# Patient Record
Sex: Male | Born: 1945
Health system: Southern US, Community
[De-identification: ages and names within clinical notes are randomized; demographics above are authoritative.]

## PROBLEM LIST (undated history)

## (undated) DIAGNOSIS — E785 Hyperlipidemia, unspecified: Secondary | ICD-10-CM

## (undated) DIAGNOSIS — T7840XA Allergy, unspecified, initial encounter: Secondary | ICD-10-CM

## (undated) DIAGNOSIS — L570 Actinic keratosis: Secondary | ICD-10-CM

## (undated) DIAGNOSIS — C801 Malignant (primary) neoplasm, unspecified: Secondary | ICD-10-CM

## (undated) DIAGNOSIS — C61 Malignant neoplasm of prostate: Secondary | ICD-10-CM

## (undated) DIAGNOSIS — L719 Rosacea, unspecified: Secondary | ICD-10-CM

## (undated) DIAGNOSIS — K5730 Diverticulosis of large intestine without perforation or abscess without bleeding: Secondary | ICD-10-CM

## (undated) DIAGNOSIS — Z9289 Personal history of other medical treatment: Secondary | ICD-10-CM

## (undated) DIAGNOSIS — M25561 Pain in right knee: Secondary | ICD-10-CM

## (undated) DIAGNOSIS — K219 Gastro-esophageal reflux disease without esophagitis: Secondary | ICD-10-CM

## (undated) DIAGNOSIS — H269 Unspecified cataract: Secondary | ICD-10-CM

## (undated) HISTORY — DX: Diverticulosis of large intestine without perforation or abscess without bleeding: K57.30

## (undated) HISTORY — PX: RADIOACTIVE SEED IMPLANT: SHX5150

## (undated) HISTORY — PX: COLONOSCOPY: SHX174

## (undated) HISTORY — DX: Rosacea, unspecified: L71.9

## (undated) HISTORY — PX: POLYPECTOMY: SHX149

## (undated) HISTORY — DX: Gastro-esophageal reflux disease without esophagitis: K21.9

## (undated) HISTORY — DX: Hyperlipidemia, unspecified: E78.5

## (undated) HISTORY — PX: APPENDECTOMY: SHX54

## (undated) HISTORY — PX: TONSILLECTOMY: SUR1361

## (undated) HISTORY — DX: Unspecified cataract: H26.9

## (undated) HISTORY — DX: Allergy, unspecified, initial encounter: T78.40XA

## (undated) HISTORY — DX: Actinic keratosis: L57.0

## (undated) HISTORY — DX: Pain in right knee: M25.561

## (undated) HISTORY — DX: Malignant neoplasm of prostate: C61

## (undated) HISTORY — PX: CATARACT EXTRACTION: SUR2

## (undated) HISTORY — DX: Personal history of other medical treatment: Z92.89

---

## 1984-09-24 HISTORY — PX: LUMBAR SPINE SURGERY: SHX701

## 1985-04-24 DIAGNOSIS — Z9289 Personal history of other medical treatment: Secondary | ICD-10-CM

## 1985-04-24 HISTORY — DX: Personal history of other medical treatment: Z92.89

## 1994-06-08 ENCOUNTER — Encounter: Payer: Self-pay | Admitting: Family Medicine

## 1994-06-08 LAB — CONVERTED CEMR LAB
Blood Glucose, Fasting: 85 mg/dL
RBC count: 5.32 10*6/uL
WBC, blood: 5.7 10*3/uL

## 1995-02-07 ENCOUNTER — Encounter: Payer: Self-pay | Admitting: Family Medicine

## 1995-02-07 LAB — CONVERTED CEMR LAB: PSA: 0.7 ng/mL

## 1997-01-27 ENCOUNTER — Encounter: Payer: Self-pay | Admitting: Family Medicine

## 1997-01-27 LAB — CONVERTED CEMR LAB: Blood Glucose, Fasting: 97 mg/dL

## 1999-01-23 DIAGNOSIS — K5730 Diverticulosis of large intestine without perforation or abscess without bleeding: Secondary | ICD-10-CM

## 1999-01-23 HISTORY — DX: Diverticulosis of large intestine without perforation or abscess without bleeding: K57.30

## 1999-01-26 ENCOUNTER — Encounter: Payer: Self-pay | Admitting: Family Medicine

## 1999-01-26 LAB — CONVERTED CEMR LAB: Blood Glucose, Fasting: 100 mg/dL

## 2000-05-24 ENCOUNTER — Encounter: Payer: Self-pay | Admitting: Family Medicine

## 2000-05-24 LAB — CONVERTED CEMR LAB
Blood Glucose, Fasting: 101 mg/dL
PSA: 0.8 ng/mL

## 2000-11-14 ENCOUNTER — Encounter: Payer: Self-pay | Admitting: Family Medicine

## 2000-11-14 LAB — CONVERTED CEMR LAB
Blood Glucose, Fasting: 88 mg/dL
RBC count: 4.82 10*6/uL
TSH: 1.64 microintl units/mL
WBC, blood: 5.7 10*3/uL

## 2001-05-01 ENCOUNTER — Encounter: Payer: Self-pay | Admitting: Family Medicine

## 2001-05-01 LAB — CONVERTED CEMR LAB
Blood Glucose, Fasting: 95 mg/dL
PSA: 0.7 ng/mL

## 2002-05-20 ENCOUNTER — Encounter: Payer: Self-pay | Admitting: Family Medicine

## 2002-05-20 LAB — CONVERTED CEMR LAB
Blood Glucose, Fasting: 102 mg/dL
PSA: 0.6 ng/mL
RBC count: 5.09 10*6/uL
TSH: 1.52 microintl units/mL
WBC, blood: 4.9 10*3/uL

## 2003-05-21 ENCOUNTER — Encounter: Payer: Self-pay | Admitting: Family Medicine

## 2003-05-21 LAB — CONVERTED CEMR LAB
Blood Glucose, Fasting: 101 mg/dL
PSA: 0.7 ng/mL
TSH: 1.36 microintl units/mL

## 2004-08-28 ENCOUNTER — Ambulatory Visit: Payer: Self-pay | Admitting: Family Medicine

## 2004-08-28 LAB — CONVERTED CEMR LAB
Blood Glucose, Fasting: 99 mg/dL
PSA: 0.81 ng/mL
TSH: 1.23 microintl units/mL

## 2004-08-30 ENCOUNTER — Ambulatory Visit: Payer: Self-pay | Admitting: Family Medicine

## 2005-04-25 ENCOUNTER — Ambulatory Visit: Payer: Self-pay | Admitting: Family Medicine

## 2005-07-03 ENCOUNTER — Ambulatory Visit: Payer: Self-pay | Admitting: Family Medicine

## 2005-09-03 ENCOUNTER — Ambulatory Visit: Payer: Self-pay | Admitting: Family Medicine

## 2005-09-03 LAB — CONVERTED CEMR LAB
Blood Glucose, Fasting: 95 mg/dL
PSA: 1.06 ng/mL
RBC count: 4.99 10*6/uL
TSH: 0.92 microintl units/mL
WBC, blood: 5.8 10*3/uL

## 2005-09-05 ENCOUNTER — Ambulatory Visit: Payer: Self-pay | Admitting: Family Medicine

## 2005-09-20 ENCOUNTER — Ambulatory Visit: Payer: Self-pay | Admitting: Family Medicine

## 2005-10-05 ENCOUNTER — Ambulatory Visit (HOSPITAL_BASED_OUTPATIENT_CLINIC_OR_DEPARTMENT_OTHER): Admission: RE | Admit: 2005-10-05 | Discharge: 2005-10-05 | Payer: Self-pay | Admitting: Surgery

## 2005-10-05 HISTORY — PX: UMBILICAL HERNIA REPAIR: SHX196

## 2005-10-15 ENCOUNTER — Ambulatory Visit: Payer: Self-pay | Admitting: Family Medicine

## 2006-09-06 ENCOUNTER — Ambulatory Visit: Payer: Self-pay | Admitting: Family Medicine

## 2006-09-06 LAB — CONVERTED CEMR LAB
Blood Glucose, Fasting: 96 mg/dL
PSA: 0.93 ng/mL
TSH: 1.6 microintl units/mL

## 2006-09-11 ENCOUNTER — Ambulatory Visit: Payer: Self-pay | Admitting: Family Medicine

## 2006-09-23 ENCOUNTER — Ambulatory Visit: Payer: Self-pay

## 2006-09-26 ENCOUNTER — Ambulatory Visit: Payer: Self-pay | Admitting: Family Medicine

## 2006-11-15 ENCOUNTER — Ambulatory Visit: Payer: Self-pay | Admitting: Family Medicine

## 2007-09-05 ENCOUNTER — Encounter: Payer: Self-pay | Admitting: Family Medicine

## 2007-09-05 DIAGNOSIS — L719 Rosacea, unspecified: Secondary | ICD-10-CM

## 2007-09-05 DIAGNOSIS — E7800 Pure hypercholesterolemia, unspecified: Secondary | ICD-10-CM

## 2007-09-08 ENCOUNTER — Ambulatory Visit: Payer: Self-pay | Admitting: Family Medicine

## 2007-09-08 DIAGNOSIS — K5730 Diverticulosis of large intestine without perforation or abscess without bleeding: Secondary | ICD-10-CM | POA: Insufficient documentation

## 2007-09-08 LAB — CONVERTED CEMR LAB
ALT: 26 units/L (ref 0–53)
AST: 19 units/L (ref 0–37)
Albumin: 3.9 g/dL (ref 3.5–5.2)
Alkaline Phosphatase: 75 units/L (ref 39–117)
BUN: 12 mg/dL (ref 6–23)
Bilirubin, Direct: 0.1 mg/dL (ref 0.0–0.3)
CO2: 29 meq/L (ref 19–32)
Calcium: 9.3 mg/dL (ref 8.4–10.5)
Chloride: 103 meq/L (ref 96–112)
Cholesterol: 224 mg/dL (ref 0–200)
Creatinine, Ser: 0.8 mg/dL (ref 0.4–1.5)
Direct LDL: 158.5 mg/dL
GFR calc Af Amer: 127 mL/min
GFR calc non Af Amer: 105 mL/min
Glucose, Bld: 96 mg/dL (ref 70–99)
HDL: 47.7 mg/dL (ref >39.0)
PSA: 1.11 ng/mL (ref 0.10–4.00)
Potassium: 4 meq/L (ref 3.5–5.1)
Sodium: 140 meq/L (ref 135–145)
TSH: 1.57 microintl units/mL (ref 0.35–5.50)
Total Bilirubin: 1.1 mg/dL (ref 0.3–1.2)
Total CHOL/HDL Ratio: 4.7
Total Protein: 6.5 g/dL (ref 6.0–8.3)
Triglycerides: 86 mg/dL (ref 0–149)
VLDL: 17 mg/dL (ref 0–40)

## 2007-09-15 ENCOUNTER — Ambulatory Visit: Payer: Self-pay | Admitting: Family Medicine

## 2007-12-23 ENCOUNTER — Ambulatory Visit: Payer: Self-pay | Admitting: Family Medicine

## 2007-12-23 LAB — CONVERTED CEMR LAB
OCCULT 1: NEGATIVE
OCCULT 2: NEGATIVE
OCCULT 3: NEGATIVE

## 2007-12-23 LAB — FECAL OCCULT BLOOD, GUAIAC: Fecal Occult Blood: NEGATIVE

## 2007-12-24 ENCOUNTER — Encounter (INDEPENDENT_AMBULATORY_CARE_PROVIDER_SITE_OTHER): Payer: Self-pay | Admitting: *Deleted

## 2008-03-30 ENCOUNTER — Ambulatory Visit: Payer: Self-pay | Admitting: Family Medicine

## 2008-05-10 ENCOUNTER — Encounter: Payer: Self-pay | Admitting: Family Medicine

## 2008-07-07 ENCOUNTER — Ambulatory Visit: Payer: Self-pay | Admitting: Family Medicine

## 2008-09-13 ENCOUNTER — Ambulatory Visit: Payer: Self-pay | Admitting: Family Medicine

## 2008-09-13 LAB — CONVERTED CEMR LAB
ALT: 29 units/L (ref 0–53)
AST: 21 units/L (ref 0–37)
Albumin: 4 g/dL (ref 3.5–5.2)
Alkaline Phosphatase: 82 units/L (ref 39–117)
BUN: 14 mg/dL (ref 6–23)
Basophils Absolute: 0 10*3/uL (ref 0.0–0.1)
Basophils Relative: 0.7 % (ref 0.0–3.0)
Bilirubin, Direct: 0.1 mg/dL (ref 0.0–0.3)
CO2: 31 meq/L (ref 19–32)
Calcium: 9.1 mg/dL (ref 8.4–10.5)
Chloride: 105 meq/L (ref 96–112)
Cholesterol: 222 mg/dL (ref 0–200)
Creatinine, Ser: 0.8 mg/dL (ref 0.4–1.5)
Direct LDL: 158.3 mg/dL
Eosinophils Absolute: 0.1 10*3/uL (ref 0.0–0.7)
Eosinophils Relative: 1.7 % (ref 0.0–5.0)
GFR calc Af Amer: 126 mL/min
GFR calc non Af Amer: 104 mL/min
Glucose, Bld: 105 mg/dL — ABNORMAL HIGH (ref 70–99)
HCT: 47.2 % (ref 39.0–52.0)
HDL: 52.1 mg/dL (ref >39.0)
Hemoglobin: 16.5 g/dL (ref 13.0–17.0)
Lymphocytes Relative: 21 % (ref 12.0–46.0)
MCHC: 35 g/dL (ref 30.0–36.0)
MCV: 96.4 fL (ref 78.0–100.0)
Monocytes Absolute: 0.6 10*3/uL (ref 0.1–1.0)
Monocytes Relative: 9.2 % (ref 3.0–12.0)
Neutro Abs: 4.2 10*3/uL (ref 1.4–7.7)
Neutrophils Relative %: 67.4 % (ref 43.0–77.0)
PSA: 1.16 ng/mL (ref 0.10–4.00)
Platelets: 156 10*3/uL (ref 150–400)
Potassium: 4.3 meq/L (ref 3.5–5.1)
RBC: 4.9 M/uL (ref 4.22–5.81)
RDW: 11.9 % (ref 11.5–14.6)
Sodium: 140 meq/L (ref 135–145)
TSH: 1.5 microintl units/mL (ref 0.35–5.50)
Total Bilirubin: 1 mg/dL (ref 0.3–1.2)
Total CHOL/HDL Ratio: 4.3
Total Protein: 6.5 g/dL (ref 6.0–8.3)
Triglycerides: 70 mg/dL (ref 0–149)
VLDL: 14 mg/dL (ref 0–40)
WBC: 6.2 10*3/uL (ref 4.5–10.5)

## 2008-09-22 ENCOUNTER — Ambulatory Visit: Payer: Self-pay | Admitting: Family Medicine

## 2008-09-22 DIAGNOSIS — R7309 Other abnormal glucose: Secondary | ICD-10-CM | POA: Insufficient documentation

## 2008-10-25 ENCOUNTER — Ambulatory Visit: Payer: Self-pay | Admitting: Family Medicine

## 2008-11-04 ENCOUNTER — Ambulatory Visit: Payer: Self-pay | Admitting: Family Medicine

## 2008-11-04 LAB — CONVERTED CEMR LAB
ALT: 25 units/L (ref 0–53)
AST: 18 units/L (ref 0–37)

## 2008-12-15 ENCOUNTER — Ambulatory Visit: Payer: Self-pay | Admitting: Family Medicine

## 2008-12-15 LAB — CONVERTED CEMR LAB
ALT: 23 units/L (ref 0–53)
AST: 18 units/L (ref 0–37)
Cholesterol: 160 mg/dL (ref 0–200)
HDL: 44.6 mg/dL (ref >39.00)
LDL Cholesterol: 100 mg/dL — ABNORMAL HIGH (ref 0–99)
Total CHOL/HDL Ratio: 4
Triglycerides: 79 mg/dL (ref 0.0–149.0)
VLDL: 15.8 mg/dL (ref 0.0–40.0)

## 2008-12-22 ENCOUNTER — Ambulatory Visit: Payer: Self-pay | Admitting: Family Medicine

## 2009-03-24 ENCOUNTER — Ambulatory Visit: Payer: Self-pay | Admitting: Family Medicine

## 2009-04-04 ENCOUNTER — Encounter (INDEPENDENT_AMBULATORY_CARE_PROVIDER_SITE_OTHER): Payer: Self-pay | Admitting: *Deleted

## 2009-05-16 ENCOUNTER — Ambulatory Visit: Payer: Self-pay | Admitting: Gastroenterology

## 2009-06-01 ENCOUNTER — Ambulatory Visit: Payer: Self-pay | Admitting: Gastroenterology

## 2009-06-01 LAB — HM COLONOSCOPY

## 2009-07-01 ENCOUNTER — Telehealth: Payer: Self-pay | Admitting: Family Medicine

## 2009-09-13 ENCOUNTER — Telehealth: Payer: Self-pay | Admitting: Family Medicine

## 2009-09-14 ENCOUNTER — Ambulatory Visit: Payer: Self-pay | Admitting: Family Medicine

## 2009-09-14 LAB — CONVERTED CEMR LAB
ALT: 23 units/L (ref 0–53)
AST: 16 units/L (ref 0–37)
Albumin: 4.2 g/dL (ref 3.5–5.2)
Alkaline Phosphatase: 94 units/L (ref 39–117)
BUN: 13 mg/dL (ref 6–23)
Basophils Absolute: 0 10*3/uL (ref 0.0–0.1)
Basophils Relative: 0.7 % (ref 0.0–3.0)
Bilirubin, Direct: 0 mg/dL (ref 0.0–0.3)
CO2: 31 meq/L (ref 19–32)
Calcium: 9.3 mg/dL (ref 8.4–10.5)
Chloride: 103 meq/L (ref 96–112)
Cholesterol: 167 mg/dL (ref 0–200)
Creatinine, Ser: 0.9 mg/dL (ref 0.4–1.5)
Creatinine,U: 254.5 mg/dL
Eosinophils Absolute: 0.1 10*3/uL (ref 0.0–0.7)
Eosinophils Relative: 1.9 % (ref 0.0–5.0)
GFR calc non Af Amer: 90.74 mL/min (ref >60)
Glucose, Bld: 97 mg/dL (ref 70–99)
HCT: 49.3 % (ref 39.0–52.0)
HDL: 53.3 mg/dL (ref >39.00)
Hemoglobin: 17 g/dL (ref 13.0–17.0)
LDL Cholesterol: 96 mg/dL (ref 0–99)
Lymphocytes Relative: 26.4 % (ref 12.0–46.0)
Lymphs Abs: 1.6 10*3/uL (ref 0.7–4.0)
MCHC: 34.4 g/dL (ref 30.0–36.0)
MCV: 99.2 fL (ref 78.0–100.0)
Microalb Creat Ratio: 3.1 mg/g (ref 0.0–30.0)
Microalb, Ur: 0.8 mg/dL (ref 0.0–1.9)
Monocytes Absolute: 0.5 10*3/uL (ref 0.1–1.0)
Monocytes Relative: 8.1 % (ref 3.0–12.0)
Neutro Abs: 3.7 10*3/uL (ref 1.4–7.7)
Neutrophils Relative %: 62.9 % (ref 43.0–77.0)
PSA: 1.36 ng/mL (ref 0.10–4.00)
Platelets: 172 10*3/uL (ref 150.0–400.0)
Potassium: 4 meq/L (ref 3.5–5.1)
RBC: 4.97 M/uL (ref 4.22–5.81)
RDW: 11.9 % (ref 11.5–14.6)
Sodium: 140 meq/L (ref 135–145)
Total Bilirubin: 0.9 mg/dL (ref 0.3–1.2)
Total CHOL/HDL Ratio: 3
Total Protein: 6.8 g/dL (ref 6.0–8.3)
Triglycerides: 91 mg/dL (ref 0.0–149.0)
VLDL: 18.2 mg/dL (ref 0.0–40.0)
WBC: 5.9 10*3/uL (ref 4.5–10.5)

## 2009-09-15 LAB — CONVERTED CEMR LAB: Vit D, 25-Hydroxy: 13 ng/mL — ABNORMAL LOW (ref 30–89)

## 2009-10-26 ENCOUNTER — Ambulatory Visit: Payer: Self-pay | Admitting: Family Medicine

## 2009-10-26 DIAGNOSIS — E559 Vitamin D deficiency, unspecified: Secondary | ICD-10-CM | POA: Insufficient documentation

## 2010-01-17 ENCOUNTER — Ambulatory Visit: Payer: Self-pay | Admitting: Family Medicine

## 2010-01-23 ENCOUNTER — Ambulatory Visit: Payer: Self-pay | Admitting: Family Medicine

## 2010-04-26 ENCOUNTER — Encounter (INDEPENDENT_AMBULATORY_CARE_PROVIDER_SITE_OTHER): Payer: Self-pay | Admitting: *Deleted

## 2010-07-11 ENCOUNTER — Ambulatory Visit: Payer: Self-pay | Admitting: Family Medicine

## 2010-07-19 ENCOUNTER — Ambulatory Visit: Payer: Self-pay | Admitting: Family Medicine

## 2010-07-19 DIAGNOSIS — M25569 Pain in unspecified knee: Secondary | ICD-10-CM

## 2010-10-24 NOTE — Assessment & Plan Note (Signed)
Summary: CPX/CLE   Vital Signs:  Patient profile:   65 year old male Height:      68 inches Weight:      189 pounds BMI:     28.84 Temp:     97.8 degrees F oral Pulse rate:   76 / minute Pulse rhythm:   regular BP sitting:   130 / 90  (left arm) Cuff size:   regular  Vitals Entered By: Sydell Axon LPN November 13, 2009 2:45 PM) CC: 30 Minute checkup, had a colonoscopy 09/10 by Dr. Arlyce Dice   History of Present Illness: Pt here for Comp Exam, has no complaints.   Preventive Screening-Counseling & Management  Alcohol-Tobacco     Alcohol drinks/day: 0     Smoking Status: never     Passive Smoke Exposure: no  Caffeine-Diet-Exercise     Caffeine use/day: 1     Does Patient Exercise: yes     Type of exercise: weights/walks [facility]     Times/week: 2  Problems Prior to Update: 1)  Hyperglycemia  (ICD-790.29) 2)  Lumbar Strain  (ICD-847.2) 3)  Special Screening Malig Neoplasms Other Sites  (ICD-V76.49) 4)  Special Screening Malignant Neoplasm of Prostate  (ICD-V76.44) 5)  Health Maintenance Exam  (ICD-V70.0) 6)  Rosacea  (ICD-695.3) 7)  Hypercholesterolemia  (ICD-272.0) 8)  Diverticulosis, Colon  (ICD-562.10)  Medications Prior to Update: 1)  Multivitamins  Tabs (Multiple Vitamin) .Marland Kitchen.. 1 Daily By Mouth 2)  Aleve 220 Mg Tabs (Naproxen Sodium) .... As Instructed At Urgent Care 3)  Pravachol 80 Mg Tabs (Pravastatin Sodium) .... One Tab By Mouth At Night 4)  Metronidazole 0.75 % Gel (Metronidazole) .... Apply To Affected Area Daily As Needed 5)  Transderm-Scop 1.5 Mg Pt72 (Scopolamine Base) .... Apply Patch Every 3 Days As Needed For Motion Sickness  Allergies: No Known Drug Allergies  Past History:  Past Medical History: Last updated: 09/05/2007 Diverticulosis, colon:(01/1999)  Past Surgical History: Last updated: 06/01/2009 L5 RUPTURED DISC REPAIR 1986 COLONOSCOPY: 2ND TO FAMILY HISTORY OF COLON CANCER:: DIVERTICS/ H/O POLYPS:(01/1999) CT L/S , H/P  L5/S1:(04/1985) COLONOSCOPY , POLYP B-9, DIVERTICS (04/2004) REPEAT IN 5 YEARS [facility]MB. HERNIA REPAIR:(10/05/2005) STRESS MYOVIEW: NORMAL:(09/23/2006) COLONOSCOPY DIVERTICS ([facility]) (06/01/2009)  REPEAT IN 5 YRS  Family History: Last updated: 11/13/09 Father: DECEASED 71 YOA , COLON CANCER Mother: dec 89 ALZHEIMERS BROTHER 26  Blood Clots/PE SISTER 57 SISTER 56 HTN,DM,P.E., MI CABG X 3 SISTER 47 CV: +CAD FATHER HBP: + FATHER, + SECOND SISTER DM: + FATHER, SECOND SISTER GOUT/ARTHRITIS: PROSTATE CANCER:\par BREAST/OVARIAN/UTERINE CANCER: COLON CANCER:+ FATHER DEPRESSION: NEGATIVE ETOH/DRUG ABUSE: NEGATIVE OTHER: NEGATIVE STROKE   Social History: Last updated: 09/22/2008 Marital Status: Married/ LIVES WITH WIFE Children: NO CHILDREN 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   Risk Factors: Alcohol Use: 0 (2009/11/13) Caffeine Use: 1 (Nov 13, 2009) Exercise: yes (11-13-2009)  Risk Factors: Smoking Status: never (November 13, 2009) Passive Smoke Exposure: no (13-Nov-2009)  Family History: Father: DECEASED 3 YOA , COLON CANCER Mother: dec 89 ALZHEIMERS BROTHER 70  Blood Clots/PE SISTER 57 SISTER 56 HTN,DM,P.E., MI CABG X 3 SISTER 47 CV: +CAD FATHER HBP: + FATHER, + SECOND SISTER DM: + FATHER, SECOND SISTER GOUT/ARTHRITIS: PROSTATE CANCER:\par BREAST/OVARIAN/UTERINE CANCER: COLON CANCER:+ FATHER DEPRESSION: NEGATIVE ETOH/DRUG ABUSE: NEGATIVE OTHER: NEGATIVE STROKE   Review of Systems General:  Denies chills, fatigue, fever, sweats, weakness, and weight loss. Eyes:  Denies blurring, discharge, and eye pain. ENT:  Denies decreased hearing, ear discharge, earache, and ringing in ears. CV:  Denies chest pain or discomfort, fainting,  fatigue, palpitations, shortness of breath with exertion, and swelling of feet. Resp:  Denies cough, shortness of breath, and wheezing. GI:  Complains of diarrhea; denies abdominal pain, bloody stools, change in bowel habits, constipation, dark  tarry stools, indigestion, loss of appetite, nausea, vomiting, vomiting blood, and yellowish skin color; occas. GU:  Denies discharge, dysuria, nocturia, and urinary frequency. MS:  Denies joint pain, joint redness, low back pain, muscle aches, cramps, and stiffness. Derm:  Denies dryness, flushing, itching, and rash. Neuro:  Denies numbness, poor balance, tingling, and tremors.  Physical Exam  General:  Well-developed,well-nourished,in no acute distress; alert,appropriate and cooperative throughout examination Head:  Normocephalic and atraumatic without obvious abnormalities. No apparent alopecia or balding. Sinuses NT. Eyes:  Conjunctiva clear bilaterally.  Ears:  External ear exam shows no significant lesions or deformities.  Otoscopic examination reveals clear canals, tympanic membranes are intact bilaterally without bulging, retraction, inflammation or discharge. Hearing is grossly normal bilaterally. Nose:  External nasal examination shows no deformity or inflammation. Nasal mucosa are pink and moist without lesions or exudates. Mouth:  Oral mucosa and oropharynx without lesions or exudates.  Teeth in good repair. Neck:  No deformities, masses, or tenderness noted. Chest Wall:  No deformities, masses, tenderness or gynecomastia noted. Breasts:  No masses or gynecomastia noted Lungs:  Normal respiratory effort, chest expands symmetrically. Lungs are clear to auscultation, no crackles or wheezes. Heart:  Normal rate and regular rhythm. S1 and S2 normal without gallop, murmur, click, rub or other extra sounds. Abdomen:  Bowel sounds positive,abdomen soft and non-tender without masses, organomegaly or hernias noted. Rectal:  No external abnormalities noted. Normal sphincter tone. No rectal masses or tenderness. G neg. Genitalia:  Testes bilaterally descended without nodularity, tenderness or masses. No scrotal masses or lesions. No penis lesions or urethral discharge. Prostate:  Prostate  gland firm and smooth, no enlargement, nodularity, tenderness, mass, asymmetry or induration. 10-20gms. Msk:  No deformity or scoliosis noted of thoracic or lumbar spine.   Pulses:  R and L carotid,radial,femoral,dorsalis pedis and posterior tibial pulses are full and equal bilaterally Extremities:  No clubbing, cyanosis, edema, or deformity noted with normal full range of motion of all joints.   Neurologic:  No cranial nerve deficits noted. Station and gait are normal. Sensory, motor and coordinative functions appear intact. Skin:  Intact without suspicious lesions or rashes Cervical Nodes:  No lymphadenopathy noted Inguinal Nodes:  No significant adenopathy Psych:  Cognition and judgment appear intact. Alert and cooperative with normal attention span and concentration. No apparent delusions, illusions, hallucinations   Impression & Recommendations:  Problem # 1:  HEALTH MAINTENANCE EXAM (ICD-V70.0) Assessment Comment Only  Problem # 2:  SPECIAL SCREENING MALIGNANT NEOPLASM OF PROSTATE (ICD-V76.44) Assessment: Unchanged Stable exam and PSA.  Problem # 3:  ROSACEA (ICD-695.3) Assessment: Unchanged Cont Metrogel as needed.  Problem # 4:  HYPERCHOLESTEROLEMIA (ICD-272.0) Assessment: Unchanged Nos acceptable, cont Prava. His updated medication list for this problem includes:    Pravachol 80 Mg Tabs (Pravastatin sodium) ..... One tab by mouth at night  Labs Reviewed: SGOT: 16 (09/14/2009)   SGPT: 23 (09/14/2009)   HDL:53.30 (09/14/2009), 44.60 (12/15/2008)  LDL:96 (09/14/2009), 100 (16/09/930)  Chol:167 (09/14/2009), 160 (12/15/2008)  Trig:91.0 (09/14/2009), 79.0 (12/15/2008)  Problem # 5:  DIVERTICULOSIS, COLON (ICD-562.10) Assessment: Unchanged Discussed being seen for prolonged LLQ discomfort. Colonoscopy:  Labs Reviewed: Hgb: 17.0 (09/14/2009)   Hct: 49.3 (09/14/2009)   WBC: 5.9 (09/14/2009)  Problem # 6:  VITAMIN D DEFICIENCY (ICD-268.9) Assessment: New Start  50000IU  replacement. Recheck in 3 mos.  Complete Medication List: 1)  Multivitamins Tabs (Multiple vitamin) .Marland Kitchen.. 1 daily by mouth 2)  Aleve 220 Mg Tabs (Naproxen sodium) .... As instructed at urgent care 3)  Pravachol 80 Mg Tabs (Pravastatin sodium) .... One tab by mouth at night 4)  Metronidazole 0.75 % Gel (Metronidazole) .... Apply to affected area daily as needed 5)  Vitamin D (ergocalciferol) 50000 Unit Caps (Ergocalciferol) .... One tab by mouth weekly  Patient Instructions: 1)  RTC 3 mos , Vit D prior 268.9 Prescriptions: VITAMIN D (ERGOCALCIFEROL) 50000 UNIT CAPS (ERGOCALCIFEROL) one tab by mouth weekly  #4 x 3   Entered and Authorized by:   Shaune Leeks MD   Signed by:   Shaune Leeks MD on 10/26/2009   Method used:   Print then Give to Patient   RxID:   [id number]   Current Allergies (reviewed today): No known allergies

## 2010-10-24 NOTE — Letter (Signed)
Summary: [name] letter  [name] at [facility]  [address] [place], [place] [address]   Phone: [phone]  Fax: [phone]       04/26/2010 MRN: [id number]  Shane Stewart [address] [place], [place]  [address]  Dear Mr. [name] [place], and [facility] announce the retirement of Arta Silence, M.D., from full-time practice at the [facility] office effective March 23, 2010 and his plans of returning part-time.  It is important to Dr. Hetty Ely and to our practice that you understand that [facility] has seven physicians in our office for your health care needs.  We will continue to offer the same exceptional care that you have today.    Dr. Hetty Ely has spoken to many of you about his plans for retirement and returning part-time in the fall.   We will continue to work with you through the transition to schedule appointments for you in the office and meet the high standards that [facility] is committed to.   Again, it is with great pleasure that we share the news that Dr. Hetty Ely will return to [facility] at [facility] in October of 2011 with a reduced schedule.    If you have any questions, or would like to request an appointment with one of our physicians, please call us at [phone] and press the option for Scheduling an appointment.  We take pleasure in providing you with excellent patient care and look forward to seeing you at your next office visit.  Our [facility] Physicians are:  Tillman Abide, M.D. Laurita Quint, M.D. Roxy Manns, M.D. Kerby Nora, M.D. Hannah Beat, M.D. Ruthe Mannan, M.D. We proudly welcomed Raechel Ache, M.D. and Eustaquio Boyden, M.D. to the practice in July/August 2011.  Sincerely,  [facility]

## 2010-10-24 NOTE — Assessment & Plan Note (Signed)
Summary: follow-up on lab work   Vital Signs:  Patient profile:   65 year old male Weight:      187.50 pounds Temp:     97.0 degrees F oral Pulse rate:   72 / minute Pulse rhythm:   regular BP sitting:   120 / 76  (left arm)  Vitals Entered By: Sydell Axon LPN (July 19, 2010 11:42 AM) CC: Follow-up on lab work   History of Present Illness: Pt here for recheck. He has gone from 50000Iu Vit D to 1000Iu two times a day. He is tolerating the meds fine w/o difficulty. He is also having some right knee pain. He had had left knee pain in the past with swinging a golf club and saw an orthopedist and was told to do leg raises, which he continues to do. He has no other problems.  Problems Prior to Update: 1)  Vitamin D Deficiency  (ICD-268.9) 2)  Hyperglycemia  (ICD-790.29) 3)  Special Screening Malig Neoplasms Other Sites  (ICD-V76.49) 4)  Special Screening Malignant Neoplasm of Prostate  (ICD-V76.44) 5)  Health Maintenance Exam  (ICD-V70.0) 6)  Rosacea  (ICD-695.3) 7)  Hypercholesterolemia  (ICD-272.0) 8)  Diverticulosis, Colon  (ICD-562.10)  Medications Prior to Update: 1)  Multivitamins  Tabs (Multiple Vitamin) .Marland Kitchen.. 1 Daily By Mouth 2)  Aleve 220 Mg Tabs (Naproxen Sodium) .... As Instructed At Urgent Care 3)  Pravachol 80 Mg Tabs (Pravastatin Sodium) .... One Tab By Mouth At Night 4)  Metronidazole 0.75 % Gel (Metronidazole) .... Apply To Affected Area Daily As Needed 5)  Vitamin D 1000 Unit Tabs (Cholecalciferol) .... One Tab By Mouth Two Times A Day  Allergies: No Known Drug Allergies  Physical Exam  General:  Well-developed,well-nourished,in no acute distress; alert,appropriate and cooperative throughout examination Head:  Normocephalic and atraumatic without obvious abnormalities. No apparent alopecia or balding. Sinuses NT. Eyes:  Conjunctiva clear bilaterally.  Ears:  External ear exam shows no significant lesions or deformities.  Otoscopic examination reveals clear  canals, tympanic membranes are intact bilaterally without bulging, retraction, inflammation or discharge. Hearing is grossly normal bilaterally. Nose:  External nasal examination shows no deformity or inflammation. Nasal mucosa are pink and moist without lesions or exudates. Mouth:  Oral mucosa and oropharynx without lesions or exudates.  Teeth in good repair. Neck:  No deformities, masses, or tenderness noted. Chest Wall:  No deformities, masses, tenderness or gynecomastia noted. Lungs:  Normal respiratory effort, chest expands symmetrically. Lungs are clear to auscultation, no crackles or wheezes. Heart:  Normal rate and regular rhythm. S1 and S2 normal without gallop, murmur, click, rub or other extra sounds. Extremities:  R knee with pain over the ant tibial plateau area, esp with stepping down.   Impression & Recommendations:  Problem # 1:  VITAMIN D DEFICIENCY (ICD-268.9) Assessment Unchanged Stable on 1000Iu two times a day but would try going to three times a day or 2 in AM and one in PM,  Problem # 2:  KNEE PAIN, RIGHT (ZOX-096.04) Assessment: New  Try Hyaluronic Acid daily. His updated medication list for this problem includes:    Aleve 220 Mg Tabs (Naproxen sodium) .Marland Kitchen... As instructed at urgent care  Discussed strengthening exercises, use of ice or heat, and medications.   Complete Medication List: 1)  Multivitamins Tabs (Multiple vitamin) .Marland Kitchen.. 1 daily by mouth 2)  Aleve 220 Mg Tabs (Naproxen sodium) .... As instructed at urgent care 3)  Pravachol 80 Mg Tabs (Pravastatin sodium) .... One tab  by mouth at night 4)  Metronidazole 0.75 % Gel (Metronidazole) .... Apply to affected area daily as needed 5)  Vitamin D 1000 Unit Tabs (Cholecalciferol) .... One tab by mouth two times a day  Other Orders: Admin 1st Vaccine (16109) Flu Vaccine 40yrs + (60454)  Patient Instructions: 1)  RTC as scheduled.   Orders Added: 1)  Admin 1st Vaccine [90471] 2)  Flu Vaccine 57yrs  + [90658] 3)  Est. Patient Level III [09811]    Current Allergies (reviewed today): No known allergies   Flu Vaccine Consent Questions     Do you have a history of severe allergic reactions to this vaccine? no    Any prior history of allergic reactions to egg and/or gelatin? no    Do you have a sensitivity to the preservative Thimersol? no    Do you have a past history of Guillan-Barre Syndrome? no    Do you currently have an acute febrile illness? no    Have you ever had a severe reaction to latex? no    Vaccine information given and explained to patient? yes    Are you currently pregnant? no    Lot Number:[id number]   Exp Date:03/24/2011   Site Given  Left Deltoid [email or web address]

## 2010-10-24 NOTE — Assessment & Plan Note (Signed)
Summary: 3 M F/U DLO   Vital Signs:  Patient profile:   65 year old male Weight:      190.25 pounds Temp:     98.5 degrees F oral Pulse rate:   68 / minute Pulse rhythm:   regular BP sitting:   120 / 80  (left arm) Cuff size:   regular  Vitals Entered By: Sydell Axon LPN (Jan 24, 6044 11:51 AM) CC: 3 Month follow-up   History of Present Illness: Pt here for followup of Vit D. Was put on 50000Iu last time weekly and today is therapeutic. He also is aware of feeling better. He has tolerated the medicatiopn without problem.  Problems Prior to Update: 1)  Vitamin D Deficiency  (ICD-268.9) 2)  Hyperglycemia  (ICD-790.29) 3)  Special Screening Malig Neoplasms Other Sites  (ICD-V76.49) 4)  Special Screening Malignant Neoplasm of Prostate  (ICD-V76.44) 5)  Health Maintenance Exam  (ICD-V70.0) 6)  Rosacea  (ICD-695.3) 7)  Hypercholesterolemia  (ICD-272.0) 8)  Diverticulosis, Colon  (ICD-562.10)  Medications Prior to Update: 1)  Multivitamins  Tabs (Multiple Vitamin) .Marland Kitchen.. 1 Daily By Mouth 2)  Aleve 220 Mg Tabs (Naproxen Sodium) .... As Instructed At Urgent Care 3)  Pravachol 80 Mg Tabs (Pravastatin Sodium) .... One Tab By Mouth At Night 4)  Metronidazole 0.75 % Gel (Metronidazole) .... Apply To Affected Area Daily As Needed 5)  Vitamin D (Ergocalciferol) 50000 Unit Caps (Ergocalciferol) .... One Tab By Mouth Weekly  Allergies: No Known Drug Allergies  Physical Exam  General:  Well-developed,well-nourished,in no acute distress; alert,appropriate and cooperative throughout examination Head:  Normocephalic and atraumatic without obvious abnormalities. No apparent alopecia or balding. Sinuses NT. Eyes:  Conjunctiva clear bilaterally.  Ears:  External ear exam shows no significant lesions or deformities.  Otoscopic examination reveals clear canals, tympanic membranes are intact bilaterally without bulging, retraction, inflammation or discharge. Hearing is grossly normal  bilaterally. Nose:  External nasal examination shows no deformity or inflammation. Nasal mucosa are pink and moist without lesions or exudates. Mouth:  Oral mucosa and oropharynx without lesions or exudates.  Teeth in good repair. Neck:  No deformities, masses, or tenderness noted. Chest Wall:  No deformities, masses, tenderness or gynecomastia noted. Lungs:  Normal respiratory effort, chest expands symmetrically. Lungs are clear to auscultation, no crackles or wheezes. Heart:  Normal rate and regular rhythm. S1 and S2 normal without gallop, murmur, click, rub or other extra sounds.   Impression & Recommendations:  Problem # 1:  VITAMIN D DEFICIENCY (ICD-268.9) Assessment Improved Now therapeutic. Change from Script to OTC 1000Iu two times a day. Recheck in Oct.  Complete Medication List: 1)  Multivitamins Tabs (Multiple vitamin) .Marland Kitchen.. 1 daily by mouth 2)  Aleve 220 Mg Tabs (Naproxen sodium) .... As instructed at urgent care 3)  Pravachol 80 Mg Tabs (Pravastatin sodium) .... One tab by mouth at night 4)  Metronidazole 0.75 % Gel (Metronidazole) .... Apply to affected area daily as needed 5)  Vitamin D 1000 Unit Tabs (Cholecalciferol) .... One tab by mouth two times a day  Patient Instructions: 1)  Start OTC Vit D 1000Iu two times a day. 2)  Make appt for Oct for recheck, lab and appt.   Tetanus/Td Immunization History:    Tetanus/Td # 1:  Tdap (10/25/2009) Received from [facility] per patient because of a trip

## 2010-11-02 ENCOUNTER — Encounter: Payer: Self-pay | Admitting: Family Medicine

## 2010-11-02 ENCOUNTER — Other Ambulatory Visit: Payer: Self-pay | Admitting: Family Medicine

## 2010-11-02 ENCOUNTER — Encounter (INDEPENDENT_AMBULATORY_CARE_PROVIDER_SITE_OTHER): Payer: Self-pay | Admitting: *Deleted

## 2010-11-02 ENCOUNTER — Other Ambulatory Visit (INDEPENDENT_AMBULATORY_CARE_PROVIDER_SITE_OTHER): Payer: 59

## 2010-11-02 DIAGNOSIS — E7800 Pure hypercholesterolemia, unspecified: Secondary | ICD-10-CM

## 2010-11-02 DIAGNOSIS — E559 Vitamin D deficiency, unspecified: Secondary | ICD-10-CM

## 2010-11-02 DIAGNOSIS — K5730 Diverticulosis of large intestine without perforation or abscess without bleeding: Secondary | ICD-10-CM

## 2010-11-02 DIAGNOSIS — Z125 Encounter for screening for malignant neoplasm of prostate: Secondary | ICD-10-CM

## 2010-11-02 DIAGNOSIS — R7309 Other abnormal glucose: Secondary | ICD-10-CM

## 2010-11-02 LAB — BASIC METABOLIC PANEL
BUN: 15 mg/dL (ref 6–23)
CO2: 28 mEq/L (ref 19–32)
Calcium: 8.9 mg/dL (ref 8.4–10.5)
Chloride: 102 mEq/L (ref 96–112)
Creatinine, Ser: 0.9 mg/dL (ref 0.4–1.5)

## 2010-11-02 LAB — CBC WITH DIFFERENTIAL/PLATELET
Basophils Absolute: 0 10*3/uL (ref 0.0–0.1)
Basophils Relative: 0.4 % (ref 0.0–3.0)
Eosinophils Absolute: 0.1 10*3/uL (ref 0.0–0.7)
Lymphocytes Relative: 27.9 % (ref 12.0–46.0)
MCHC: 35.1 g/dL (ref 30.0–36.0)
MCV: 96.3 fl (ref 78.0–100.0)
Monocytes Absolute: 0.6 10*3/uL (ref 0.1–1.0)
Neutrophils Relative %: 58.8 % (ref 43.0–77.0)
Platelets: 156 10*3/uL (ref 150.0–400.0)
RBC: 4.71 Mil/uL (ref 4.22–5.81)

## 2010-11-02 LAB — HEPATIC FUNCTION PANEL
Alkaline Phosphatase: 70 U/L (ref 39–117)
Bilirubin, Direct: 0.2 mg/dL (ref 0.0–0.3)
Total Bilirubin: 0.7 mg/dL (ref 0.3–1.2)

## 2010-11-02 LAB — MICROALBUMIN / CREATININE URINE RATIO
Creatinine,U: 277 mg/dL
Microalb Creat Ratio: 0.4 mg/g (ref 0.0–30.0)

## 2010-11-02 LAB — LIPID PANEL
Cholesterol: 141 mg/dL (ref 0–200)
LDL Cholesterol: 83 mg/dL (ref 0–99)
Triglycerides: 63 mg/dL (ref 0.0–149.0)

## 2010-11-08 ENCOUNTER — Encounter (INDEPENDENT_AMBULATORY_CARE_PROVIDER_SITE_OTHER): Payer: 59 | Admitting: Family Medicine

## 2010-11-08 ENCOUNTER — Encounter: Payer: Self-pay | Admitting: Family Medicine

## 2010-11-08 DIAGNOSIS — Z0000 Encounter for general adult medical examination without abnormal findings: Secondary | ICD-10-CM

## 2010-11-15 NOTE — Assessment & Plan Note (Signed)
Summary: CPX   Vital Signs:  Patient profile:   65 year old male Weight:      187.50 pounds BMI:     28.61 Temp:     97.6 degrees F oral Pulse rate:   60 / minute Pulse rhythm:   regular BP sitting:   114 / 74  (left arm) Cuff size:   large  Vitals Entered By: Sydell Axon LPN (2010/12/03 9:28 AM) CC: 30 Minute checkup, had a colonoscopy 09/10 by Dr. Arlyce Dice   History of Present Illness: Pt here for Comp Exam, feels well with no complaints.  He is on Vit D from last time. He is tolerating it well.  Preventive Screening-Counseling & Management  Alcohol-Tobacco     Alcohol drinks/day: 0     Smoking Status: never     Passive Smoke Exposure: no  Caffeine-Diet-Exercise     Caffeine use/day: 1     Does Patient Exercise: yes     Type of [facility]     Times/week: 4  Problems Prior to Update: 1)  Knee Pain, Right  (ICD-719.46) 2)  Vitamin D Deficiency  (ICD-268.9) 3)  Hyperglycemia  (ICD-790.29) 4)  Special Screening Malig Neoplasms Other Sites  (ICD-V76.49) 5)  Special Screening Malignant Neoplasm of Prostate  (ICD-V76.44) 6)  Health Maintenance Exam  (ICD-V70.0) 7)  Rosacea  (ICD-695.3) 8)  Hypercholesterolemia  (ICD-272.0) 9)  Diverticulosis, Colon  (ICD-562.10)  Medications Prior to Update: 1)  Multivitamins  Tabs (Multiple Vitamin) .Marland Kitchen.. 1 Daily By Mouth 2)  Aleve 220 Mg Tabs (Naproxen Sodium) .... As Instructed At Urgent Care 3)  Pravachol 80 Mg Tabs (Pravastatin Sodium) .... One Tab By Mouth At Night 4)  Metronidazole 0.75 % Gel (Metronidazole) .... Apply To Affected Area Daily As Needed 5)  Vitamin D 1000 Unit Tabs (Cholecalciferol) .... One Tab By Mouth Two Times A Day  Current Medications (verified): 1)  Aleve 220 Mg Tabs (Naproxen Sodium) .... As Instructed At Urgent Care 2)  Pravachol 80 Mg Tabs (Pravastatin Sodium) .... One Tab By Mouth At Night 3)  Metronidazole 0.75 % Gel (Metronidazole) .... Apply To Affected Area Daily As  Needed 4)  Vitamin D 1000 Unit Tabs (Cholecalciferol) .... One Tab By Mouth Three Times A Day 5)  Hyaluronic Acid 400-80-40 Mg Caps (Collagen-Chond-Hyaluronic Acid) .... Take One 3-4 Times A Week  Allergies: No Known Drug Allergies  Past History:  Past Medical History: Last updated: 09/05/2007 Diverticulosis, colon:(01/1999)  Past Surgical History: Last updated: 06/01/2009 L5 RUPTURED DISC REPAIR 1986 COLONOSCOPY: 2ND TO FAMILY HISTORY OF COLON CANCER:: DIVERTICS/ H/O POLYPS:(01/1999) CT L/S , H/P L5/S1:(04/1985) COLONOSCOPY , POLYP B-9, DIVERTICS (04/2004) REPEAT IN 5 YEARS [facility]. HERNIA REPAIR:(10/05/2005) STRESS MYOVIEW: NORMAL:(09/23/2006) COLONOSCOPY DIVERTICS ([facility]) (06/01/2009)  REPEAT IN 5 YRS  Family History: Last updated: 12/03/2010 Father: DECEASED 38 YOA , COLON CANCER Mother: dec 89 ALZHEIMERS BROTHER 74  Blood Clots/PE SISTER 66 SISTER 65 HTN,DM,P.E., MI CABG X 3 SISTER 57 CV: +CAD FATHER HBP: + FATHER, + SECOND SISTER DM: + FATHER, SECOND SISTER GOUT/ARTHRITIS: PROSTATE CANCER:\par BREAST/OVARIAN/UTERINE CANCER: COLON CANCER:+ FATHER DEPRESSION: NEGATIVE ETOH/DRUG ABUSE: NEGATIVE OTHER: NEGATIVE STROKE   Social History: Last updated: 09/22/2008 Marital Status: Married/ LIVES WITH WIFE Children: NO CHILDREN 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   Risk Factors: Alcohol Use: 0 (Dec 03, 2010) Caffeine Use: 1 (12/03/2010) Exercise: yes (03-Dec-2010)  Risk Factors: Smoking Status: never (12/03/2010) Passive Smoke Exposure: no (2010/12/03)  Family History: Father: DECEASED 57 YOA , COLON CANCER Mother:  dec 89 ALZHEIMERS BROTHER 69  Blood Clots/PE SISTER 66 SISTER 65 HTN,DM,P.E., MI CABG X 3 SISTER 57 CV: +CAD FATHER HBP: + FATHER, + SECOND SISTER DM: + FATHER, SECOND SISTER GOUT/ARTHRITIS: PROSTATE CANCER:\par BREAST/OVARIAN/UTERINE CANCER: COLON CANCER:+ FATHER DEPRESSION: NEGATIVE ETOH/DRUG ABUSE: NEGATIVE OTHER: NEGATIVE STROKE    Review of Systems General:  Denies chills, fatigue, fever, sweats, weakness, and weight loss. Eyes:  Denies blurring, discharge, and eye pain. ENT:  Denies decreased hearing, earache, and ringing in ears. CV:  Denies chest pain or discomfort, fainting, fatigue, palpitations, shortness of breath with exertion, swelling of feet, and swelling of hands. Resp:  Denies cough, shortness of breath, and wheezing. GI:  Denies abdominal pain, bloody stools, change in bowel habits, constipation, dark tarry stools, diarrhea, indigestion, loss of appetite, nausea, vomiting, vomiting blood, and yellowish skin color. GU:  Denies discharge, dysuria, nocturia, and urinary frequency. MS:  Complains of joint pain; denies muscle aches, cramps, and stiffness; mild knee, back. Derm:  Denies dryness, itching, and rash. Neuro:  Denies numbness, poor balance, tingling, and tremors.  Physical Exam  General:  Well-developed,well-nourished,in no acute distress; alert,appropriate and cooperative throughout examination Head:  Normocephalic and atraumatic without obvious abnormalities. No apparent alopecia or balding. Sinuses NT. Eyes:  Conjunctiva clear bilaterally.  Ears:  External ear exam shows no significant lesions or deformities.  Otoscopic examination reveals clear canals, tympanic membranes are intact bilaterally without bulging, retraction, inflammation or discharge. Hearing is grossly normal bilaterally. Nose:  External nasal examination shows no deformity or inflammation. Nasal mucosa are pink and moist without lesions or exudates. Mouth:  Oral mucosa and oropharynx without lesions or exudates.  Teeth in good repair. Neck:  No deformities, masses, or tenderness noted. Chest Wall:  No deformities, masses, tenderness or gynecomastia noted. Breasts:  No masses or gynecomastia noted Lungs:  Normal respiratory effort, chest expands symmetrically. Lungs are clear to auscultation, no crackles or wheezes. Heart:   Normal rate and regular rhythm. S1 and S2 normal without gallop, murmur, click, rub or other extra sounds. Abdomen:  Bowel sounds positive,abdomen soft and non-tender without masses, organomegaly or hernias noted. Rectal:  No external abnormalities noted. Normal sphincter tone. No rectal masses or tenderness. G neg. Genitalia:  Testes bilaterally descended without nodularity, tenderness or masses. No scrotal masses or lesions. No penis lesions or urethral discharge. Prostate:  Prostate gland firm and smooth, no enlargement, nodularity, tenderness, mass, asymmetry or induration. 10gms. Msk:  No deformity or scoliosis noted of thoracic or lumbar spine.   Pulses:  R and L carotid,radial,femoral,dorsalis pedis and posterior tibial pulses are full and equal bilaterally Extremities:  R knee with pain over the ant tibial plateau area, esp with stepping down. Neurologic:  No cranial nerve deficits noted. Station and gait are normal. Sensory, motor and coordinative functions appear intact. Skin:  Intact without suspicious lesions or rashes Cervical Nodes:  No lymphadenopathy noted Inguinal Nodes:  No significant adenopathy Psych:  Cognition and judgment appear intact. Alert and cooperative with normal attention span and concentration. No apparent delusions, illusions, hallucinations   Impression & Recommendations:  Problem # 1:  HEALTH MAINTENANCE EXAM (ICD-V70.0) Assessment Comment Only  Reviewed preventive care protocols, scheduled due services, and updated immunizations.  Problem # 2:  KNEE PAIN, RIGHT (ICD-719.46) Assessment: Improved Essentially resolveds on Hyaluronic acid. His updated medication list for this problem includes:    Aleve 220 Mg Tabs (Naproxen sodium) .Marland Kitchen... As instructed at urgent care  Problem # 3:  VITAMIN D DEFICIENCY (ICD-268.9) Assessment:  Unchanged Ncrease to 2000Iu two times a day.  Problem # 4:  HYPERGLYCEMIA (ICD-790.29) Assessment: Unchanged  Stable but  reiterated avoiding sweets and carbs.  Labs Reviewed: Creat: 0.9 (11/02/2010)     Problem # 5:  SPECIAL SCREENING MALIGNANT NEOPLASM OF PROSTATE (ICD-V76.44) Assessment: Unchanged Stable PSA and exam.  Problem # 6:  HYPERCHOLESTEROLEMIA (ICD-272.0) Assessment: Unchanged Adequate, cont Prava. His updated medication list for this problem includes:    Pravachol 80 Mg Tabs (Pravastatin sodium) ..... One tab by mouth at night  Labs Reviewed: SGOT: 18 (11/02/2010)   SGPT: 24 (11/02/2010)   HDL:45.30 (11/02/2010), 53.30 (09/14/2009)  LDL:83 (11/02/2010), 96 (09/14/2009)  Chol:141 (11/02/2010), 167 (09/14/2009)  Trig:63.0 (11/02/2010), 91.0 (09/14/2009)  Problem # 7:  DIVERTICULOSIS, COLON (ICD-562.10) Assessment: Unchanged  Colonoscopy UTD. Come in for prolonged LLQ discomfort.  Colonoscopy:  Labs Reviewed: Hgb: 15.9 (11/02/2010)   Hct: 45.4 (11/02/2010)   WBC: 5.1 (11/02/2010)  Complete Medication List: 1)  Aleve 220 Mg Tabs (Naproxen sodium) .... As instructed at urgent care 2)  Pravachol 80 Mg Tabs (Pravastatin sodium) .... One tab by mouth at night 3)  Metronidazole 0.75 % Gel (Metronidazole) .... Apply to affected area daily as needed 4)  Vitamin D 1000 Unit Tabs (Cholecalciferol) .... One tab by mouth three times a day 5)  Hyaluronic Acid 400-80-40 Mg Caps (Collagen-chond-hyaluronic acid) .... Take one 3-4 times a week  Patient Instructions: 1)  RTC one year, sooner as needed. Prescriptions: PRAVACHOL 80 MG TABS (PRAVASTATIN SODIUM) one tab by mouth at night  #90 Tablet x 3   Entered and Authorized by:   Shaune Leeks MD   Signed by:   Shaune Leeks MD on 11/08/2010   Method used:   Electronically to        [facility]* (retail)             ,          Ph: [phone]       Fax: [phone]   RxID:   [id number]    Orders Added: 1)  Est. Patient 40-64 years [25366]    Current Allergies (reviewed today): No known allergies

## 2010-12-16 ENCOUNTER — Encounter: Payer: Self-pay | Admitting: Family Medicine

## 2010-12-21 ENCOUNTER — Encounter: Payer: Self-pay | Admitting: Family Medicine

## 2010-12-21 ENCOUNTER — Ambulatory Visit (INDEPENDENT_AMBULATORY_CARE_PROVIDER_SITE_OTHER): Payer: 59 | Admitting: Family Medicine

## 2010-12-21 DIAGNOSIS — E7800 Pure hypercholesterolemia, unspecified: Secondary | ICD-10-CM

## 2010-12-21 DIAGNOSIS — L719 Rosacea, unspecified: Secondary | ICD-10-CM

## 2010-12-21 DIAGNOSIS — D485 Neoplasm of uncertain behavior of skin: Secondary | ICD-10-CM

## 2010-12-21 MED ORDER — PRAVASTATIN SODIUM 80 MG PO TABS: 80.0000 mg | ORAL_TABLET | Freq: Every day | ORAL | Status: DC

## 2010-12-21 MED ORDER — METRONIDAZOLE 0.75 % EX GEL: Freq: Every day | CUTANEOUS | Status: DC

## 2010-12-21 NOTE — Progress Notes (Signed)
  Subjective:    Patient ID: Shane Stewart, male    DOB: 1946/02/02, 65 y.o.   MRN: [id number]   HPI Pt here for small growth on right ear ext pinna that started as papule which he picked off and now has unhealed lesion. Picked it off two days ago. No h/o skin cancer previously.   Review of Systems Noncontributiory.     Objective:   Physical Exam R ear ext pinn with 2mm minimally erythematous macular area with mild circular sloughing and small indentation in the center.       Assessment & Plan:  Skin lesiojn, unknown etiol. Will observe for one month. If heals, nothing further. If not, to Derm for presumed Bx.

## 2010-12-21 NOTE — Patient Instructions (Addendum)
RTC one month for recheck

## 2011-01-25 ENCOUNTER — Encounter: Payer: Self-pay | Admitting: Family Medicine

## 2011-01-25 ENCOUNTER — Ambulatory Visit (INDEPENDENT_AMBULATORY_CARE_PROVIDER_SITE_OTHER): Payer: 59 | Admitting: Family Medicine

## 2011-01-25 VITALS — BP 124/78 | HR 80 | Temp 97.6°F | Ht 68.0 in | Wt 191.0 lb

## 2011-01-25 DIAGNOSIS — D485 Neoplasm of uncertain behavior of skin: Secondary | ICD-10-CM | POA: Insufficient documentation

## 2011-01-25 NOTE — Progress Notes (Signed)
  Subjective:    Patient ID: Shane Stewart, male    DOB: 16-Nov-1945, 65 y.o.   MRN: [id number]  HPI Pt here for followup. He had a lesion on the right ear that has not healed in one month and looks suspicious for skin cancer. He feels well otherwise and has never had skin cancer before nor ever seen a Engineer, petroleum.    Review of Systems Noncontrib     Objective:   Physical Exam WDWN WM NAD Right ear with 5-91mm maculopapular irreg lesion with dimpling that is skin colored.       Assessment & Plan:  Probable skin cancer. Refer to Plastic Surgery.

## 2011-01-25 NOTE — Patient Instructions (Signed)
Refer to Plastic Surgery.

## 2011-02-09 NOTE — Op Note (Signed)
Shane Stewart, Shane Stewart                 ACCOUNT NO.:  [id number]   MEDICAL RECORD NO.:  [id number]          PATIENT TYPE:  AMB   LOCATION:  [facility]                          FACILITY:  [facility]   PHYSICIAN:  Thornton Park. Daphine Deutscher, MD  DATE OF BIRTH:  1946-07-16   DATE OF PROCEDURE:  10/05/2005  DATE OF DISCHARGE:                                 OPERATIVE REPORT   PREOP DIAGNOSIS:  Umbilical hernia.   POSTOP DIAGNOSIS:  Umbilical hernia.   PROCEDURE:  Repair of umbilical hernia with mesh.   SURGEON:  Thornton Park. Daphine Deutscher, MD   ASSISTANT:  None.   ANESTHESIA:  General.   DESCRIPTION OF PROCEDURE:  [name] was taken to room 6 on October 05, 2005 and given general anesthesia. The abdomen was prepped with Betadine and  draped sterilely. A transverse incision was made beneath the umbilicus and  elevated the umbilical skin off of about a 2 cm in diameter umbilical hernia  containing properitoneal fat.   Once I dissected this free I got around the neck of the hernia and reduced  the fat en toto. This left a defect that was only about 1 cm in diameter. To  repeat that, I went ahead and cut a piece of mesh and anchored it at either  end with simple sutures of 2-0 Prolene; and then closed it in the middle  with 2-0 Prolene simple sutures. This completely obliterated the opening.  The area was injected with 1/2% Marcaine and the skin of the overlying  umbilicus was tacked down to the fascia; and the wound was closed with 4-0  Vicryl and with Dermabond.   The patient tolerated the procedure well. He will be given Vicodin to take  for pain.  He will be followed up in the office in 3-4 weeks      [name], MD  Electronically Signed     MBM/MEDQ  D:  10/05/2005  T:  10/06/2005  Job:  [id number]   cc:   Laurita Quint, M.D.  Fax: [phone]

## 2011-11-04 ENCOUNTER — Other Ambulatory Visit: Payer: Self-pay | Admitting: Family Medicine

## 2011-11-04 DIAGNOSIS — E7800 Pure hypercholesterolemia, unspecified: Secondary | ICD-10-CM

## 2011-11-08 ENCOUNTER — Other Ambulatory Visit (INDEPENDENT_AMBULATORY_CARE_PROVIDER_SITE_OTHER): Payer: 59

## 2011-11-08 DIAGNOSIS — E7800 Pure hypercholesterolemia, unspecified: Secondary | ICD-10-CM

## 2011-11-08 LAB — COMPREHENSIVE METABOLIC PANEL
ALT: 23 U/L (ref 0–53)
AST: 18 U/L (ref 0–37)
Albumin: 4.2 g/dL (ref 3.5–5.2)
Alkaline Phosphatase: 76 U/L (ref 39–117)
Potassium: 4.2 mEq/L (ref 3.5–5.1)
Sodium: 139 mEq/L (ref 135–145)
Total Protein: 6.8 g/dL (ref 6.0–8.3)

## 2011-11-08 LAB — LIPID PANEL
LDL Cholesterol: 83 mg/dL (ref 0–99)
Total CHOL/HDL Ratio: 3
VLDL: 12.6 mg/dL (ref 0.0–40.0)

## 2011-11-15 ENCOUNTER — Encounter: Payer: Self-pay | Admitting: Family Medicine

## 2011-11-15 ENCOUNTER — Ambulatory Visit (INDEPENDENT_AMBULATORY_CARE_PROVIDER_SITE_OTHER): Payer: 59 | Admitting: Family Medicine

## 2011-11-15 DIAGNOSIS — Z0000 Encounter for general adult medical examination without abnormal findings: Secondary | ICD-10-CM | POA: Insufficient documentation

## 2011-11-15 DIAGNOSIS — E7800 Pure hypercholesterolemia, unspecified: Secondary | ICD-10-CM

## 2011-11-15 DIAGNOSIS — R7309 Other abnormal glucose: Secondary | ICD-10-CM

## 2011-11-15 DIAGNOSIS — L719 Rosacea, unspecified: Secondary | ICD-10-CM

## 2011-11-15 DIAGNOSIS — D485 Neoplasm of uncertain behavior of skin: Secondary | ICD-10-CM

## 2011-11-15 NOTE — Assessment & Plan Note (Signed)
Resolved, neg path at biopsy per patient

## 2011-11-15 NOTE — Assessment & Plan Note (Signed)
Mild, continue diet and exercise.  D/w pt.

## 2011-11-15 NOTE — Assessment & Plan Note (Signed)
Controlled, continue diet, exercise, statin. Doing well.  

## 2011-11-15 NOTE — Patient Instructions (Addendum)
I would get the pneumonia vaccine when you turn 66. Check with your insurance to see if they will cover the shingles shot. I would get a flu shot each fall.   Take care.  Don't change your meds.  Call me if you need me.

## 2011-11-15 NOTE — Progress Notes (Signed)
CPE- See plan.  Routine anticipatory guidance given to patient.  See health maintenance.  Prostate cancer screening.  No FH and normal PSAs prev.  Stream is good w/o sx of dysuria. PSA options were discussed along with recent recs.  No indication for psa at this point, since patient is low risk and there is no FH of prosate CA.  He declined testing of PSA.  Per patient, prev neg path on skin biopsy for ear lesion.   Elevated Cholesterol: Using medications without problems:yes Muscle aches: no Diet compliance:no Exercise: yes, walking several miles a day, weights at the [facility] and SH reviewed  Meds, vitals, and allergies reviewed.   ROS: See HPI.  Otherwise negative.    GEN: nad, alert and oriented HEENT: mucous membranes moist NECK: supple w/o LA CV: rrr. PULM: ctab, no inc wob ABD: soft, +bs EXT: no edema SKIN: no acute rash

## 2011-11-15 NOTE — Assessment & Plan Note (Signed)
Controlled.  

## 2011-11-15 NOTE — Assessment & Plan Note (Signed)
Up to date on colon screening.   PSA options were discussed along with recent recs.  No indication for psa at this point, since patient is low risk and there is no FH of prosate CA.  He declined testing of PSA. D/w pt about flu/pna/zosta. Healthy habits encouraged.

## 2011-12-02 ENCOUNTER — Other Ambulatory Visit: Payer: Self-pay | Admitting: Family Medicine

## 2012-10-02 ENCOUNTER — Telehealth: Payer: Self-pay | Admitting: *Deleted

## 2012-10-02 NOTE — Telephone Encounter (Signed)
PT DROPPED OF FORM TO BE SIGNED BY Dr. Para March for Silver Sneakers.

## 2012-10-03 NOTE — Telephone Encounter (Signed)
I don't yet have this form.

## 2012-10-03 NOTE — Telephone Encounter (Signed)
I'll address the hard copy.  

## 2012-10-03 NOTE — Telephone Encounter (Signed)
Placed in your in box. 

## 2012-10-28 ENCOUNTER — Other Ambulatory Visit: Payer: Self-pay | Admitting: Family Medicine

## 2012-10-28 DIAGNOSIS — E559 Vitamin D deficiency, unspecified: Secondary | ICD-10-CM

## 2012-10-28 DIAGNOSIS — E7800 Pure hypercholesterolemia, unspecified: Secondary | ICD-10-CM

## 2012-11-04 ENCOUNTER — Other Ambulatory Visit (INDEPENDENT_AMBULATORY_CARE_PROVIDER_SITE_OTHER): Payer: Medicare Other

## 2012-11-04 DIAGNOSIS — E7800 Pure hypercholesterolemia, unspecified: Secondary | ICD-10-CM

## 2012-11-04 DIAGNOSIS — E559 Vitamin D deficiency, unspecified: Secondary | ICD-10-CM

## 2012-11-04 LAB — COMPREHENSIVE METABOLIC PANEL
ALT: 34 U/L (ref 0–53)
AST: 20 U/L (ref 0–37)
CO2: 31 mEq/L (ref 19–32)
Chloride: 101 mEq/L (ref 96–112)
GFR: 89.84 mL/min (ref >60.00)
Sodium: 138 mEq/L (ref 135–145)
Total Bilirubin: 0.8 mg/dL (ref 0.3–1.2)
Total Protein: 7.1 g/dL (ref 6.0–8.3)

## 2012-11-04 LAB — LIPID PANEL
HDL: 47.8 mg/dL (ref >39.00)
Total CHOL/HDL Ratio: 3

## 2012-11-06 ENCOUNTER — Other Ambulatory Visit: Payer: 59

## 2012-11-13 ENCOUNTER — Ambulatory Visit (INDEPENDENT_AMBULATORY_CARE_PROVIDER_SITE_OTHER): Payer: Medicare Other | Admitting: Family Medicine

## 2012-11-13 ENCOUNTER — Encounter: Payer: Self-pay | Admitting: Family Medicine

## 2012-11-13 VITALS — BP 112/58 | HR 81 | Temp 98.0°F | Ht 68.0 in | Wt 189.8 lb

## 2012-11-13 DIAGNOSIS — L719 Rosacea, unspecified: Secondary | ICD-10-CM

## 2012-11-13 DIAGNOSIS — E7800 Pure hypercholesterolemia, unspecified: Secondary | ICD-10-CM

## 2012-11-13 DIAGNOSIS — Z0000 Encounter for general adult medical examination without abnormal findings: Secondary | ICD-10-CM

## 2012-11-13 MED ORDER — PRAVASTATIN SODIUM 80 MG PO TABS: 80.0000 mg | ORAL_TABLET | Freq: Every day | ORAL | Status: DC

## 2012-11-13 MED ORDER — ZOSTER VACCINE LIVE 19400 UNT/0.65ML ~~LOC~~ SOLR: 0.6500 mL | Freq: Once | SUBCUTANEOUS | Status: DC

## 2012-11-13 MED ORDER — METRONIDAZOLE 0.75 % EX GEL: Freq: Every day | CUTANEOUS | Status: DC

## 2012-11-13 NOTE — Assessment & Plan Note (Signed)
Controlled, continue diet/exercise/statin.  Doing well.

## 2012-11-13 NOTE — Assessment & Plan Note (Signed)
See scanned forms.  Routine anticipatory guidance given to patient.  See health maintenance. Flu done 9/13 per patient Shingles d/w pt.   PNA done 10/21/12 Tetanus 2011 Colonoscopy 2010 Prostate cancer screening and PSA options (with potential risks and benefits of testing vs not testing) were discussed along with recent recs/guidelines.  He declined testing PSA at this point. Advance directive d/w pt.  He has one.  Wife Bonita Quin would be designated if incapacitated.   Cognitive function addressed- see scanned forms- and if abnormal then additional documentation follows.

## 2012-11-13 NOTE — Assessment & Plan Note (Signed)
Controlled, doing well. Continue current meds.

## 2012-11-13 NOTE — Progress Notes (Signed)
I have personally reviewed the Medicare Annual Wellness questionnaire and have noted 1. The patient's medical and social history 2. Their use of alcohol, tobacco or illicit drugs 3. Their current medications and supplements 4. The patient's functional ability including ADL's, fall risks, home safety risks and hearing or visual             impairment. 5. Diet and physical activities 6. Evidence for depression or mood disorders  The patients weight, height, BMI have been recorded in the chart and visual acuity is per eye clinic.  I have made referrals, counseling and provided education to the patient based review of the above and I have provided the pt with a written personalized care plan for preventive services.  See scanned forms.  Routine anticipatory guidance given to patient.  See health maintenance. Flu done 9/13 per patient Shingles d/w pt.   PNA done 10/21/12 Tetanus 2011 Colonoscopy 2010 Prostate cancer screening and PSA options (with potential risks and benefits of testing vs not testing) were discussed along with recent recs/guidelines.  He declined testing PSA at this point. Advance directive d/w pt.  He has one.  Wife Bonita Quin would be designated if incapacitated.   Cognitive function addressed- see scanned forms- and if abnormal then additional documentation follows.   Elevated Cholesterol: Using medications without problems: yes Muscle aches: no Diet compliance:yes Exercise:yes  Rosacea controlled with prn metogel.  Doing well with that.   PMH and SH reviewed  Meds, vitals, and allergies reviewed.   ROS: See HPI.  Otherwise negative.    GEN: nad, alert and oriented HEENT: mucous membranes moist NECK: supple w/o LA CV: rrr. PULM: ctab, no inc wob ABD: soft, +bs EXT: no edema SKIN: no acute rash

## 2012-11-13 NOTE — Patient Instructions (Addendum)
Don't change your meds.  Take care.  Glad to see you.  

## 2013-03-24 ENCOUNTER — Encounter: Payer: Self-pay | Admitting: Family Medicine

## 2013-07-06 ENCOUNTER — Telehealth: Payer: Self-pay | Admitting: Family Medicine

## 2013-07-06 NOTE — Telephone Encounter (Signed)
Pt scheduled for flu clinic

## 2013-07-06 NOTE — Telephone Encounter (Signed)
Pt wants to know about getting the high dose flu shot.  He has questions about whether he should get this type or not.  Please call pt.  [phone]

## 2013-07-09 ENCOUNTER — Ambulatory Visit (INDEPENDENT_AMBULATORY_CARE_PROVIDER_SITE_OTHER): Payer: Medicare Other

## 2013-07-09 DIAGNOSIS — Z23 Encounter for immunization: Secondary | ICD-10-CM

## 2013-08-13 ENCOUNTER — Encounter: Payer: Self-pay | Admitting: Family Medicine

## 2013-08-13 ENCOUNTER — Ambulatory Visit (INDEPENDENT_AMBULATORY_CARE_PROVIDER_SITE_OTHER): Payer: Medicare Other | Admitting: Family Medicine

## 2013-08-13 VITALS — BP 124/70 | HR 74 | Temp 97.4°F | Wt 185.5 lb

## 2013-08-13 DIAGNOSIS — J069 Acute upper respiratory infection, unspecified: Secondary | ICD-10-CM | POA: Insufficient documentation

## 2013-08-13 MED ORDER — FLUTICASONE PROPIONATE 50 MCG/ACT NA SUSP: 2.0000 | Freq: Every day | NASAL | Status: DC

## 2013-08-13 MED ORDER — AZITHROMYCIN 250 MG PO TABS: ORAL_TABLET | ORAL | Status: DC

## 2013-08-13 NOTE — Patient Instructions (Signed)
Drink plenty of fluids, take tylenol as needed, and gargle with warm salt water for your throat.  This should gradually improve.  Take care.  Let us know if you have other concerns.   Use the nasal steroids for now and then start the antibiotics in a few days if not better.

## 2013-08-13 NOTE — Progress Notes (Signed)
Pre-visit discussion using our clinic review tool. No additional management support is needed unless otherwise documented below in the visit note.  duration of symptoms: about 1 week ago Started with ST.  Persists. Sinus pressure.  Stuffy.  Minimal rhinorrhea.  Cough, some.  Dry usually, occ sputum.  Fever yesterday.  No ear pain. Some aches.  No vomiting, diarrhea, rash.  Voice is altered.  Hasn't been on nasal steroids.  Using OTC meds in the meantime.    ROS: See HPI.  Otherwise negative.    Meds, vitals, and allergies reviewed.   GEN: nad, alert and oriented HEENT: mucous membranes moist, TM w/o erythema, nasal epithelium injected, OP with cobblestoning, sinuses not ttp.   NECK: supple w/o LA CV: rrr. PULM: ctab, no inc wob ABD: soft, +bs EXT: no edema

## 2013-08-13 NOTE — Assessment & Plan Note (Signed)
Nontoxic, use flonase for now.  Supportive care o/w in meantime.  [facility] rx for zmax and f/u prn.  He agrees. D/w pt.

## 2013-10-02 ENCOUNTER — Telehealth: Payer: Self-pay

## 2013-10-02 MED ORDER — PRAVASTATIN SODIUM 80 MG PO TABS: 80.0000 mg | ORAL_TABLET | Freq: Every day | ORAL | Status: DC

## 2013-10-02 NOTE — Telephone Encounter (Signed)
Shane Stewart request pravastatin refill to [facility] (acct set up with mail order). Advised could send in # [phone] and Shane Stewart speaking with [name] to set up CPX.

## 2013-10-15 ENCOUNTER — Ambulatory Visit (INDEPENDENT_AMBULATORY_CARE_PROVIDER_SITE_OTHER): Payer: Medicare HMO | Admitting: Family Medicine

## 2013-10-15 ENCOUNTER — Encounter: Payer: Self-pay | Admitting: Family Medicine

## 2013-10-15 VITALS — BP 122/76 | HR 76 | Temp 97.4°F | Wt 187.5 lb

## 2013-10-15 DIAGNOSIS — J069 Acute upper respiratory infection, unspecified: Secondary | ICD-10-CM

## 2013-10-15 MED ORDER — AMOXICILLIN-POT CLAVULANATE 875-125 MG PO TABS: 1.0000 | ORAL_TABLET | Freq: Two times a day (BID) | ORAL | Status: DC

## 2013-10-15 NOTE — Patient Instructions (Signed)
Use the augmentin and nasal saline. This should gradually improve.  Take care.

## 2013-10-15 NOTE — Assessment & Plan Note (Signed)
Presumed sinusitis, would start augmentin, use nasal saline, hold flonase for now.  He agrees. F/u prn.

## 2013-10-15 NOTE — Progress Notes (Signed)
Pre-visit discussion using our clinic review tool. No additional management support is needed unless otherwise documented below in the visit note.  Sx started about 4 weeks ago.  Started with ST initially, better now.  Sx wax and wane in the interval.  Stuffy.  Cough.  No fevers.  Some sputum, some throat clearing.  No myalgias. No ear pain.  Using flonase intermittently.  Some occ bloody discharge. This isn't a typical seasonal issue for him, at this time of year.   He was concerned about his wife's memory and I asked for him to get her an appointment.   Meds, vitals, and allergies reviewed.   ROS: See HPI.  Otherwise, noncontributory.  GEN: nad, alert and oriented HEENT: mucous membranes moist, tm w/o erythema, nasal exam w/o erythema, discolored discharge noted,  OP with cobblestoning NECK: supple w/o LA CV: rrr.   PULM: ctab, no inc wob EXT: no edema SKIN: no acute rash

## 2013-11-09 ENCOUNTER — Encounter: Payer: Self-pay | Admitting: Family Medicine

## 2013-11-09 ENCOUNTER — Ambulatory Visit (INDEPENDENT_AMBULATORY_CARE_PROVIDER_SITE_OTHER): Payer: Medicare HMO | Admitting: Family Medicine

## 2013-11-09 VITALS — BP 128/76 | HR 82 | Temp 97.7°F | Wt 191.5 lb

## 2013-11-09 DIAGNOSIS — M25519 Pain in unspecified shoulder: Secondary | ICD-10-CM

## 2013-11-09 NOTE — Progress Notes (Signed)
Pre-visit discussion using our clinic review tool. No additional management support is needed unless otherwise documented below in the visit note.  L shoulder pain with abduction >90 deg and and with backswing in golf.  Started about a few months ago, worse recently.  No clear trigger. No audible pop or snap, not puffy or red.  Elbow and grip ROM wnl.  No R sided sx.  R handed. No neck pain. No pain with a deep breath.  He hasn't been exercising as much in the last 2 months but had pain with overhead presses prev.    Meds, vitals, and allergies reviewed.   ROS: See HPI.  Otherwise, noncontributory.  nad ncat Neck supple, normal ROM L shoulder with normal ROM and inspection, AC not ttp, no bruising No arm drop.  Pain with Int> ext rotation.  No pain on supraspinatus testing. scap assist with int rotation, resolution of pain Distally nv intact with normal ROM at the elbow.

## 2013-11-09 NOTE — Patient Instructions (Signed)
Use the shoulder exercises and if not better notify me.  Take ibuprofen for pain with food.   Take care.

## 2013-11-10 DIAGNOSIS — M25519 Pain in unspecified shoulder: Secondary | ICD-10-CM | POA: Insufficient documentation

## 2013-11-10 NOTE — Assessment & Plan Note (Signed)
Likely cuff strain, d/w pt.  Handout given re: anatomy and home exercises.  Call back if not improved.  He agrees.

## 2013-11-20 ENCOUNTER — Other Ambulatory Visit: Payer: Self-pay | Admitting: Family Medicine

## 2013-11-20 DIAGNOSIS — E559 Vitamin D deficiency, unspecified: Secondary | ICD-10-CM

## 2013-11-20 DIAGNOSIS — E7800 Pure hypercholesterolemia, unspecified: Secondary | ICD-10-CM

## 2013-11-25 ENCOUNTER — Other Ambulatory Visit (INDEPENDENT_AMBULATORY_CARE_PROVIDER_SITE_OTHER): Payer: Medicare HMO

## 2013-11-25 DIAGNOSIS — R7309 Other abnormal glucose: Secondary | ICD-10-CM

## 2013-11-25 DIAGNOSIS — E559 Vitamin D deficiency, unspecified: Secondary | ICD-10-CM

## 2013-11-25 DIAGNOSIS — E7800 Pure hypercholesterolemia, unspecified: Secondary | ICD-10-CM

## 2013-11-25 LAB — LIPID PANEL
CHOLESTEROL: 151 mg/dL (ref 0–200)
HDL: 52.4 mg/dL (ref >39.00)
LDL Cholesterol: 76 mg/dL (ref 0–99)
Total CHOL/HDL Ratio: 3
Triglycerides: 113 mg/dL (ref 0.0–149.0)
VLDL: 22.6 mg/dL (ref 0.0–40.0)

## 2013-11-25 LAB — COMPREHENSIVE METABOLIC PANEL
ALT: 24 U/L (ref 0–53)
AST: 20 U/L (ref 0–37)
Albumin: 4 g/dL (ref 3.5–5.2)
Alkaline Phosphatase: 80 U/L (ref 39–117)
BILIRUBIN TOTAL: 1.1 mg/dL (ref 0.3–1.2)
BUN: 12 mg/dL (ref 6–23)
CALCIUM: 9.3 mg/dL (ref 8.4–10.5)
CO2: 30 meq/L (ref 19–32)
Chloride: 103 mEq/L (ref 96–112)
Creatinine, Ser: 0.9 mg/dL (ref 0.4–1.5)
GFR: 86.22 mL/min (ref >60.00)
GLUCOSE: 99 mg/dL (ref 70–99)
Potassium: 4.4 mEq/L (ref 3.5–5.1)
SODIUM: 139 meq/L (ref 135–145)
TOTAL PROTEIN: 6.7 g/dL (ref 6.0–8.3)

## 2013-11-26 ENCOUNTER — Other Ambulatory Visit: Payer: Medicare Other

## 2013-11-26 LAB — VITAMIN D 25 HYDROXY (VIT D DEFICIENCY, FRACTURES): Vit D, 25-Hydroxy: 53 ng/mL (ref 30–89)

## 2013-12-03 ENCOUNTER — Encounter: Payer: Self-pay | Admitting: Family Medicine

## 2013-12-03 ENCOUNTER — Ambulatory Visit (INDEPENDENT_AMBULATORY_CARE_PROVIDER_SITE_OTHER): Payer: Medicare HMO | Admitting: Family Medicine

## 2013-12-03 VITALS — BP 122/66 | HR 79 | Temp 97.8°F | Ht 68.0 in | Wt 189.5 lb

## 2013-12-03 DIAGNOSIS — Z0000 Encounter for general adult medical examination without abnormal findings: Secondary | ICD-10-CM

## 2013-12-03 DIAGNOSIS — M25519 Pain in unspecified shoulder: Secondary | ICD-10-CM

## 2013-12-03 DIAGNOSIS — E7800 Pure hypercholesterolemia, unspecified: Secondary | ICD-10-CM

## 2013-12-03 MED ORDER — PRAVASTATIN SODIUM 80 MG PO TABS: 80.0000 mg | ORAL_TABLET | Freq: Every day | ORAL | Status: DC

## 2013-12-03 NOTE — Progress Notes (Signed)
Pre visit review using our clinic review tool, if applicable. No additional management support is needed unless otherwise documented below in the visit note.  I have personally reviewed the Medicare Annual Wellness questionnaire and have noted 1. The patient's medical and social history 2. Their use of alcohol, tobacco or illicit drugs 3. Their current medications and supplements 4. The patient's functional ability including ADL's, fall risks, home safety risks and hearing or visual             impairment. 5. Diet and physical activities 6. Evidence for depression or mood disorders  The patients weight, height, BMI have been recorded in the chart and visual acuity is per eye clinic.  I have made referrals, counseling and provided education to the patient based review of the above and I have provided the pt with a written personalized care plan for preventive services.  See scanned forms. Routine anticipatory guidance given to patient. See health maintenance.  Flu done 9/13 per patient  Shingles 2014  PNA done 10/21/12  Tetanus 2011  Colonoscopy 2010  Prostate cancer screening and PSA options (with potential risks and benefits of testing vs not testing) were discussed along with recent recs/guidelines. He declined testing PSA at this point.  No LUTS.   Advance directive d/w pt. He has one. Wife Vaughan Basta would be designated if incapacitated.  Cognitive function addressed- see scanned forms- and if abnormal then additional documentation follows.   Elevated Cholesterol: Using medications without problems:yes Muscle aches: no, none other than his shoulder.  Diet compliance:yes Exercise: yes  L shoulder pain.  Maybe some better, has been diligent with home exercises. Not taking nsaids yet.  No new injuries.    PMH and SH reviewed  Meds, vitals, and allergies reviewed.   ROS: See HPI.  Otherwise negative.    GEN: nad, alert and oriented HEENT: mucous membranes moist NECK: supple w/o  LA CV: rrr. PULM: ctab, no inc wob ABD: soft, +bs EXT: no edema SKIN: no acute rash  L shoulder with normal ROM and inspection, AC not ttp,  No arm drop.  Pain with Int> ext rotation but ROM improved from prev No pain on supraspinatus testing.  scap assist with int rotation, resolution of pain  Distally nv intact with normal ROM at the elbow.

## 2013-12-03 NOTE — Assessment & Plan Note (Signed)
Some better, not resolve.  Not using nsaids.  Would start ibuprofen with food tid prn.  He agrees.  If not better, we can set him up with ortho.  Still appears to be related to subscapularis.

## 2013-12-03 NOTE — Assessment & Plan Note (Signed)
Controlled, continue as is.  

## 2013-12-03 NOTE — Patient Instructions (Addendum)
If you don't get a letter or call from GI in the fall, then call them about your colonoscopy.  OTC ibuprofen, 3 pills up to 3 times a day with food.  Update me next week.  We can get you to ortho if needed.   Take care.

## 2013-12-03 NOTE — Assessment & Plan Note (Signed)
See scanned forms. Routine anticipatory guidance given to patient. See health maintenance.  Flu done 9/13 per patient  Shingles 2014  PNA done 10/21/12  Tetanus 2011  Colonoscopy 2010  Prostate cancer screening and PSA options (with potential risks and benefits of testing vs not testing) were discussed along with recent recs/guidelines. He declined testing PSA at this point.  No LUTS.   Advance directive d/w pt. He has one. Wife Vaughan Basta would be designated if incapacitated.  Cognitive function addressed- see scanned forms- and if abnormal then additional documentation follows.

## 2013-12-16 ENCOUNTER — Encounter: Payer: Self-pay | Admitting: Family Medicine

## 2013-12-17 ENCOUNTER — Other Ambulatory Visit: Payer: Self-pay | Admitting: Family Medicine

## 2013-12-17 DIAGNOSIS — M25519 Pain in unspecified shoulder: Secondary | ICD-10-CM

## 2013-12-30 ENCOUNTER — Telehealth: Payer: Self-pay | Admitting: Family Medicine

## 2013-12-30 NOTE — Telephone Encounter (Signed)
Pt came by office and wanted to speka with you about Ortho referral that Dr. Damita Dunnings wanted him to have. Pt asks if you could give him a call.

## 2014-01-01 NOTE — Telephone Encounter (Signed)
Appt made for patient for 01/05/14 patient aware.

## 2014-04-06 ENCOUNTER — Encounter: Payer: Self-pay | Admitting: Gastroenterology

## 2014-06-01 ENCOUNTER — Encounter: Payer: Self-pay | Admitting: Gastroenterology

## 2014-07-15 ENCOUNTER — Ambulatory Visit (AMBULATORY_SURGERY_CENTER): Payer: Commercial Managed Care - HMO | Admitting: *Deleted

## 2014-07-15 DIAGNOSIS — Z800 Family history of malignant neoplasm of digestive organs: Secondary | ICD-10-CM

## 2014-07-15 MED ORDER — NA SULFATE-K SULFATE-MG SULF 17.5-3.13-1.6 GM/177ML PO SOLN: ORAL | Status: DC

## 2014-07-15 NOTE — Progress Notes (Signed)
No allergies to eggs or soy. No problems with anesthesia.  Pt given Emmi instructions for colonoscopy  No oxygen use  No diet drug use  

## 2014-07-29 ENCOUNTER — Ambulatory Visit (AMBULATORY_SURGERY_CENTER): Payer: Commercial Managed Care - HMO | Admitting: Gastroenterology

## 2014-07-29 ENCOUNTER — Encounter: Payer: Self-pay | Admitting: Gastroenterology

## 2014-07-29 VITALS — BP 115/62 | HR 76 | Temp 98.0°F | Resp 15 | Ht 68.0 in | Wt 189.0 lb

## 2014-07-29 DIAGNOSIS — Z1211 Encounter for screening for malignant neoplasm of colon: Secondary | ICD-10-CM

## 2014-07-29 DIAGNOSIS — K5730 Diverticulosis of large intestine without perforation or abscess without bleeding: Secondary | ICD-10-CM | POA: Diagnosis not present

## 2014-07-29 DIAGNOSIS — D123 Benign neoplasm of transverse colon: Secondary | ICD-10-CM

## 2014-07-29 DIAGNOSIS — Z800 Family history of malignant neoplasm of digestive organs: Secondary | ICD-10-CM

## 2014-07-29 MED ORDER — SODIUM CHLORIDE 0.9 % IV SOLN: 500.0000 mL | INTRAVENOUS | Status: DC

## 2014-07-29 NOTE — Op Note (Signed)
[facility]  [address] [place], [address]   COLONOSCOPY PROCEDURE REPORT  PATIENT: Shane Stewart, Shane Stewart  MR#: [id number] BIRTHDATE: November 28, 1945 , 36  yrs. old GENDER: male ENDOSCOPIST: Inda Castle, MD REFERRED XB:[id number] Duncan, M.D. PROCEDURE DATE:  07/29/2014 PROCEDURE:   Colonoscopy with snare polypectomy First Screening Colonoscopy - Avg.  risk and is 50 yrs.  old or older - No.  Prior Negative Screening - Now for repeat screening. Above average risk  History of Adenoma - Now for follow-up colonoscopy & has been > or = to 3 yrs.  N/A  Polyps Removed Today? Yes. ASA CLASS:   Class II INDICATIONS:patient's immediate family history of colon cancer. MEDICATIONS: Monitored anesthesia care and Propofol 200 mg IV  DESCRIPTION OF PROCEDURE:   After the risks benefits and alternatives of the procedure were thoroughly explained, informed consent was obtained.  The digital rectal exam revealed no abnormalities of the rectum.   The LB [id number]  endoscope was introduced through the anus and advanced to the cecum, which was identified by both the appendix and ileocecal valve. No adverse events experienced.   The quality of the prep was excellent using Suprep  The instrument was then slowly withdrawn as the colon was fully examined.      COLON FINDINGS: There was moderate diverticulosis noted in the descending colon and sigmoid colon.   A sessile polyp measuring 3 mm in size was found at the hepatic flexure.  A polypectomy was performed with a cold snare.  The resection was complete, the polyp tissue was completely retrieved and sent to histology.   The examination was otherwise normal.     The time to cecum=2 minutes 56 seconds.  Withdrawal time=6 minutes 37 seconds.  The scope was withdrawn and the procedure completed. COMPLICATIONS: There were no immediate complications.  ENDOSCOPIC IMPRESSION: 1.   Moderate diverticulosis was noted in the descending  colon and sigmoid colon 2.   Sessile polyp was found at the hepatic flexure; polypectomy was performed with a cold snare 3.   The examination was otherwise normal  RECOMMENDATIONS: Given your significant family history of colon cancer, you should have a repeat colonoscopy in 5 years  eSigned:  Inda Castle, MD 07/29/2014 8:29 AM   cc:

## 2014-07-29 NOTE — Patient Instructions (Signed)

## 2014-07-29 NOTE — Progress Notes (Signed)
Called to room to assist during endoscopic procedure.  Patient ID and intended procedure confirmed with present staff. Received instructions for my participation in the procedure from the performing physician.  

## 2014-07-29 NOTE — Progress Notes (Signed)
Patient awakening,vss,report to rn 

## 2014-07-30 ENCOUNTER — Telehealth: Payer: Self-pay

## 2014-07-30 NOTE — Telephone Encounter (Signed)
  Follow up Call-  Call back number 07/29/2014  Post procedure Call Back phone  # [phone]  Permission to leave phone message Yes     Patient questions:  Do you have a fever, pain , or abdominal swelling? No. Pain Score  0 *  Have you tolerated food without any problems? Yes.    Have you been able to return to your normal activities? Yes.    Do you have any questions about your discharge instructions: Diet   No. Medications  No. Follow up visit  No.  Do you have questions or concerns about your Care? No.  Actions: * If pain score is 4 or above: No action needed, pain <4.

## 2014-08-04 ENCOUNTER — Encounter: Payer: Self-pay | Admitting: Family Medicine

## 2014-08-05 ENCOUNTER — Encounter: Payer: Self-pay | Admitting: Gastroenterology

## 2014-08-06 ENCOUNTER — Ambulatory Visit (INDEPENDENT_AMBULATORY_CARE_PROVIDER_SITE_OTHER): Payer: Commercial Managed Care - HMO | Admitting: Family Medicine

## 2014-08-06 ENCOUNTER — Encounter: Payer: Self-pay | Admitting: Family Medicine

## 2014-08-06 VITALS — BP 122/74 | HR 77 | Temp 97.9°F | Wt 189.2 lb

## 2014-08-06 DIAGNOSIS — L989 Disorder of the skin and subcutaneous tissue, unspecified: Secondary | ICD-10-CM

## 2014-08-06 NOTE — Progress Notes (Signed)
Pre visit review using our clinic review tool, if applicable. No additional management support is needed unless otherwise documented below in the visit note.  Noted some white lesions on the face.  Near the temples and on the anterior L neck.  No itching, not sore.  No draining.  No topical meds used.  No new soaps, detergents, etc.   The skin lesions feel like normal skin, not rough.   H/o rosacea but not skin cancers.    Meds, vitals, and allergies reviewed.   ROS: See HPI.  Otherwise, noncontributory.  nad ncat Scalp and face wnl except for 3 faintly hypopigmented macules, 1 near each temple and one on the L anterior neck. No ulceration or irritation o/w.

## 2014-08-06 NOTE — Patient Instructions (Signed)
Use sunblock or a hat when outside.  If enlarging, then you can use a little OTC hydrocortisone.  If still enlarging after that, we can set you up with dermatology.

## 2014-08-08 DIAGNOSIS — R21 Rash and other nonspecific skin eruption: Secondary | ICD-10-CM | POA: Insufficient documentation

## 2014-08-08 NOTE — Assessment & Plan Note (Signed)
Looks to be benign hypopigmentation w/o other changes on other regions of the body- ie no arm or hand changes.  D/w pt.   Use sunblock or a hat when outside. If enlarging, then can use OTC hydrocortisone. If still enlarging after that, we can set him up with dermatology.

## 2014-09-23 ENCOUNTER — Other Ambulatory Visit: Payer: Self-pay | Admitting: Family Medicine

## 2014-11-24 ENCOUNTER — Other Ambulatory Visit: Payer: Self-pay | Admitting: Family Medicine

## 2014-11-28 ENCOUNTER — Other Ambulatory Visit: Payer: Self-pay | Admitting: Family Medicine

## 2014-11-28 DIAGNOSIS — E7800 Pure hypercholesterolemia, unspecified: Secondary | ICD-10-CM

## 2014-11-30 ENCOUNTER — Other Ambulatory Visit (INDEPENDENT_AMBULATORY_CARE_PROVIDER_SITE_OTHER): Payer: Commercial Managed Care - HMO

## 2014-11-30 ENCOUNTER — Other Ambulatory Visit: Payer: Self-pay | Admitting: Family Medicine

## 2014-11-30 DIAGNOSIS — E7800 Pure hypercholesterolemia, unspecified: Secondary | ICD-10-CM

## 2014-11-30 LAB — COMPREHENSIVE METABOLIC PANEL
ALT: 22 U/L (ref 0–53)
AST: 16 U/L (ref 0–37)
Albumin: 4.2 g/dL (ref 3.5–5.2)
Alkaline Phosphatase: 78 U/L (ref 39–117)
BUN: 12 mg/dL (ref 6–23)
CO2: 32 mEq/L (ref 19–32)
Calcium: 9.5 mg/dL (ref 8.4–10.5)
Chloride: 104 mEq/L (ref 96–112)
Creatinine, Ser: 0.97 mg/dL (ref 0.40–1.50)
GFR: 81.88 mL/min (ref >60.00)
Glucose, Bld: 113 mg/dL — ABNORMAL HIGH (ref 70–99)
POTASSIUM: 4.3 meq/L (ref 3.5–5.1)
SODIUM: 138 meq/L (ref 135–145)
Total Bilirubin: 0.7 mg/dL (ref 0.2–1.2)
Total Protein: 6.7 g/dL (ref 6.0–8.3)

## 2014-11-30 LAB — LIPID PANEL
CHOLESTEROL: 137 mg/dL (ref 0–200)
HDL: 49.4 mg/dL (ref >39.00)
LDL Cholesterol: 71 mg/dL (ref 0–99)
NonHDL: 87.6
TRIGLYCERIDES: 83 mg/dL (ref 0.0–149.0)
Total CHOL/HDL Ratio: 3
VLDL: 16.6 mg/dL (ref 0.0–40.0)

## 2014-12-07 ENCOUNTER — Ambulatory Visit (INDEPENDENT_AMBULATORY_CARE_PROVIDER_SITE_OTHER): Payer: Commercial Managed Care - HMO | Admitting: Family Medicine

## 2014-12-07 ENCOUNTER — Encounter: Payer: Self-pay | Admitting: Family Medicine

## 2014-12-07 VITALS — BP 122/70 | HR 84 | Temp 97.7°F | Ht 68.0 in | Wt 187.2 lb

## 2014-12-07 DIAGNOSIS — M25512 Pain in left shoulder: Secondary | ICD-10-CM

## 2014-12-07 DIAGNOSIS — E7800 Pure hypercholesterolemia, unspecified: Secondary | ICD-10-CM

## 2014-12-07 DIAGNOSIS — L989 Disorder of the skin and subcutaneous tissue, unspecified: Secondary | ICD-10-CM

## 2014-12-07 DIAGNOSIS — L719 Rosacea, unspecified: Secondary | ICD-10-CM

## 2014-12-07 DIAGNOSIS — M25519 Pain in unspecified shoulder: Secondary | ICD-10-CM

## 2014-12-07 DIAGNOSIS — Z7189 Other specified counseling: Secondary | ICD-10-CM

## 2014-12-07 DIAGNOSIS — Z0000 Encounter for general adult medical examination without abnormal findings: Secondary | ICD-10-CM

## 2014-12-07 DIAGNOSIS — Z23 Encounter for immunization: Secondary | ICD-10-CM

## 2014-12-07 MED ORDER — METRONIDAZOLE 0.75 % EX GEL: Freq: Every day | CUTANEOUS | Status: DC

## 2014-12-07 MED ORDER — PRAVASTATIN SODIUM 80 MG PO TABS: 80.0000 mg | ORAL_TABLET | Freq: Every day | ORAL | Status: DC

## 2014-12-07 NOTE — Patient Instructions (Addendum)
Rosaria Ferries will call about your referral. Take care.  Keep exercising and avoid sweets.  Glad to see you.

## 2014-12-07 NOTE — Progress Notes (Signed)
Pre visit review using our clinic review tool, if applicable. No additional management support is needed unless otherwise documented below in the visit note.  I have personally reviewed the Medicare Annual Wellness questionnaire and have noted 1. The patient's medical and social history 2. Their use of alcohol, tobacco or illicit drugs 3. Their current medications and supplements 4. The patient's functional ability including ADL's, fall risks, home safety risks and hearing or visual             impairment. 5. Diet and physical activities 6. Evidence for depression or mood disorders  The patients weight, height, BMI have been recorded in the chart and visual acuity is per eye clinic.  I have made referrals, counseling and provided education to the patient based review of the above and I have provided the pt with a written personalized care plan for preventive services.  Provider list updated- see scanned forms.  Routine anticipatory guidance given to patient.  See health maintenance.  Flu 2015 Shingles 2014 PNA 2014 Tetanus 2011 Colonoscopy 2015 Prostate cancer screening and PSA options (with potential risks and benefits of testing vs not testing) were discussed along with recent recs/guidelines.  He declined testing PSA at this point. Advance directive- wife Claude Manges if patient were incapacitated.   Cognitive function addressed- see scanned forms- and if abnormal then additional documentation follows.   L shoulder prev injected but still with some discomfort.  Needs referral back to ortho.  He'll f/u with them.   Elevated Cholesterol: Using medications without problems: yes Muscle aches: no Diet compliance: yes Exercise:yes, except for limited L shoulder ROM/pain, walking daily  PMH and SH reviewed  Meds, vitals, and allergies reviewed.   ROS: See HPI.  Otherwise negative.    GEN: nad, alert and oriented HEENT: mucous membranes moist NECK: supple w/o LA CV:  rrr. PULM: ctab, no inc wob ABD: soft, +bs EXT: no edema SKIN: no acute rash

## 2014-12-08 DIAGNOSIS — Z7189 Other specified counseling: Secondary | ICD-10-CM | POA: Insufficient documentation

## 2014-12-08 NOTE — Assessment & Plan Note (Signed)
Controlled, mild inc in sugar d/w pt.  D/w pt about diet and exercise, would continue statin for now.  Likely benefit > risk.  He agrees.

## 2014-12-08 NOTE — Assessment & Plan Note (Signed)
Flu 2015 Shingles 2014 PNA 2014 Tetanus 2011 Colonoscopy 2015 Prostate cancer screening and PSA options (with potential risks and benefits of testing vs not testing) were discussed along with recent recs/guidelines.  He declined testing PSA at this point. Advance directive- wife Claude Manges if patient were incapacitated.   Cognitive function addressed- see scanned forms- and if abnormal then additional documentation follows.

## 2014-12-08 NOTE — Assessment & Plan Note (Signed)
Per ortho. Refer back.

## 2014-12-08 NOTE — Assessment & Plan Note (Signed)
The three hypopigmented areas (L side of neck, the B temples) appear unchanged.  He has minimal AK changes on the forehead, not acute worsened.  Would observe all for now. No compelling reason for intervention.

## 2014-12-23 ENCOUNTER — Encounter: Payer: Self-pay | Admitting: Family Medicine

## 2014-12-31 ENCOUNTER — Encounter: Payer: Self-pay | Admitting: Family Medicine

## 2014-12-31 ENCOUNTER — Ambulatory Visit (INDEPENDENT_AMBULATORY_CARE_PROVIDER_SITE_OTHER): Payer: Commercial Managed Care - HMO | Admitting: Family Medicine

## 2014-12-31 VITALS — BP 104/68 | HR 78 | Temp 98.2°F | Resp 16 | Wt 184.8 lb

## 2014-12-31 DIAGNOSIS — L719 Rosacea, unspecified: Secondary | ICD-10-CM | POA: Diagnosis not present

## 2014-12-31 DIAGNOSIS — L255 Unspecified contact dermatitis due to plants, except food: Secondary | ICD-10-CM

## 2014-12-31 MED ORDER — PREDNISONE 20 MG PO TABS: ORAL_TABLET | ORAL | Status: DC

## 2014-12-31 MED ORDER — METRONIDAZOLE 0.75 % EX GEL: Freq: Every day | CUTANEOUS | Status: DC

## 2014-12-31 MED ORDER — TRIAMCINOLONE ACETONIDE 0.5 % EX CREA: 1.0000 "application " | TOPICAL_CREAM | Freq: Two times a day (BID) | CUTANEOUS | Status: DC | PRN

## 2014-12-31 NOTE — Progress Notes (Signed)
Needed refill on metrogel, done.  Uses w/o ADE.  Good effect for rosacea.   Poison ivy/oak.  Got into a few days ago.  Itchy.  R arm and R side of neck.  No fever, no chills.  Not painful, just itchy.    Meds, vitals, and allergies reviewed.   ROS: See HPI.  Otherwise, noncontributory.  nad Typical rhus rash on R arm, patches of erythema with small amounts of blistering.  No fluctuant mass.

## 2014-12-31 NOTE — Patient Instructions (Signed)
Use the cream.  If not better, then start the pills.  Take the pills with food.   Take care.  Glad to see you.

## 2015-01-03 DIAGNOSIS — L255 Unspecified contact dermatitis due to plants, except food: Secondary | ICD-10-CM | POA: Insufficient documentation

## 2015-01-03 NOTE — Assessment & Plan Note (Signed)
Use topical steroid, routine cautions.  If sig expansion, then start pred taper with routine steroid cautions.  He agrees. Doesn't look infected.  Should do well.

## 2015-03-11 ENCOUNTER — Encounter: Payer: Self-pay | Admitting: Gastroenterology

## 2015-09-01 ENCOUNTER — Encounter: Payer: Self-pay | Admitting: Family Medicine

## 2015-09-02 ENCOUNTER — Other Ambulatory Visit: Payer: Self-pay | Admitting: Family Medicine

## 2015-09-02 DIAGNOSIS — Z0100 Encounter for examination of eyes and vision without abnormal findings: Secondary | ICD-10-CM

## 2015-09-07 ENCOUNTER — Encounter: Payer: Self-pay | Admitting: Family Medicine

## 2015-11-07 ENCOUNTER — Encounter: Payer: Self-pay | Admitting: Family Medicine

## 2015-11-24 ENCOUNTER — Other Ambulatory Visit: Payer: Self-pay | Admitting: Family Medicine

## 2015-11-24 DIAGNOSIS — E7800 Pure hypercholesterolemia, unspecified: Secondary | ICD-10-CM

## 2015-11-25 ENCOUNTER — Ambulatory Visit (INDEPENDENT_AMBULATORY_CARE_PROVIDER_SITE_OTHER): Payer: PPO | Admitting: Family Medicine

## 2015-11-25 ENCOUNTER — Encounter: Payer: Self-pay | Admitting: Family Medicine

## 2015-11-25 VITALS — BP 130/72 | HR 109 | Temp 98.9°F | Wt 181.0 lb

## 2015-11-25 DIAGNOSIS — R059 Cough, unspecified: Secondary | ICD-10-CM

## 2015-11-25 DIAGNOSIS — B349 Viral infection, unspecified: Secondary | ICD-10-CM

## 2015-11-25 DIAGNOSIS — R05 Cough: Secondary | ICD-10-CM

## 2015-11-25 LAB — POC INFLUENZA A&B (BINAX/QUICKVUE)
Influenza A, POC: NEGATIVE
Influenza B, POC: NEGATIVE

## 2015-11-25 MED ORDER — FLUTICASONE PROPIONATE 50 MCG/ACT NA SUSP: 2.0000 | Freq: Every day | NASAL | Status: DC

## 2015-11-25 MED ORDER — BENZONATATE 200 MG PO CAPS: 200.0000 mg | ORAL_CAPSULE | Freq: Two times a day (BID) | ORAL | Status: DC | PRN

## 2015-11-25 NOTE — Assessment & Plan Note (Signed)
Flu neg, d/w pt.  Nontoxic.  Supportive care.  Tessalon prn for cough, start flonase for congestion.  He agrees.  F/u prn. Okay for outpatient f/u.  I offered condolences re: his wife's unexpected death.  He is working through his loss.

## 2015-11-25 NOTE — Patient Instructions (Addendum)
Flu negative.   Use flonase 2 sprays per nostril daily.  Tessalon for cough.  Drink plenty of fluids, take ibuprofen as needed with food, and gargle with warm salt water for your throat.  This should gradually improve.   Take care.  Let us know if you have other concerns.

## 2015-11-25 NOTE — Progress Notes (Signed)
Pre visit review using our clinic review tool, if applicable. No additional management support is needed unless otherwise documented below in the visit note.  Offered condolences re: death of his wife.    Sx started 2 days ago.  Lightheaded, stuffy, then some aches.  Some cough, scant sputum.   Has had fever >100 last night.  No vomiting, no diarrhea.  No rash.  No ear pain.  Mild facial pain.  Mild ST.  No wheeze.  Sneezing more the last few days.  Likely mult sick exposures.  Tried cough drops and took ibuprofen.   Meds, vitals, and allergies reviewed.   ROS: See HPI.  Otherwise, noncontributory.  GEN: nad, alert and oriented HEENT: mucous membranes moist, tm w/o erythema, nasal exam w/o erythema, clear discharge noted,  OP with cobblestoning, sinuses not ttp NECK: supple w/o LA CV: rrr.   PULM: ctab, no inc wob EXT: no edema SKIN: no acute rash

## 2015-11-27 ENCOUNTER — Telehealth: Payer: Self-pay | Admitting: Family Medicine

## 2015-11-27 ENCOUNTER — Encounter: Payer: Self-pay | Admitting: Family Medicine

## 2015-11-27 NOTE — Telephone Encounter (Signed)
Call pt.  Please get him on the schedule Monday.  Cough and fever.  Thanks.  Routed to LF and CE.

## 2015-11-28 ENCOUNTER — Encounter: Payer: Self-pay | Admitting: Family Medicine

## 2015-11-28 ENCOUNTER — Telehealth: Payer: Self-pay

## 2015-11-28 ENCOUNTER — Ambulatory Visit (INDEPENDENT_AMBULATORY_CARE_PROVIDER_SITE_OTHER): Payer: PPO | Admitting: Family Medicine

## 2015-11-28 VITALS — BP 112/66 | HR 94 | Temp 97.8°F | Wt 181.2 lb

## 2015-11-28 DIAGNOSIS — Z7189 Other specified counseling: Secondary | ICD-10-CM | POA: Diagnosis not present

## 2015-11-28 DIAGNOSIS — J209 Acute bronchitis, unspecified: Secondary | ICD-10-CM | POA: Diagnosis not present

## 2015-11-28 MED ORDER — AZITHROMYCIN 250 MG PO TABS: ORAL_TABLET | ORAL | Status: DC

## 2015-11-28 NOTE — Telephone Encounter (Signed)
Pt has appt 11/28/15 at 11:30 with Dr Damita Dunnings.

## 2015-11-28 NOTE — Assessment & Plan Note (Signed)
Nontoxic.   Presumed bronchitis.  Continue honey as needed for cough, drink fluids.  Start zithromax.  F/u prn.  He agrees.

## 2015-11-28 NOTE — Telephone Encounter (Signed)
Patient scheduled appointment today at 11:30.

## 2015-11-28 NOTE — Progress Notes (Signed)
Pre visit review using our clinic review tool, if applicable. No additional management support is needed unless otherwise documented below in the visit note.  Still with cough, some better this AM.  Now with sputum, yellow, was worse of the weekend.   Had a fever recently, yesterday, some better with ibuprofen, up and down.  No vomiting.  No diarrhea.   Fatigued, "worn out."   No rash.   Had a cold sore come on his upper lip.    Meds, vitals, and allergies reviewed.   ROS: See HPI.  Otherwise, noncontributory.  GEN: nad, alert and oriented HEENT: mucous membranes moist, tm w/o erythema, nasal exam w/o erythema, clear discharge noted,  OP with cobblestoning NECK: supple w/o LA CV: rrr.   PULM: ctab except for coarse BS in the RLL, no inc wob EXT: no edema SKIN: no acute rash

## 2015-11-28 NOTE — Telephone Encounter (Signed)
PLEASE NOTE: All timestamps contained within this report are represented as [place] Standard Time. CONFIDENTIALTY NOTICE: This fax transmission is intended only for the addressee. It contains information that is legally privileged, confidential or otherwise protected from use or disclosure. If you are not the intended recipient, you are strictly prohibited from reviewing, disclosing, copying using or disseminating any of this information or taking any action in reliance on or regarding this information. If you have received this fax in error, please notify us immediately by telephone so that we can arrange for its return to [place]. Phone: [phone], Toll-Free: [phone], Fax: [phone] Page: 1 of 2 Call Id: [id number] [place] Patient Name: Shane Stewart Gender: Male DOB: Oct 27, 1945 Age: 70 Y 52 M 4 D Return Phone Number: [phone] (Primary), [phone] (Secondary) Address: City/State/Zip: [place] Client [facility] - Client Client Site [facility] - Day Physician Renford Dills Contact Type Call Who Is Calling Patient / Member / Family / Caregiver Call Type Triage / Clinical Caller Name Arles Relationship To Patient Self Return Phone Number [phone] (Primary) Chief Complaint Coughing Up Blood Reason for Call Symptomatic / Request for [place] states have a bad cough and cold coughing up blood Appointment Disposition EMR Appointment Not Necessary Info pasted into Epic No PreDisposition Call a family member Translation No Nurse Assessment Nurse: Tawanna Solo, RN, Vaughan Basta Date/Time ([place] Time): 11/26/2015 12:02:26 PM Confirm and document reason for call. If symptomatic, describe symptoms. You must click the next button to save text entered. ---Caller saw Dr. Damita Dunnings yesterday. Negative for flu. Cough is hard.  Coughed up blood this morning. Temp was 99.1 and took ibuprofen. Has the patient traveled out of the country within the last 30 days? ---No Does the patient have any new or worsening symptoms? ---Yes Will a triage be completed? ---Yes Related visit to physician within the last 2 weeks? ---Yes Does the PT have any chronic conditions? (i.e. diabetes, asthma, etc.) ---No Is this a behavioral health or substance abuse call? ---No Guidelines Guideline Title Affirmed Question Affirmed Notes Nurse Date/Time (Eastern Time) Coughing Up Blood Few streaks of blood mixed in with yellow or green sputum (all other triage questions negative) Tawanna Solo, RN, Vaughan Basta 11/26/2015 12:04:58 PM Disp. Time Eilene Ghazi Time) Disposition Final User 11/26/2015 12:10:16 PM Home Care Yes Tawanna Solo, RN, Vaughan Basta PLEASE NOTE: All timestamps contained within this report are represented as [place] Standard Time. CONFIDENTIALTY NOTICE: This fax transmission is intended only for the addressee. It contains information that is legally privileged, confidential or otherwise protected from use or disclosure. If you are not the intended recipient, you are strictly prohibited from reviewing, disclosing, copying using or disseminating any of this information or taking any action in reliance on or regarding this information. If you have received this fax in error, please notify us immediately by telephone so that we can arrange for its return to [place]. Phone: [phone], Toll-Free: [phone], Fax: [phone] Page: 2 of 2 Call Id: [id number] Caller Understands: Yes Disagree/Comply: Comply Care Advice Given Per Guideline HOME CARE: You should be able to treat this at home. REASSURANCE: It doesn't sound like a serious cough. Coughing up mucus is very important for protecting the lungs from pneumonia. And, it is not uncommon during a respiratory infection to have some streaks of blood mixed in with the phlegm. The bleeding occurs  because the airways are irritated. COUGHING SPASMS: Drink warm  fluids. Inhale warm mist. (Reason: both relax the airway and loosen up the phlegm) Suck on cough drops or hard candy to coat the irritated throat. HUMIDIFIER: If the air is dry, use a humidifier in the bedroom. (Reason: dry air makes coughs worse) CALL BACK IF: * You have difficulty breathing * Blood in sputum continues or occurs again * You cough up more than a tablespoon of pure red blood * You have a cough that lasts over 3 weeks * You become worse. CARE ADVICE given per Coughing Up Blood guideline.

## 2015-11-28 NOTE — Patient Instructions (Signed)
Presumed bronchitis.  Continue honey, drink fluids.   Start zithromax.  Take care.  Update me as needed.

## 2015-12-01 ENCOUNTER — Other Ambulatory Visit (INDEPENDENT_AMBULATORY_CARE_PROVIDER_SITE_OTHER): Payer: PPO

## 2015-12-01 DIAGNOSIS — E7800 Pure hypercholesterolemia, unspecified: Secondary | ICD-10-CM

## 2015-12-01 LAB — LIPID PANEL
CHOL/HDL RATIO: 4
Cholesterol: 166 mg/dL (ref 0–200)
HDL: 46.9 mg/dL (ref >39.00)
LDL CALC: 101 mg/dL — AB (ref 0–99)
NONHDL: 119.47
Triglycerides: 90 mg/dL (ref 0.0–149.0)
VLDL: 18 mg/dL (ref 0.0–40.0)

## 2015-12-01 LAB — COMPREHENSIVE METABOLIC PANEL
ALK PHOS: 69 U/L (ref 39–117)
ALT: 40 U/L (ref 0–53)
AST: 26 U/L (ref 0–37)
Albumin: 4.2 g/dL (ref 3.5–5.2)
BUN: 15 mg/dL (ref 6–23)
CHLORIDE: 103 meq/L (ref 96–112)
CO2: 29 mEq/L (ref 19–32)
Calcium: 9.1 mg/dL (ref 8.4–10.5)
Creatinine, Ser: 0.87 mg/dL (ref 0.40–1.50)
GFR: 92.56 mL/min (ref >60.00)
GLUCOSE: 108 mg/dL — AB (ref 70–99)
POTASSIUM: 4.4 meq/L (ref 3.5–5.1)
SODIUM: 139 meq/L (ref 135–145)
TOTAL PROTEIN: 6.6 g/dL (ref 6.0–8.3)
Total Bilirubin: 0.5 mg/dL (ref 0.2–1.2)

## 2015-12-08 ENCOUNTER — Ambulatory Visit (INDEPENDENT_AMBULATORY_CARE_PROVIDER_SITE_OTHER): Payer: PPO | Admitting: Family Medicine

## 2015-12-08 ENCOUNTER — Encounter: Payer: Self-pay | Admitting: Family Medicine

## 2015-12-08 VITALS — BP 122/66 | HR 78 | Temp 98.2°F | Ht 68.0 in | Wt 179.5 lb

## 2015-12-08 DIAGNOSIS — Z23 Encounter for immunization: Secondary | ICD-10-CM

## 2015-12-08 DIAGNOSIS — Z0000 Encounter for general adult medical examination without abnormal findings: Secondary | ICD-10-CM | POA: Diagnosis not present

## 2015-12-08 DIAGNOSIS — Z119 Encounter for screening for infectious and parasitic diseases, unspecified: Secondary | ICD-10-CM

## 2015-12-08 DIAGNOSIS — E7800 Pure hypercholesterolemia, unspecified: Secondary | ICD-10-CM

## 2015-12-08 DIAGNOSIS — Z7189 Other specified counseling: Secondary | ICD-10-CM

## 2015-12-08 NOTE — Patient Instructions (Signed)
Recheck labs in about 1 year.   Take care.  Glad to see you.  Update me as needed.

## 2015-12-08 NOTE — Progress Notes (Signed)
Pre visit review using our clinic review tool, if applicable. No additional management support is needed unless otherwise documented below in the visit note.  I have personally reviewed the Medicare Annual Wellness questionnaire and have noted 1. The patient's medical and social history 2. Their use of alcohol, tobacco or illicit drugs 3. Their current medications and supplements 4. The patient's functional ability including ADL's, fall risks, home safety risks and hearing or visual             impairment. 5. Diet and physical activities 6. Evidence for depression or mood disorders  The patients weight, height, BMI have been recorded in the chart and visual acuity is per eye clinic.  I have made referrals, counseling and provided education to the patient based review of the above and I have provided the pt with a written personalized care plan for preventive services.  Provider list updated- see scanned forms.  Routine anticipatory guidance given to patient.  See health maintenance.  Flu 2016 Shingles 2014 PNA 2017 Tetanus 2011 Colonoscopy 2015 Prostate cancer screening and PSA options (with potential risks and benefits of testing vs not testing) were discussed along with recent recs/guidelines.  He declined testing PSA at this point. Advance directive- would have his friend [name] designated if patient were incapacitated.   Cognitive function addressed- see scanned forms- and if abnormal then additional documentation follows.  Pt opts in for HCV screening.  D/w pt re: routine screening.    Recently with sensation of spinning.  He ate some M&M and it resolved.  He hadn't had much to eat prev.  It got better quickly with eating M&M candy.  No other sx.    He is off statin.  He wanted to stay off for now and recheck next year.  This is reasonable.   PMH and SH reviewed  Meds, vitals, and allergies reviewed.   ROS: See HPI.  Otherwise negative.    GEN: nad, alert and  oriented HEENT: mucous membranes moist NECK: supple w/o LA CV: rrr. PULM: ctab, no inc wob ABD: soft, +bs EXT: no edema SKIN: no acute rash

## 2015-12-12 NOTE — Assessment & Plan Note (Addendum)
Flu 2016 Shingles 2014 PNA 2017 Tetanus 2011 Colonoscopy 2015 Prostate cancer screening and PSA options (with potential risks and benefits of testing vs not testing) were discussed along with recent recs/guidelines.  He declined testing PSA at this point. Advance directive- would have his friend [name] designated if patient were incapacitated.   Cognitive function addressed- see scanned forms- and if abnormal then additional documentation follows.  Pt opts in for HCV screening.  D/w pt re: routine screening.   I think he likely had an episode of brief hypoglycemia, resolved with a snack.  He'll monitor for sx.  No other events in the meantime.

## 2015-12-12 NOTE — Assessment & Plan Note (Signed)
Continue off statin, continue work on diet and exercise, labs d/w pt.  We can recheck next year.  He agrees.

## 2015-12-30 ENCOUNTER — Telehealth: Payer: Self-pay | Admitting: Family Medicine

## 2015-12-30 NOTE — Telephone Encounter (Signed)
Patient Name: Shane Stewart DOB: 09-20-1946 Initial Comment Caller states been having some light headedness, back of neck hurting, up toward skull Nurse Assessment Nurse: Vallery Sa, RN, [place] Date/Time (Eastern Time): 12/30/2015 10:08:09 AM Confirm and document reason for call. If symptomatic, describe symptoms. You must click the next button to save text entered. ---Caller states he has had neck pain for the past two weeks (rated as a 3 on the 1 to 10 scale). No recent injury. No fever. He has started feeling lightheaded in the mornings for the past two weeks. No breathing difficulty. No chest pain. Alert and responsive. Has the patient traveled out of the country within the last 30 days? ---No Does the patient have any new or worsening symptoms? ---Yes Will a triage be completed? ---Yes Related visit to physician within the last 2 weeks? ---No Does the PT have any chronic conditions? (i.e. diabetes, asthma, etc.) ---No Is this a behavioral health or substance abuse call? ---No Guidelines Guideline Title Affirmed Question Affirmed Notes Neck Pain or Stiffness [1] MODERATE neck pain (e.g., interferes with normal activities AND [2] present > 3 days Dizziness - Lightheadedness [1] MODERATE dizziness (e.g., interferes with normal activities) AND [2] has NOT been evaluated by physician for this (Exception: dizziness caused by heat exposure, sudden standing, or poor fluid intake) Final Disposition User See PCP in 24 Hours.Vallery Sa, RN, Cathy Referrals Unable to schedule appointment. Caller plans to go to [facility]. Disagree/Comply: Comply Disagree/Comply: Comply

## 2015-12-30 NOTE — Telephone Encounter (Signed)
Agree with dispo 

## 2015-12-30 NOTE — Telephone Encounter (Signed)
Dr Damita Dunnings out of office and sending note to Dr Diona Browner; per [facility] note pt plans to be seen at [facility].

## 2016-01-05 ENCOUNTER — Telehealth: Payer: Self-pay | Admitting: Family Medicine

## 2016-01-05 ENCOUNTER — Ambulatory Visit: Payer: PPO | Admitting: Family Medicine

## 2016-01-05 NOTE — Telephone Encounter (Signed)
Patient advised.

## 2016-01-05 NOTE — Telephone Encounter (Signed)
If going on for a few weeks, and has sensation of room spinning with position change, then likely okay to try PRN meclizine 12.5mg  tid prn in the meantime with sedation caution.  Thanks.

## 2016-01-05 NOTE — Telephone Encounter (Signed)
I called pt and offered appt today at [facility] with Dr Lorelei Pont; pt said he could not come at that time and kept appt for 01/09/16 with Dr Damita Dunnings; advised pt if condition changes or worsens prior to appt to go to [facility] or ED. Pt voiced understanding.

## 2016-01-05 NOTE — Telephone Encounter (Signed)
Patient Name: Shane Stewart  DOB: Aug 22, 1946    Initial Comment Caller states thinks he has vertigo    Nurse Assessment  Nurse: Orvan Seen, RN, [place] Date/Time (Eastern Time): 01/05/2016 9:16:20 AM  Confirm and document reason for call. If symptomatic, describe symptoms. You must click the next button to save text entered. ---Caller states thinks he has vertigo- Caller states he feels unbalance when he is getting up. Symptoms started a couple of weeks ago.  Has the patient traveled out of the country within the last 30 days? ---No  Does the patient have any new or worsening symptoms? ---Yes  Will a triage be completed? ---Yes  Related visit to physician within the last 2 weeks? ---No  Does the PT have any chronic conditions? (i.e. diabetes, asthma, etc.) ---No  Is this a behavioral health or substance abuse call? ---No     Guidelines    Guideline Title Affirmed Question Affirmed Notes  Dizziness - Lightheadedness [1] MILD dizziness (e.g., walking normally) AND [2] has NOT been evaluated by physician for this (Exception: dizziness caused by heat exposure, sudden standing, or poor fluid intake)    Final Disposition User   See PCP When Office is Open (within 3 days) Orvan Seen, RN, Jacquilin    Comments  Appointment made with PCP Damita Dunnings Monday at 0930- Attempted to schedule earlier but not appointments open - Please follow up if something sooner opens due to outcome    Referrals  REFERRED TO PCP OFFICE   Disagree/Comply: Comply

## 2016-01-09 ENCOUNTER — Encounter: Payer: Self-pay | Admitting: Family Medicine

## 2016-01-09 ENCOUNTER — Ambulatory Visit (INDEPENDENT_AMBULATORY_CARE_PROVIDER_SITE_OTHER): Payer: PPO | Admitting: Family Medicine

## 2016-01-09 VITALS — BP 122/68 | HR 76 | Temp 98.3°F | Wt 183.0 lb

## 2016-01-09 DIAGNOSIS — R2681 Unsteadiness on feet: Secondary | ICD-10-CM | POA: Diagnosis not present

## 2016-01-09 NOTE — Patient Instructions (Signed)
You had an unremarkable exam today.  I would like to consider your care and then we'll be in touch.  The question is CT head vs ENT referral.  Take care. Don't change your meds for now.  I would have you check your pillow and gently stretch your neck for the occiput pain you are having.

## 2016-01-09 NOTE — Progress Notes (Signed)
Pre visit review using our clinic review tool, if applicable. No additional management support is needed unless otherwise documented below in the visit note.  Sx going on for a few weeks.  Sensation of being unbalanced, brief, to either side, not just to one side.  Unsteady.  Episodic, not constant.  Not presyncopal.  No sx yesterday.  Sx tend to be worse in the AM, after getting out of bed.  No sx with rolling over.   He tried meclizine in the meantime, at night, due to sedation.  Unclear if benefited patient.  No ear ringing.  No FCNAV.  No ear pain.  Some HA, pressure near the eyes and his forehead felt warm w/o fever.  No numbness, no tingling.  No paresthesia, no weakness.  Hasn't tried any meds for HA.   Sugar has been normal on home checks, <100, usually <90.   Prev spinning episode d/w pt at 12/08/15 OV clearly felt different.    Meds, vitals, and allergies reviewed.   ROS: See HPI.  Otherwise, noncontributory.  GEN: nad, alert and oriented HEENT: mucous membranes moist NECK: supple w/o LA CV: rrr.  no murmur PULM: ctab, no inc wob ABD: soft, +bs EXT: no edema CN 2-12 wnl B, S/S/DTR wnl x4

## 2016-01-10 DIAGNOSIS — R2681 Unsteadiness on feet: Secondary | ICD-10-CM | POA: Insufficient documentation

## 2016-01-10 NOTE — Assessment & Plan Note (Signed)
Unremarkable exam, no bruit, DHP neg.   He does have some incidental occipital tenderness, bilaterally, not in midline, likely from muscle tension- d/w pt about stretching.  This appears to be incidental.   D/w pt- I wanted to consider his case.  This doesn't appear to be sensation related- ie not a neuropathy.  Would be reasonable to check head CT given the ongoing sx.  Ordered.   This doesn't look like typical vertigo.   >25 minutes spent in face to face time with patient, >50% spent in counselling or coordination of care.

## 2016-01-16 ENCOUNTER — Ambulatory Visit
Admission: RE | Admit: 2016-01-16 | Discharge: 2016-01-16 | Disposition: A | Discharge status: Home / Self Care | Payer: PPO | Source: Ambulatory Visit | Attending: Family Medicine | Admitting: Family Medicine

## 2016-01-16 DIAGNOSIS — R2681 Unsteadiness on feet: Secondary | ICD-10-CM | POA: Insufficient documentation

## 2016-01-16 DIAGNOSIS — R269 Unspecified abnormalities of gait and mobility: Secondary | ICD-10-CM | POA: Diagnosis not present

## 2016-01-16 IMAGING — CT CT HEAD W/O CM
2 series · 15 of 30 positions shown, 17 images · non-contrast
Comparison: None.

CLINICAL DATA: Unsteady gait.

EXAM:
CT HEAD WITHOUT CONTRAST
TECHNIQUE: Contiguous axial images were obtained from the base of the skull
through the vertex without intravenous contrast.

[Series 2: head wo · axial · 0.42mm/px · z∈[-36,+64]mm · 7 of 28 slices shown, 9 images]
[im 4/28  brain]
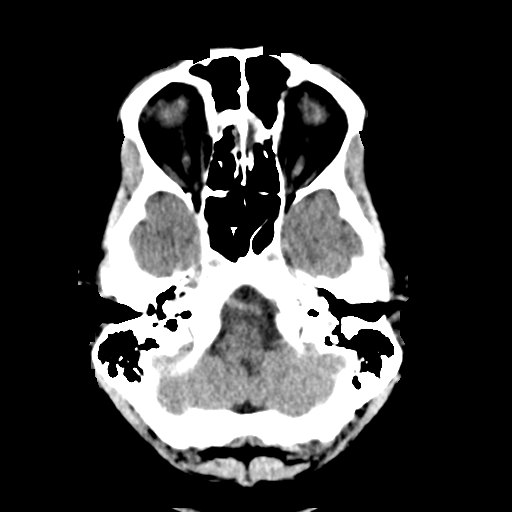
[im 4/28  bone]
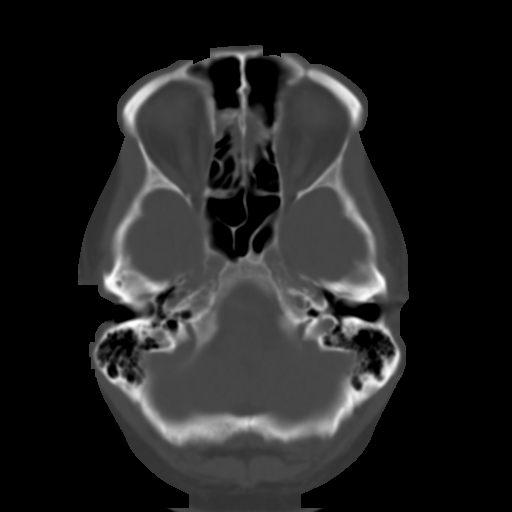
[im 7/28  brain]
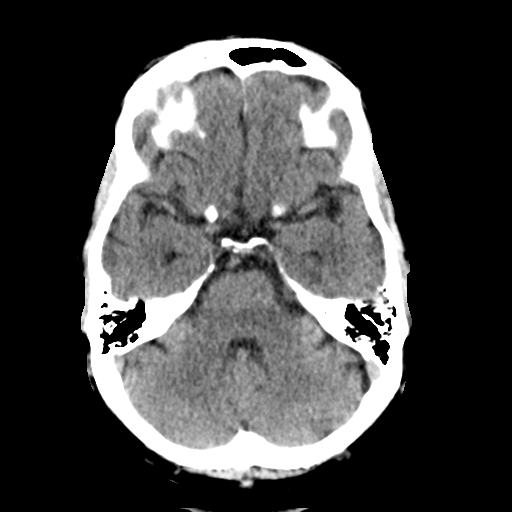
[im 11/28  brain]
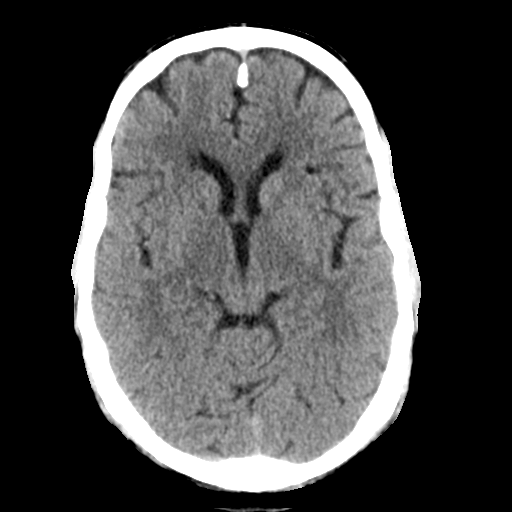
[im 14/28  brain]
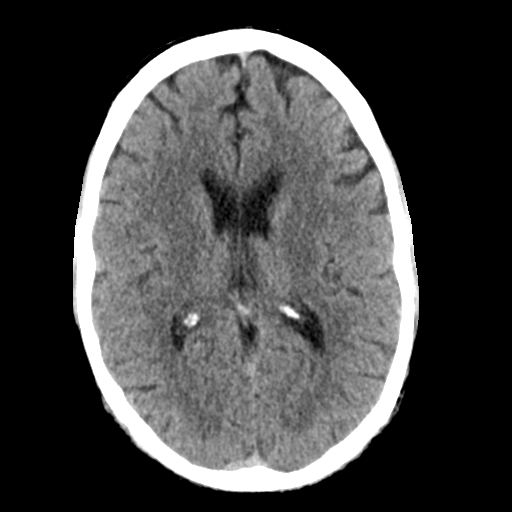
[im 17/28  brain]
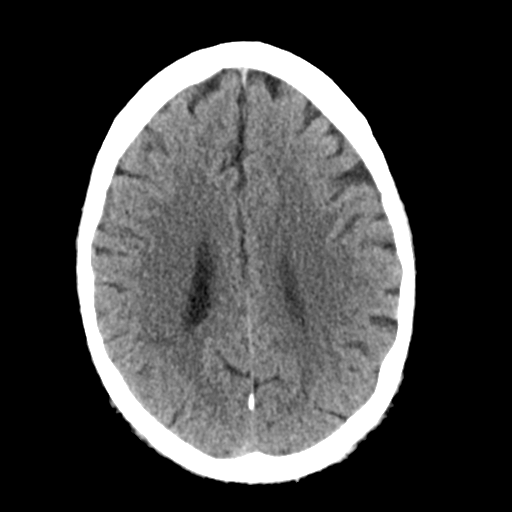
[im 17/28  bone]
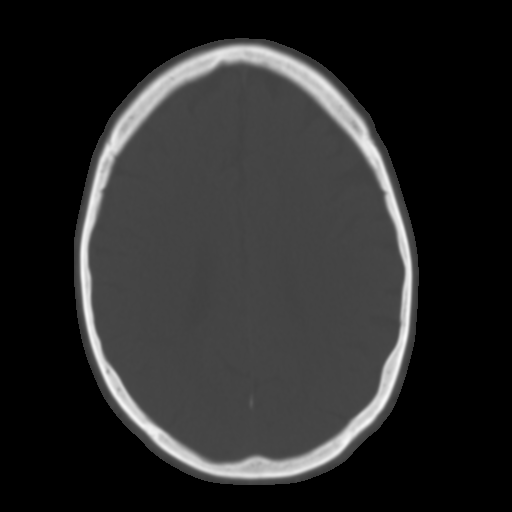
[im 21/28  brain]
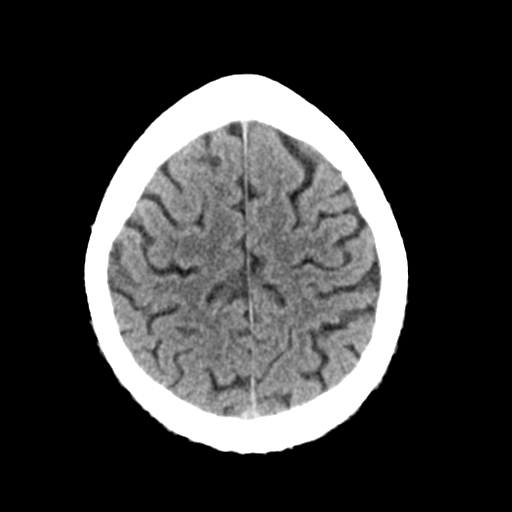
[im 24/28  brain]
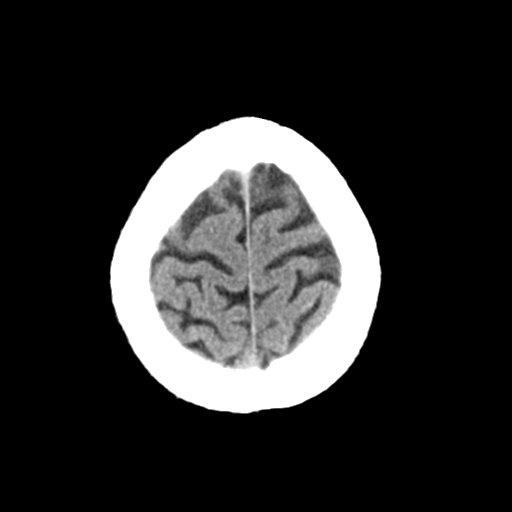

[Series 3: head bone · axial · 0.42mm/px · z∈[-39,+73]mm · 8 of 70 slices shown]
[im 7/70  bone]
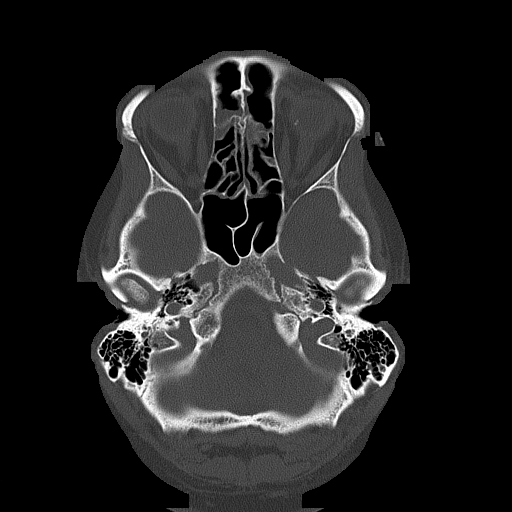
[im 14/70  bone]
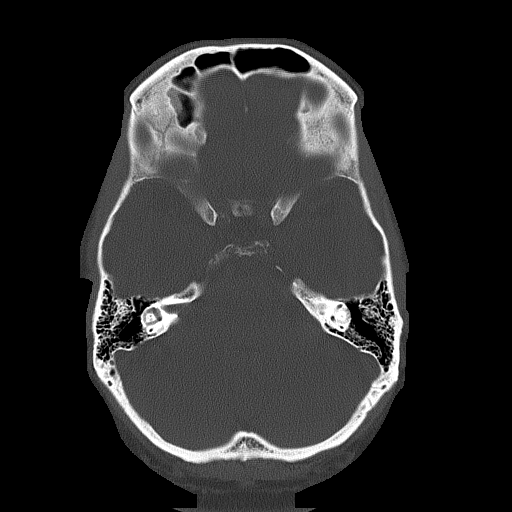
[im 21/70  bone]
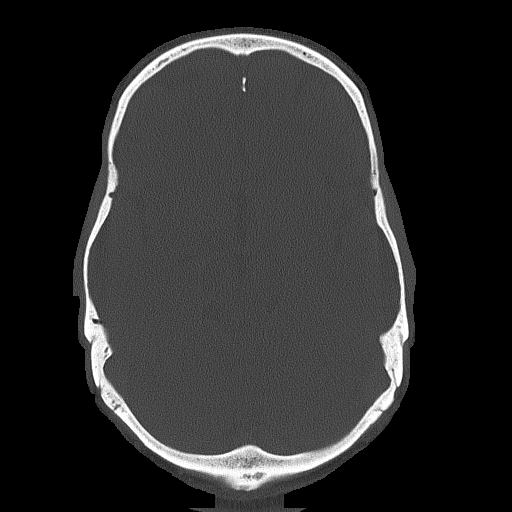
[im 32/70  bone]
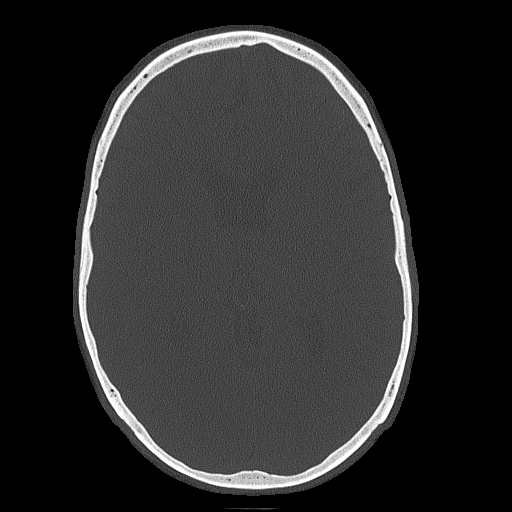
[im 38/70  bone]
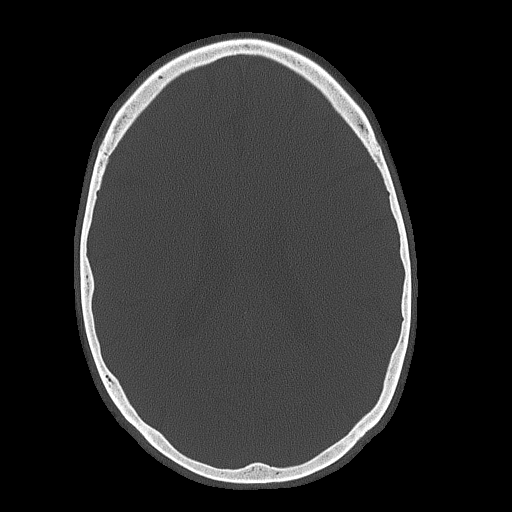
[im 49/70  bone]
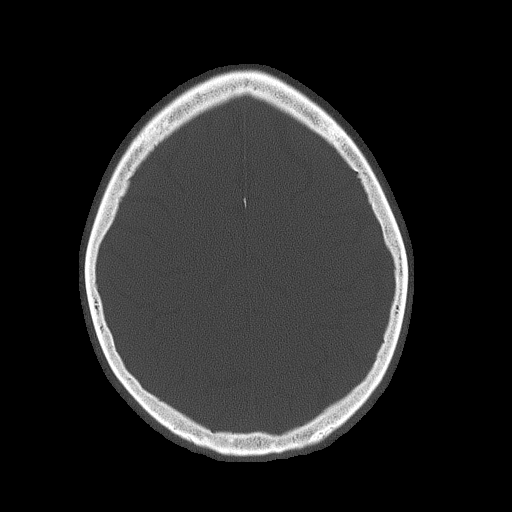
[im 56/70  bone]
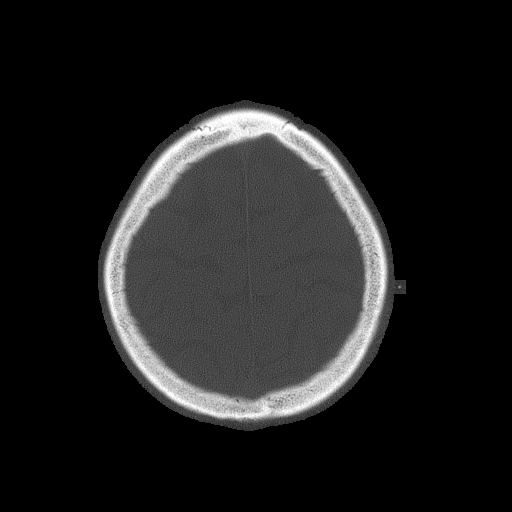
[im 63/70  bone]
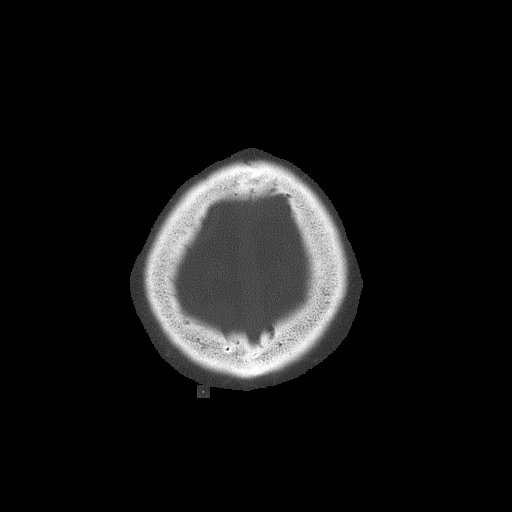

[15 of 30 positions shown; findings below may reference images not displayed]

FINDINGS: No acute cortical infarct, hemorrhage, or mass lesion ispresent.
Ventricles are of normal size. No significant extra-axial fluid
collection is present. Partial opacification of the anterior right
ethmoid air cells. The osseous skull is intact.
IMPRESSION: 1. Normal brain.

## 2016-01-17 ENCOUNTER — Encounter: Payer: Self-pay | Admitting: Family Medicine

## 2016-09-28 DIAGNOSIS — D3132 Benign neoplasm of left choroid: Secondary | ICD-10-CM | POA: Diagnosis not present

## 2016-09-28 DIAGNOSIS — H2513 Age-related nuclear cataract, bilateral: Secondary | ICD-10-CM | POA: Diagnosis not present

## 2016-12-03 ENCOUNTER — Other Ambulatory Visit: Payer: Self-pay | Admitting: Family Medicine

## 2016-12-03 DIAGNOSIS — E7800 Pure hypercholesterolemia, unspecified: Secondary | ICD-10-CM

## 2016-12-04 ENCOUNTER — Ambulatory Visit: Payer: PPO

## 2016-12-05 ENCOUNTER — Ambulatory Visit (INDEPENDENT_AMBULATORY_CARE_PROVIDER_SITE_OTHER): Payer: PPO

## 2016-12-05 VITALS — BP 114/70 | HR 67 | Temp 97.9°F | Ht 68.25 in | Wt 170.8 lb

## 2016-12-05 DIAGNOSIS — Z119 Encounter for screening for infectious and parasitic diseases, unspecified: Secondary | ICD-10-CM

## 2016-12-05 DIAGNOSIS — E7800 Pure hypercholesterolemia, unspecified: Secondary | ICD-10-CM

## 2016-12-05 DIAGNOSIS — Z0000 Encounter for general adult medical examination without abnormal findings: Secondary | ICD-10-CM | POA: Diagnosis not present

## 2016-12-05 LAB — COMPREHENSIVE METABOLIC PANEL
ALK PHOS: 72 U/L (ref 39–117)
ALT: 16 U/L (ref 0–53)
AST: 14 U/L (ref 0–37)
Albumin: 4.1 g/dL (ref 3.5–5.2)
BUN: 16 mg/dL (ref 6–23)
CO2: 31 meq/L (ref 19–32)
Calcium: 9.4 mg/dL (ref 8.4–10.5)
Chloride: 103 mEq/L (ref 96–112)
Creatinine, Ser: 0.91 mg/dL (ref 0.40–1.50)
GFR: 87.62 mL/min (ref >60.00)
GLUCOSE: 108 mg/dL — AB (ref 70–99)
POTASSIUM: 4 meq/L (ref 3.5–5.1)
SODIUM: 139 meq/L (ref 135–145)
TOTAL PROTEIN: 6.4 g/dL (ref 6.0–8.3)
Total Bilirubin: 0.7 mg/dL (ref 0.2–1.2)

## 2016-12-05 LAB — LIPID PANEL
Cholesterol: 167 mg/dL (ref 0–200)
HDL: 49.2 mg/dL (ref >39.00)
LDL Cholesterol: 105 mg/dL — ABNORMAL HIGH (ref 0–99)
NONHDL: 118.19
TRIGLYCERIDES: 67 mg/dL (ref 0.0–149.0)
Total CHOL/HDL Ratio: 3
VLDL: 13.4 mg/dL (ref 0.0–40.0)

## 2016-12-05 NOTE — Progress Notes (Signed)
Subjective:   Shane Stewart is a 71 y.o. male who presents for Medicare Annual/Subsequent preventive examination.  Review of Systems:  N/A Cardiac Risk Factors include: advanced age (>45men, >14 women);male gender;dyslipidemia     Objective:    Vitals: BP 114/70 (BP Location: Right Arm, Patient Position: Sitting, Cuff Size: Normal)   Pulse 67   Temp 97.9 F (36.6 C) (Oral)   Ht 5' 8.25" (1.734 m) Comment: no shoes  Wt 170 lb 12 oz (77.5 kg)   SpO2 98%   BMI 25.77 kg/m   Body mass index is 25.77 kg/m.  Tobacco History  Smoking Status  . Never Smoker  Smokeless Tobacco  . Never Used     Counseling given: No   Past Medical History:  Diagnosis Date  . Diverticulosis of colon 01/1999  . History of CT scan 04/1985   L/S, H/P L5/S1  . Hyperlipidemia   . Right knee pain    better with hyaluronic acid  . Rosacea    Past Surgical History:  Procedure Laterality Date  . LUMBAR SPINE SURGERY  1986   LS ruptured dis repair  . UMBILICAL HERNIA REPAIR  10/05/2005   Family History  Problem Relation Age of Onset  . Alzheimer's disease Mother   . Dementia Mother   . Cancer Father     colon  . Heart disease Father     CAD  . Diabetes Father   . Colon cancer Father 42  . Diabetes Sister   . Heart disease Sister     MI CABG x 3  . Hypertension Sister   . Prostate cancer Neg Hx    History  Sexual Activity  . Sexual activity: Yes    Outpatient Encounter Prescriptions as of 12/05/2016  Medication Sig  . cholecalciferol (VITAMIN D) 1000 UNITS tablet Take 4,000 Units by mouth daily. Reported on 12/08/2015  . co-enzyme Q-10 30 MG capsule Take 30 mg by mouth daily.  Marland Kitchen Hyaluronic Acid 400-80-40 MG CAPS Take one by mouth 3-4 times a week   . metroNIDAZOLE (METROGEL) 0.75 % gel Apply topically daily. Apply to affected area daily as needed  . Multiple Vitamin (MULTIVITAMIN) tablet Take 1 tablet by mouth daily.  Marland Kitchen triamcinolone cream (KENALOG) 0.5 % Apply 1 application  topically 2 (two) times daily as needed.  . TURMERIC PO Take by mouth daily.  . vitamin B-12 (CYANOCOBALAMIN) 1000 MCG tablet Take 1,000 mcg by mouth daily.   No facility-administered encounter medications on file as of 12/05/2016.     Activities of Daily Living In your present state of health, do you have any difficulty performing the following activities: 12/05/2016  Hearing? Y  Vision? N  Difficulty concentrating or making decisions? N  Walking or climbing stairs? N  Dressing or bathing? N  Doing errands, shopping? N  Preparing Food and eating ? N  Using the Toilet? N  In the past six months, have you accidently leaked urine? N  Do you have problems with loss of bowel control? N  Managing your Medications? N  Managing your Finances? N  Housekeeping or managing your Housekeeping? N  Some recent data might be hidden    Patient Care Team: Tonia Ghent, MD as PCP - General (Family Medicine) Thelma Comp, OD as Consulting Physician (Optometry)   Assessment:     Hearing Screening   125Hz  250Hz  500Hz  1000Hz  2000Hz  3000Hz  4000Hz  6000Hz  8000Hz   Right ear:   40 40 40  0  Left ear:   40 40 40  0    Vision Screening Comments: Last vision exam in Jan 2018 with Dr. Maryruth Hancock B   Exercise Activities and Dietary recommendations Current Exercise Habits: Home exercise routine, Type of exercise: strength training/weights;stretching;Other - see comments (aerobics), Time (Minutes): 60, Frequency (Times/Week): 3, Weekly Exercise (Minutes/Week): 180, Intensity: Moderate, Exercise limited by: None identified  Goals    . Increase physical activity          Starting 12/05/2016, I will continue to exercise at least 60 min 3 days per week.       Fall Risk Fall Risk  12/05/2016 12/08/2015 12/07/2014 12/03/2013 11/13/2012  Falls in the past year? No No No No No   Depression Screen PHQ 2/9 Scores 12/05/2016 12/08/2015 12/07/2014 12/03/2013  PHQ - 2 Score 0 0 0 0    Cognitive Function MMSE -  Mini Mental State Exam 12/05/2016  Orientation to time 5  Orientation to Place 5  Registration 3  Attention/ Calculation 0  Recall 3  Language- name 2 objects 0  Language- repeat 1  Language- follow 3 step command 3  Language- read & follow direction 0  Write a sentence 0  Copy design 0  Total score 20     PLEASE NOTE: A Mini-Cog screen was completed. Maximum score is 20. A value of 0 denotes this part of Folstein MMSE was not completed or the patient failed this part of the Mini-Cog screening.   Mini-Cog Screening Orientation to Time - Max 5 pts Orientation to Place - Max 5 pts Registration - Max 3 pts Recall - Max 3 pts Language Repeat - Max 1 pts Language Follow 3 Step Command - Max 3 pts     Immunization History  Administered Date(s) Administered  . H1N1 10/25/2008  . Hepatitis A 09/22/2008, 03/24/2009  . Hepatitis B 09/22/2008, 10/25/2008, 03/24/2009  . Influenza Whole 07/08/2002, 07/07/2008, 07/19/2010  . Influenza,inj,Quad PF,36+ Mos 07/09/2013  . Influenza-Unspecified 07/06/2014, 06/28/2015, 06/24/2016  . Pneumococcal Conjugate-13 10/21/2012, 12/07/2014  . Pneumococcal Polysaccharide-23 12/08/2015  . Td 02/15/2003, 10/25/2009  . Tdap 11/28/2009  . Zoster 03/24/2013   Screening Tests Health Maintenance  Topic Date Due  . COLONOSCOPY  07/30/2019  . TETANUS/TDAP  11/29/2019  . INFLUENZA VACCINE  Addressed  . Hepatitis C Screening  Completed  . PNA vac Low Risk Adult  Completed      Plan:     I have personally reviewed and addressed the Medicare Annual Wellness questionnaire and have noted the following in the patient's chart:  A. Medical and social history B. Use of alcohol, tobacco or illicit drugs  C. Current medications and supplements D. Functional ability and status E.  Nutritional status F.  Physical activity G. Advance directives H. List of other physicians I.  Hospitalizations, surgeries, and ER visits in previous 12 months J.   Lexa to include hearing, vision, cognitive, depression L. Referrals and appointments - none  In addition, I have reviewed and discussed with patient certain preventive protocols, quality metrics, and best practice recommendations. A written personalized care plan for preventive services as well as general preventive health recommendations were provided to patient.  See attached scanned questionnaire for additional information.   Signed,   Lindell Noe, MHA, BS, LPN Health Coach

## 2016-12-05 NOTE — Patient Instructions (Signed)
Mr. Reason , Thank you for taking time to come for your Medicare Wellness Visit. I appreciate your ongoing commitment to your health goals. Please review the following plan we discussed and let me know if I can assist you in the future.   These are the goals we discussed: Goals    . Increase physical activity          Starting 12/05/2016, I will continue to exercise at least 60 min 3 days per week.        This is a list of the screening recommended for you and due dates:  Health Maintenance  Topic Date Due  . Colon Cancer Screening  07/30/2019  . Tetanus Vaccine  11/29/2019  . Flu Shot  Addressed  .  Hepatitis C: One time screening is recommended by Center for Disease Control  (CDC) for  adults born from [birth year] through 1964.   Completed  . Pneumonia vaccines  Completed   Preventive Care for Adults  A healthy lifestyle and preventive care can promote health and wellness. Preventive health guidelines for adults include the following key practices.  . A routine yearly physical is a good way to check with your health care provider about your health and preventive screening. It is a chance to share any concerns and updates on your health and to receive a thorough exam.  . Visit your dentist for a routine exam and preventive care every 6 months. Brush your teeth twice a day and floss once a day. Good oral hygiene prevents tooth decay and gum disease.  . The frequency of eye exams is based on your age, health, family medical history, use  of contact lenses, and other factors. Follow your health care provider's ecommendations for frequency of eye exams.  . Eat a healthy diet. Foods like vegetables, fruits, whole grains, low-fat dairy products, and lean protein foods contain the nutrients you need without too many calories. Decrease your intake of foods high in solid fats, added sugars, and salt. Eat the right amount of calories for you. Get information about a proper diet from your health care  provider, if necessary.  . Regular physical exercise is one of the most important things you can do for your health. Most adults should get at least 150 minutes of moderate-intensity exercise (any activity that increases your heart rate and causes you to sweat) each week. In addition, most adults need muscle-strengthening exercises on 2 or more days a week.  Silver Sneakers may be a benefit available to you. To determine eligibility, you may visit the website: www.silversneakers.com or contact program at (717)129-6248 Mon-Fri between 8AM-8PM.   . Maintain a healthy weight. The body mass index (BMI) is a screening tool to identify possible weight problems. It provides an estimate of body fat based on height and weight. Your health care provider can find your BMI and can help you achieve or maintain a healthy weight.   For adults 20 years and older: ? A BMI below 18.5 is considered underweight. ? A BMI of 18.5 to 24.9 is normal. ? A BMI of 25 to 29.9 is considered overweight. ? A BMI of 30 and above is considered obese.   . Maintain normal blood lipids and cholesterol levels by exercising and minimizing your intake of saturated fat. Eat a balanced diet with plenty of fruit and vegetables. Blood tests for lipids and cholesterol should begin at age 33 and be repeated every 5 years. If your lipid or cholesterol levels are high,  you are over 50, or you are at high risk for heart disease, you may need your cholesterol levels checked more frequently. Ongoing high lipid and cholesterol levels should be treated with medicines if diet and exercise are not working.  . If you smoke, find out from your health care provider how to quit. If you do not use tobacco, please do not start.  . If you choose to drink alcohol, please do not consume more than 2 drinks per day. One drink is considered to be 12 ounces (355 mL) of beer, 5 ounces (148 mL) of wine, or 1.5 ounces (44 mL) of liquor.  . If you are 34-79 years  old, ask your health care provider if you should take aspirin to prevent strokes.  . Use sunscreen. Apply sunscreen liberally and repeatedly throughout the day. You should seek shade when your shadow is shorter than you. Protect yourself by wearing long sleeves, pants, a wide-brimmed hat, and sunglasses year round, whenever you are outdoors.  . Once a month, do a whole body skin exam, using a mirror to look at the skin on your back. Tell your health care provider of new moles, moles that have irregular borders, moles that are larger than a pencil eraser, or moles that have changed in shape or color.

## 2016-12-05 NOTE — Progress Notes (Signed)
PCP notes:   Health maintenance:  Hep C screening - completed Flu vaccine - per pt, completed in Oct 2017  Abnormal screenings:   Hearing - failed  Patient concerns:   None  Nurse concerns:  None  Next PCP appt:   12/10/16 @ 1045

## 2016-12-05 NOTE — Progress Notes (Signed)
Pre visit review using our clinic review tool, if applicable. No additional management support is needed unless otherwise documented below in the visit note. 

## 2016-12-06 LAB — HEPATITIS C ANTIBODY: HCV Ab: NEGATIVE

## 2016-12-06 NOTE — Progress Notes (Signed)
   Subjective:    Patient ID: Shane Stewart, male    DOB: 10/30/1945, 71 y.o.   MRN: [id number]  HPI I reviewed health advisor's note, was available for consultation, and agree with documentation and plan.    Review of Systems     Objective:   Physical Exam        Assessment & Plan:

## 2016-12-10 ENCOUNTER — Ambulatory Visit (INDEPENDENT_AMBULATORY_CARE_PROVIDER_SITE_OTHER): Payer: PPO | Admitting: Family Medicine

## 2016-12-10 ENCOUNTER — Encounter: Payer: Self-pay | Admitting: Family Medicine

## 2016-12-10 VITALS — BP 110/60 | HR 62 | Temp 97.8°F | Wt 171.5 lb

## 2016-12-10 DIAGNOSIS — Z125 Encounter for screening for malignant neoplasm of prostate: Secondary | ICD-10-CM | POA: Diagnosis not present

## 2016-12-10 DIAGNOSIS — E7800 Pure hypercholesterolemia, unspecified: Secondary | ICD-10-CM

## 2016-12-10 DIAGNOSIS — L719 Rosacea, unspecified: Secondary | ICD-10-CM | POA: Diagnosis not present

## 2016-12-10 DIAGNOSIS — R7309 Other abnormal glucose: Secondary | ICD-10-CM

## 2016-12-10 DIAGNOSIS — R399 Unspecified symptoms and signs involving the genitourinary system: Secondary | ICD-10-CM | POA: Diagnosis not present

## 2016-12-10 DIAGNOSIS — L989 Disorder of the skin and subcutaneous tissue, unspecified: Secondary | ICD-10-CM

## 2016-12-10 DIAGNOSIS — Z0000 Encounter for general adult medical examination without abnormal findings: Secondary | ICD-10-CM | POA: Insufficient documentation

## 2016-12-10 LAB — PSA, MEDICARE: PSA: 11.06 ng/mL — AB (ref 0.10–4.00)

## 2016-12-10 NOTE — Assessment & Plan Note (Signed)
Controlled, continue as is.  

## 2016-12-10 NOTE — Progress Notes (Signed)
He has been working on diet and exercise.  Low carb diet and exercise led to weight loss.    Advance directive- would have his friend [name] designated if patient were incapacitated.   Hearing - failed prev.  Nonbothersome.  Declined hearing aids.   Colonoscopy 2015  Prostate cancer screening and PSA options (with potential risks and benefits of testing vs not testing) were discussed along with recent recs/guidelines.  He opted for testing PSA at this point. He has minimal LUTS that aren't bothersome enough to treat at this point, d/w pt.    Labs d/w pt.  Minimal inc in sugar.  Lipids are reasonable.    Rosacea controlled. No ADE on med.    Small papule noted on L of nose present for about 1 year, no change in size recently.  Also small puffy area R of R eyelids, noted in the last 4-6 weeks, not variable.    Meds, vitals, and allergies reviewed.   ROS: Per HPI unless specifically indicated in ROS section   GEN: nad, alert and oriented HEENT: mucous membranes moist NECK: supple w/o LA CV: rrr.  no murmur PULM: ctab, no inc wob ABD: soft, +bs EXT: no edema SKIN: no acute rash but 2 mm hemangioma noted on the L side of the face, just lateral to the nose.  He has mild/minimal puffiness on R side of face- locally, w/o fluctuant mass, just lateral and inferior to the lateral junction of the eyelids.  Not red, not ttp DRE deferred as it wouldn't change plan (to check PSA) at this point- d/w pt and he agreed

## 2016-12-10 NOTE — Assessment & Plan Note (Signed)
Check PSA given the LUTS, but the LUTS themselves are not bothersome enough to treat, d/w pt.  He agrees.  See notes on labs.

## 2016-12-10 NOTE — Assessment & Plan Note (Signed)
Reasonable control with diet and exercise, labs d/w pt.

## 2016-12-10 NOTE — Assessment & Plan Note (Signed)
Both lesions look benign- small hemangioma and slightly puffy skin that may be age related.  D/w pt.  Only needs observation.

## 2016-12-10 NOTE — Assessment & Plan Note (Signed)
Continue work on diet and exercise.  Labs d/w pt. >25 minutes spent in face to face time with patient, >50% spent in counselling or coordination of care.

## 2016-12-10 NOTE — Assessment & Plan Note (Signed)
Hearing - failed prev.  Nonbothersome.  Declined hearing aids.   Colonoscopy 2015 See above, see health maintenance.

## 2016-12-10 NOTE — Progress Notes (Signed)
Pre visit review using our clinic review tool, if applicable. No additional management support is needed unless otherwise documented below in the visit note. 

## 2016-12-10 NOTE — Patient Instructions (Addendum)
Go to the lab on the way out.  We'll contact you with your lab report. Ask if they can add on the PSA.  Keep exercising.  Take care.  Glad to see you.  Recheck yearly, sooner if needed.   Update me as needed.

## 2017-01-07 ENCOUNTER — Encounter: Payer: Self-pay | Admitting: Family Medicine

## 2017-01-07 ENCOUNTER — Ambulatory Visit (INDEPENDENT_AMBULATORY_CARE_PROVIDER_SITE_OTHER): Payer: PPO | Admitting: Family Medicine

## 2017-01-07 VITALS — BP 122/66 | HR 71 | Temp 97.7°F | Wt 171.2 lb

## 2017-01-07 DIAGNOSIS — R972 Elevated prostate specific antigen [PSA]: Secondary | ICD-10-CM | POA: Diagnosis not present

## 2017-01-07 DIAGNOSIS — Z125 Encounter for screening for malignant neoplasm of prostate: Secondary | ICD-10-CM

## 2017-01-07 DIAGNOSIS — C61 Malignant neoplasm of prostate: Secondary | ICD-10-CM | POA: Insufficient documentation

## 2017-01-07 LAB — POC URINALSYSI DIPSTICK (AUTOMATED)
BILIRUBIN UA: NEGATIVE
Glucose, UA: NEGATIVE
KETONES UA: NEGATIVE
Leukocytes, UA: NEGATIVE
Nitrite, UA: NEGATIVE
Protein, UA: NEGATIVE
RBC UA: NEGATIVE
Urobilinogen, UA: 0.2 E.U./dL
pH, UA: 6 (ref 5.0–8.0)

## 2017-01-07 LAB — PSA, MEDICARE: PSA: 6.3 ng/mL — AB (ref 0.10–4.00)

## 2017-01-07 NOTE — Progress Notes (Signed)
Prev with elevated PSA.  Here for recheck.  No urinary sx.  No dysuria now or prev.  No blood seen in urine.  No pelvic pain.  No FH prostate cancer.  0-1 nocturia at night.  No frequency during the day except for as expected due high fluid intake.   Meds, vitals, and allergies reviewed.   ROS: Per HPI unless specifically indicated in ROS section   nad ncat Prostate gland firm and smooth, no enlargement, nodularity, tenderness, mass, asymmetry or induration.

## 2017-01-07 NOTE — Progress Notes (Signed)
Pre visit review using our clinic review tool, if applicable. No additional management support is needed unless otherwise documented below in the visit note. 

## 2017-01-07 NOTE — Patient Instructions (Signed)
Go to the lab on the way out.  We'll contact you with your lab report. We'll go from there.  Take care.  Glad to see you.  

## 2017-01-07 NOTE — Assessment & Plan Note (Signed)
See notes on labs.   u/a and PSA pending.  Normal DRE.  D/w pt about possible false pos with PSA, with plan to refer to uro if not resolved. He agrees.

## 2017-01-08 ENCOUNTER — Other Ambulatory Visit: Payer: Self-pay | Admitting: Family Medicine

## 2017-01-08 DIAGNOSIS — R972 Elevated prostate specific antigen [PSA]: Secondary | ICD-10-CM

## 2017-02-20 ENCOUNTER — Ambulatory Visit (INDEPENDENT_AMBULATORY_CARE_PROVIDER_SITE_OTHER): Payer: PPO | Admitting: Urology

## 2017-02-20 ENCOUNTER — Encounter: Payer: Self-pay | Admitting: Urology

## 2017-02-20 VITALS — BP 135/68 | HR 61 | Ht 68.0 in | Wt 163.0 lb

## 2017-02-20 DIAGNOSIS — R972 Elevated prostate specific antigen [PSA]: Secondary | ICD-10-CM | POA: Diagnosis not present

## 2017-02-20 DIAGNOSIS — M7550 Bursitis of unspecified shoulder: Secondary | ICD-10-CM

## 2017-02-20 DIAGNOSIS — M719 Bursopathy, unspecified: Secondary | ICD-10-CM | POA: Insufficient documentation

## 2017-02-20 DIAGNOSIS — N400 Enlarged prostate without lower urinary tract symptoms: Secondary | ICD-10-CM | POA: Diagnosis not present

## 2017-02-20 DIAGNOSIS — M7540 Impingement syndrome of unspecified shoulder: Secondary | ICD-10-CM | POA: Insufficient documentation

## 2017-02-20 NOTE — Progress Notes (Signed)
02/20/2017 9:52 AM   Shane Stewart Jan 19, 1946 [id number]  Referring provider: Tonia Ghent, MD [address] [place], [place] [address]  Chief Complaint  Patient presents with  . Elevated PSA    New Patient    HPI: The patient is a 71 year old gentleman presents today to discuss an elevated PSA as well as urinary complaints.  1. Elevated PSA Patient has an elevated PSA that was originally checked due to his urinary complaints. Urinalysis at that time was unremarkable.  He has no family history of prostate cancer.  PSA History: 6.21 January 2017 11.27 November 2016 1.57    Feb 2012  2. BPH Patient's only complaint at this time is a weak stream. He has nocturia 2 and occasional urgency which she is not bothered by. I PSS score is 10/2. He is mostly satisfied with his symptoms and is not interested in medication for his weak stream.  PMH: Past Medical History:  Diagnosis Date  . Diverticulosis of colon 01/1999  . History of CT scan 04/1985   L/S, H/P L5/S1  . Hyperlipidemia   . Right knee pain    better with hyaluronic acid  . Rosacea     Surgical History: Past Surgical History:  Procedure Laterality Date  . LUMBAR SPINE SURGERY  1986   LS ruptured dis repair  . UMBILICAL HERNIA REPAIR  10/05/2005    Home Medications:  Allergies as of 02/20/2017   No Known Allergies     Medication List       Accurate as of 02/20/17  9:52 AM. Always use your most recent med list.          cholecalciferol 1000 units tablet Commonly known as:  VITAMIN D Take 2,000 Units by mouth daily. Reported on 12/08/2015   Hyaluronic Acid 400-80-40 MG Caps as needed. Take one by mouth 3-4 times a week   metroNIDAZOLE 0.75 % gel Commonly known as:  METROGEL Apply topically daily. Apply to affected area daily as needed   multivitamin tablet Take 1 tablet by mouth daily.   triamcinolone cream 0.5 % Commonly known as:  KENALOG Apply 1 application topically 2 (two) times  daily as needed.   vitamin B-12 1000 MCG tablet Commonly known as:  CYANOCOBALAMIN Take 1,000 mcg by mouth daily.       Allergies: No Known Allergies  Family History: Family History  Problem Relation Age of Onset  . Alzheimer's disease Mother   . Dementia Mother   . Cancer Father        colon  . Heart disease Father        CAD  . Diabetes Father   . Colon cancer Father 81  . Diabetes Sister   . Heart disease Sister        MI CABG x 3  . Hypertension Sister   . Prostate cancer Neg Hx     Social History:  reports that he has never smoked. He has never used smokeless tobacco. He reports that he does not drink alcohol or use drugs.  ROS: UROLOGY Frequent Urination?: Yes Hard to postpone urination?: Yes Burning/pain with urination?: No Get up at night to urinate?: Yes Leakage of urine?: No Urine stream starts and stops?: No Trouble starting stream?: No Do you have to strain to urinate?: No Blood in urine?: No Urinary tract infection?: No Sexually transmitted disease?: No Injury to kidneys or bladder?: No Painful intercourse?: No Weak stream?: No Erection problems?: No  Penile pain?: No  Gastrointestinal Nausea?: No Vomiting?: No Indigestion/heartburn?: No Diarrhea?: No Constipation?: No  Constitutional Fever: No Night sweats?: No Weight loss?: No Fatigue?: No  Skin Skin rash/lesions?: No Itching?: No  Eyes Blurred vision?: No Double vision?: No  Ears/Nose/Throat Sore throat?: No Sinus problems?: No  Hematologic/Lymphatic Swollen glands?: No Easy bruising?: No  Cardiovascular Leg swelling?: No Chest pain?: No  Respiratory Cough?: No Shortness of breath?: No  Endocrine Excessive thirst?: No  Musculoskeletal Back pain?: No Joint pain?: No  Neurological Headaches?: No Dizziness?: No  Psychologic Depression?: No Anxiety?: No  Physical Exam: BP 135/68   Pulse 61   Ht 5\' 8"  (1.727 m)   Wt 163 lb (73.9 kg)   BMI 24.78 kg/m    Constitutional:  Alert and oriented, No acute distress. HEENT: [place] AT, moist mucus membranes.  Trachea midline, no masses. Cardiovascular: No clubbing, cyanosis, or edema. Respiratory: Normal respiratory effort, no increased work of breathing. GI: Abdomen is soft, nontender, nondistended, no abdominal masses GU: No CVA tenderness. Normal phallus. Testicles descended bilaterally. Benign. DRE: 2+. Proximal and 1 cm nodule in the right apex. Skin: No rashes, bruises or suspicious lesions. Lymph: No cervical or inguinal adenopathy. Neurologic: Grossly intact, no focal deficits, moving all 4 extremities. Psychiatric: Normal mood and affect.  Laboratory Data: Lab Results  Component Value Date   WBC 5.1 11/02/2010   HGB 15.9 11/02/2010   HCT 45.4 11/02/2010   MCV 96.3 11/02/2010   PLT 156.0 11/02/2010    Lab Results  Component Value Date   CREATININE 0.91 12/05/2016    Lab Results  Component Value Date   PSA 6.30 (H) 01/07/2017   PSA 11.06 (H) 12/10/2016   PSA 1.57 11/02/2010    No results found for: TESTOSTERONE  No results found for: HGBA1C  Urinalysis    Component Value Date/Time   BILIRUBINUR Neg 01/07/2017 1033   PROTEINUR Neg 01/07/2017 1033   UROBILINOGEN 0.2 01/07/2017 1033   NITRITE Neg 01/07/2017 1033   LEUKOCYTESUR Negative 01/07/2017 1033     Assessment & Plan:  1. Elevated PSA with abnormal DRE I discussed with the patient that given his elevated PSA as well as the nodule in his right apex of his prostate that he is at risk for clinically significant prostate cancer. We discussed the next step would be a prostate biopsy. We discussed the risks, benefits, and indications of this procedure. He understands the risks include but are not limited to bleeding and infection. He understands only blood in his stool and urine for up to 48 hours in his semen for up to 6 weeks. He understands the risk of sepsis is approximately 1% which required IV antibiotics and  hospitalization. All questions were answered. The patient has elected to proceed.  2. BPH Patient not bothered by symptoms. No treatment necessary.  Return for prostate biopsy.  Nickie Retort, MD  [facility] [address] [place], [place] [address] [phone]

## 2017-03-12 ENCOUNTER — Encounter: Payer: Self-pay | Admitting: Family Medicine

## 2017-03-14 ENCOUNTER — Other Ambulatory Visit: Payer: Self-pay | Admitting: Family Medicine

## 2017-03-14 DIAGNOSIS — L719 Rosacea, unspecified: Secondary | ICD-10-CM

## 2017-03-14 MED ORDER — METRONIDAZOLE 0.75 % EX GEL: Freq: Every day | CUTANEOUS | 3 refills | Status: DC

## 2017-03-20 ENCOUNTER — Other Ambulatory Visit: Payer: Self-pay | Admitting: Urology

## 2017-03-20 ENCOUNTER — Encounter: Payer: Self-pay | Admitting: Urology

## 2017-03-20 ENCOUNTER — Ambulatory Visit: Payer: PPO | Admitting: Urology

## 2017-03-20 VITALS — BP 121/60 | HR 68 | Ht 68.0 in | Wt 161.0 lb

## 2017-03-20 DIAGNOSIS — C61 Malignant neoplasm of prostate: Secondary | ICD-10-CM | POA: Diagnosis not present

## 2017-03-20 DIAGNOSIS — N4232 Atypical small acinar proliferation of prostate: Secondary | ICD-10-CM | POA: Diagnosis not present

## 2017-03-20 DIAGNOSIS — R972 Elevated prostate specific antigen [PSA]: Secondary | ICD-10-CM | POA: Diagnosis not present

## 2017-03-20 MED ORDER — GENTAMICIN SULFATE 40 MG/ML IJ SOLN
80.0000 mg | Freq: Once | INTRAMUSCULAR | Status: AC
  Administered 2017-03-20: 80 mg via INTRAMUSCULAR

## 2017-03-20 MED ORDER — LEVOFLOXACIN 500 MG PO TABS
500.0000 mg | ORAL_TABLET | Freq: Once | ORAL | Status: AC
  Administered 2017-03-20: 500 mg via ORAL

## 2017-03-20 NOTE — Progress Notes (Signed)
Prostate Biopsy Procedure   Informed consent was obtained after discussing risks/benefits of the procedure.  A time out was performed to ensure correct patient identity.  Pre-Procedure: - Last PSA Level:  Lab Results  Component Value Date   PSA 6.30 (H) 01/07/2017   PSA 11.06 (H) 12/10/2016   PSA 1.57 11/02/2010   - Gentamicin given prophylactically - Levaquin 500 mg administered PO -Transrectal Ultrasound performed revealing a 30.32 gm prostate -No significant hypoechoic. Small median lobe noted.  Procedure: - Prostate block performed using 10 cc 1% lidocaine and biopsies taken from sextant areas, a total of 12 under ultrasound guidance.  Post-Procedure: - Patient tolerated the procedure well - He was counseled to seek immediate medical attention if experiences any severe pain, significant bleeding, or fevers - Return in one week to discuss biopsy results

## 2017-03-25 ENCOUNTER — Other Ambulatory Visit: Payer: Self-pay | Admitting: Urology

## 2017-03-25 LAB — PATHOLOGY REPORT

## 2017-04-03 ENCOUNTER — Ambulatory Visit (INDEPENDENT_AMBULATORY_CARE_PROVIDER_SITE_OTHER): Payer: PPO | Admitting: Urology

## 2017-04-03 ENCOUNTER — Encounter: Payer: Self-pay | Admitting: Urology

## 2017-04-03 VITALS — BP 118/62 | HR 76 | Ht 68.0 in | Wt 168.2 lb

## 2017-04-03 DIAGNOSIS — C61 Malignant neoplasm of prostate: Secondary | ICD-10-CM

## 2017-04-03 NOTE — Progress Notes (Signed)
04/03/2017 4:27 PM   Shane Stewart 21-Oct-1945 [id number]  Referring provider: Tonia Ghent, MD [place], [place] [address]  Chief Complaint  Patient presents with  . Follow-up    Biopsy results    HPI: The patient is a 71 year old gentleman presents today to discuss his prostate biopsy results which was performed for a PSA of 6.3 and a 1 cm nodule at the right apex. His biopsy showed Gleason 4+4 = 8 disease in 4 of 12 cores.  Pathology: Gleason 4+4 = 8 in 4% of the left lateral apex Gleason 4+3 = 7 in 40% and 27% of the left lateral base and left medial mid Gleason 3+3 = 6 prostate cancer in 7% of the left medial apex.    The patient does note strong erections that are good for the completion of intercourse. He does not need medication for this.  PMH: Past Medical History:  Diagnosis Date  . Diverticulosis of colon 01/1999  . History of CT scan 04/1985   L/S, H/P L5/S1  . Hyperlipidemia   . Right knee pain    better with hyaluronic acid  . Rosacea     Surgical History: Past Surgical History:  Procedure Laterality Date  . LUMBAR SPINE SURGERY  1986   LS ruptured dis repair  . UMBILICAL HERNIA REPAIR  10/05/2005    Home Medications:  Allergies as of 04/03/2017   No Known Allergies     Medication List       Accurate as of 04/03/17  4:27 PM. Always use your most recent med list.          cholecalciferol 1000 units tablet Commonly known as:  VITAMIN D Take 2,000 Units by mouth daily. Reported on 12/08/2015   Hyaluronic Acid 400-80-40 MG Caps as needed. Take one by mouth 3-4 times a week   metroNIDAZOLE 0.75 % gel Commonly known as:  METROGEL Apply topically daily. Apply to affected area daily as needed   multivitamin tablet Take 1 tablet by mouth daily.   triamcinolone cream 0.5 % Commonly known as:  KENALOG Apply 1 application topically 2 (two) times daily as needed.   vitamin B-12 1000 MCG tablet Commonly known as:   CYANOCOBALAMIN Take 1,000 mcg by mouth daily.       Allergies: No Known Allergies  Family History: Family History  Problem Relation Age of Onset  . Alzheimer's disease Mother   . Dementia Mother   . Cancer Father        colon  . Heart disease Father        CAD  . Diabetes Father   . Colon cancer Father 13  . Diabetes Sister   . Heart disease Sister        MI CABG x 3  . Hypertension Sister   . Prostate cancer Neg Hx     Social History:  reports that he has never smoked. He has never used smokeless tobacco. He reports that he does not drink alcohol or use drugs.  ROS:                                        Physical Exam: There were no vitals taken for this visit.  Constitutional:  Alert and oriented, No acute distress. HEENT: [place] AT, moist mucus membranes.  Trachea midline, no masses. Cardiovascular: No clubbing, cyanosis, or edema. Respiratory: Normal  respiratory effort, no increased work of breathing. GI: Abdomen is soft, nontender, nondistended, no abdominal masses GU: No CVA tenderness.  Skin: No rashes, bruises or suspicious lesions. Lymph: No cervical or inguinal adenopathy. Neurologic: Grossly intact, no focal deficits, moving all 4 extremities. Psychiatric: Normal mood and affect.  Laboratory Data: Lab Results  Component Value Date   WBC 5.1 11/02/2010   HGB 15.9 11/02/2010   HCT 45.4 11/02/2010   MCV 96.3 11/02/2010   PLT 156.0 11/02/2010    Lab Results  Component Value Date   CREATININE 0.91 12/05/2016    Lab Results  Component Value Date   PSA 6.30 (H) 01/07/2017   PSA 11.06 (H) 12/10/2016   PSA 1.57 11/02/2010    No results found for: TESTOSTERONE  No results found for: HGBA1C  Urinalysis    Component Value Date/Time   BILIRUBINUR Neg 01/07/2017 1033   PROTEINUR Neg 01/07/2017 1033   UROBILINOGEN 0.2 01/07/2017 1033   NITRITE Neg 01/07/2017 1033   LEUKOCYTESUR Negative 01/07/2017 1033    Assessment &  Plan:    1. High risk prostate cancer I went over the patient's pathology report with him in great detail and discussed his prostate cancer. He does understand that he has a low volume of what is considered high risk disease. We did discuss the next step would be to confirm no metastatic disease via CT scan and bone scan. We did discuss treatment options if these images are normal. We discussed in great detail the robotic prostatectomy as well as radiation therapy options. We discussed major side effects of both which include incontinence and erectile dysfunction. We also discussed the recovery time after undergoing a prostatectomy what to expect. For now, I have given him the 100 questions of prostate cancer booklet. We will have him undergo a metastatic workup and follow-up at that time. If this is negative, we will also plan for consultation with radiation oncology.  Return for after CT and bone scane.  Nickie Retort, MD  [facility] [address] [place], [place] [address] [phone]

## 2017-04-11 ENCOUNTER — Ambulatory Visit: Payer: PPO

## 2017-04-11 ENCOUNTER — Encounter: Payer: Self-pay | Admitting: Family Medicine

## 2017-04-12 ENCOUNTER — Ambulatory Visit (INDEPENDENT_AMBULATORY_CARE_PROVIDER_SITE_OTHER): Payer: PPO | Admitting: Family Medicine

## 2017-04-12 ENCOUNTER — Encounter: Payer: Self-pay | Admitting: Family Medicine

## 2017-04-12 VITALS — BP 116/66 | HR 72 | Temp 97.7°F | Wt 167.8 lb

## 2017-04-12 DIAGNOSIS — S70362A Insect bite (nonvenomous), left thigh, initial encounter: Secondary | ICD-10-CM | POA: Diagnosis not present

## 2017-04-12 DIAGNOSIS — R21 Rash and other nonspecific skin eruption: Secondary | ICD-10-CM

## 2017-04-12 DIAGNOSIS — C61 Malignant neoplasm of prostate: Secondary | ICD-10-CM

## 2017-04-12 DIAGNOSIS — K1370 Unspecified lesions of oral mucosa: Secondary | ICD-10-CM

## 2017-04-12 DIAGNOSIS — W57XXXA Bitten or stung by nonvenomous insect and other nonvenomous arthropods, initial encounter: Secondary | ICD-10-CM | POA: Diagnosis not present

## 2017-04-12 NOTE — Progress Notes (Signed)
About 1 month ago- tick bite in the L medial thigh.  No FCNAVD.  No rash.  Persistent local irritation.  No drainage.  At least partially removed, unclear if fully removed.   Dx of prostate cancer, d/w pt.  He has staging pending. He and I both appreciate the help of all involved.  Oral lesion noted a few weeks ago.  Unclear if getting better. Not tender but he can feel it with his tongue.    He has a rash on his torso, a horizontal band crosses the midline that corresponds to the heart rate monitor he uses when he is at the gym.  Meds, vitals, and allergies reviewed.   ROS: Per HPI unless specifically indicated in ROS section   nad ncat MMM OP wnl except for lesion on the hard palate, just to L of midline on the hard palate- appears to be locally irritated.  No ulceration.  Neck supple. No LA rrr ctab L thigh with 1cm area of fibrotic scar tissue.   Blanching rash noted on the chest- as described above - likely from heart rate monitor.

## 2017-04-12 NOTE — Patient Instructions (Signed)
If the leg spot changes then let me know.  Hydrocortisone on the chest rash.  Rosaria Ferries will call about your referral to the oral surgeon.  Take care.  Glad to see you.

## 2017-04-14 DIAGNOSIS — K1370 Unspecified lesions of oral mucosa: Secondary | ICD-10-CM | POA: Insufficient documentation

## 2017-04-14 DIAGNOSIS — W57XXXA Bitten or stung by nonvenomous insect and other nonvenomous arthropods, initial encounter: Secondary | ICD-10-CM | POA: Insufficient documentation

## 2017-04-14 NOTE — Assessment & Plan Note (Signed)
With urology workup and staging pending. I appreciate the help of all involved.

## 2017-04-14 NOTE — Assessment & Plan Note (Signed)
It looks like he has an exaggerated local reaction with some fibrotic scar tissue noted. I transilluminated the lesion but could not appreciate any foreign body. Discussed with patient about options, punch vs I&D vs ellipitical excision. Elliptical excision would likely be exceedingly aggressive. I&D would likely not yield any significant benefit for the patient given that there is no apparent infection. Punch biopsy would likely be of low yield given that it would be going through scar tissue and we may not have any foreign body retained that could be removed. It may also complicate subsequent healing. Discussed with patient. He agrees. Observe for now and update me if needed. He agrees.

## 2017-04-14 NOTE — Assessment & Plan Note (Signed)
Refer to oral surgery or ENT. I am okay with either and would greatly appreciate the help from either.

## 2017-04-14 NOTE — Assessment & Plan Note (Signed)
Okay to use topical steroid. Should resolve. Likely is a contact dermatitis from the device. Discussed avoidance. Does not appear infected.

## 2017-04-15 ENCOUNTER — Encounter: Payer: Self-pay | Admitting: Family Medicine

## 2017-04-16 ENCOUNTER — Encounter
Admission: RE | Admit: 2017-04-16 | Discharge: 2017-04-16 | Disposition: A | Discharge status: Home / Self Care | Payer: PPO | Source: Ambulatory Visit | Attending: Urology | Admitting: Urology

## 2017-04-16 ENCOUNTER — Ambulatory Visit
Admission: RE | Admit: 2017-04-16 | Discharge: 2017-04-16 | Disposition: A | Discharge status: Home / Self Care | Payer: PPO | Source: Ambulatory Visit | Attending: Urology | Admitting: Urology

## 2017-04-16 DIAGNOSIS — Z8546 Personal history of malignant neoplasm of prostate: Secondary | ICD-10-CM | POA: Diagnosis not present

## 2017-04-16 DIAGNOSIS — C61 Malignant neoplasm of prostate: Secondary | ICD-10-CM | POA: Diagnosis present

## 2017-04-16 IMAGING — NM NM BONE WHOLE BODY
2 series · 6 of 6 positions shown · non-contrast
Comparison: None in PACs

CLINICAL DATA: History of prostate malignancy. No current symptoms.

EXAM:
NUCLEAR MEDICINE WHOLE BODY BONE SCAN
TECHNIQUE: Whole body anterior and posterior images were obtained approximately
3 hours after intravenous injection of radiopharmaceutical.
RADIOPHARMACEUTICALS:  20.739 mCi Nechnetium-55m MDP IV

[Series 1000: statics · 2.40mm/px · 2 acquisitions, 4 frames shown]
[im 1/2]
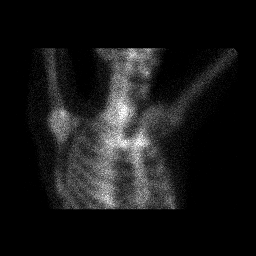
[im 1/2]
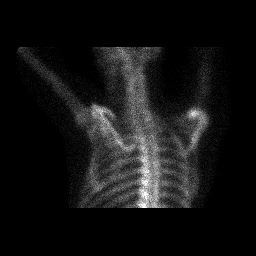
[im 2/2]
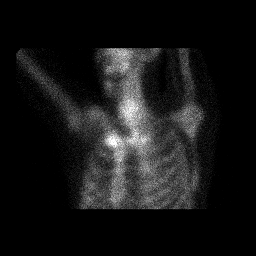
[im 2/2]
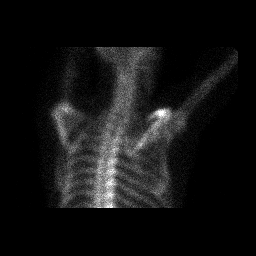

[Series 1000: 3 hr wholebody · 2.40mm/px · 2 of 2 frames shown]
[frame 1/2]
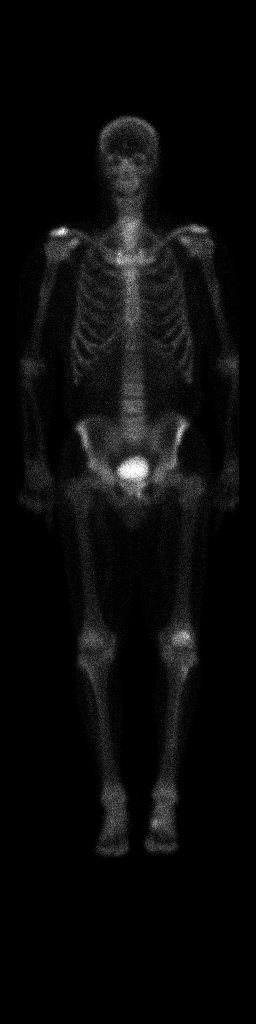
[frame 2/2]
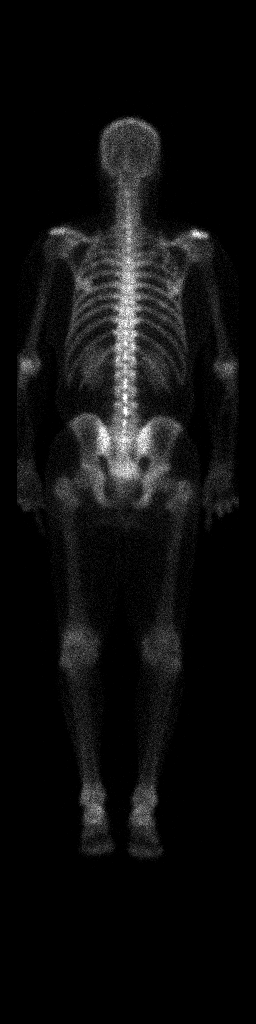

[6 of 6 positions shown; findings below may reference images not displayed]

FINDINGS: There is adequate uptake of the radiopharmaceutical by the skeleton.
Adequate soft tissue clearance and renal activity is observed.
Activity within the calvarium, spine, ribs, and pelvis is normal.
Activity within the pectoral girdle is within the limits of normal.
There is increased uptake associated with the left patella and in
the medial aspect of the left foot that is likely degenerative or
post traumatic.
IMPRESSION: There are no findings suspicious for metastatic prostate malignancy.

## 2017-04-16 MED ORDER — TECHNETIUM TC 99M MEDRONATE IV KIT
20.7390 | PACK | Freq: Once | INTRAVENOUS | Status: AC | PRN
  Administered 2017-04-16: 20.739 via INTRAVENOUS

## 2017-04-17 ENCOUNTER — Ambulatory Visit
Admission: RE | Admit: 2017-04-17 | Discharge: 2017-04-17 | Disposition: A | Discharge status: Home / Self Care | Payer: PPO | Source: Ambulatory Visit | Attending: Urology | Admitting: Urology

## 2017-04-17 DIAGNOSIS — I700 Atherosclerosis of aorta: Secondary | ICD-10-CM | POA: Insufficient documentation

## 2017-04-17 DIAGNOSIS — C61 Malignant neoplasm of prostate: Secondary | ICD-10-CM

## 2017-04-17 HISTORY — DX: Malignant (primary) neoplasm, unspecified: C80.1

## 2017-04-17 LAB — POCT I-STAT CREATININE: Creatinine, Ser: 0.8 mg/dL (ref 0.61–1.24)

## 2017-04-17 IMAGING — CT CT ABD-PELV W/ CM
1 of 3 series · 14 of 32 positions shown, 19 images · IV contrast (APPLIED)
Comparison: None.

CLINICAL DATA: Newly diagnosed prostate carcinoma.  Staging.

EXAM:
CT ABDOMEN AND PELVIS WITH CONTRAST
TECHNIQUE: Multidetector CT imaging of the abdomen and pelvis was performed
using the standard protocol following bolus administration of
intravenous contrast.
CONTRAST:  100mL FSSGZ1-5UU IOPAMIDOL (FSSGZ1-5UU) INJECTION 61%

[Series 2: axial st · axial · 0.71mm/px · z∈[-1061,-611]mm · 14 of 101 slices shown, 19 images]
[im 6/101  soft-tissue]
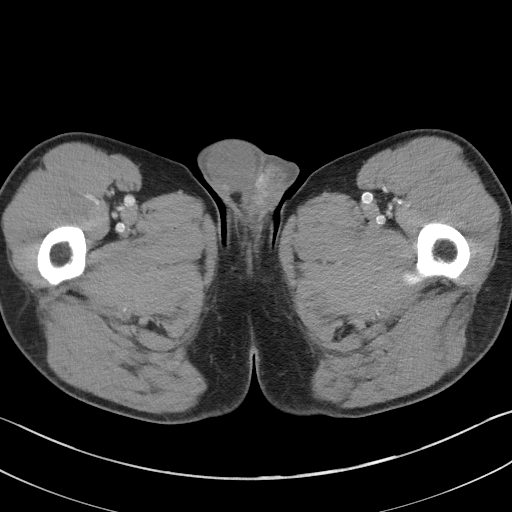
[im 6/101  bone]
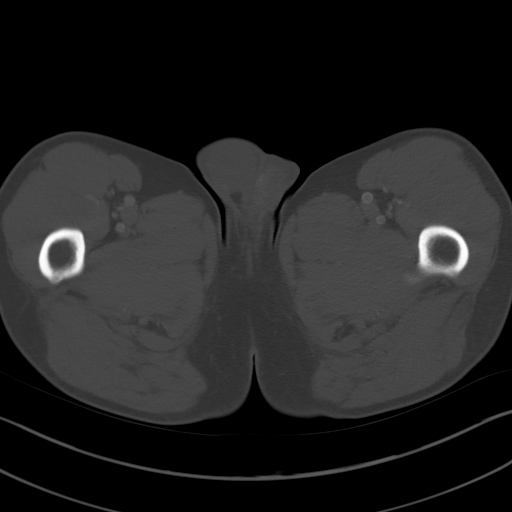
[im 16/101  soft-tissue]
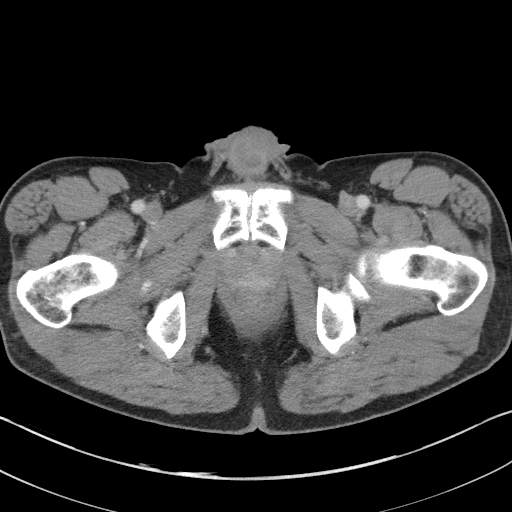
[im 21/101  soft-tissue]
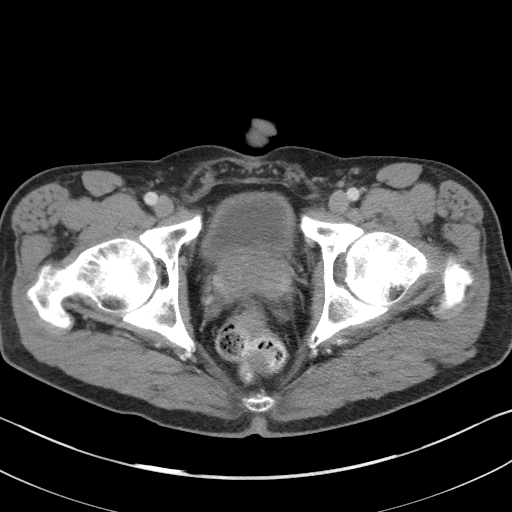
[im 31/101  soft-tissue]
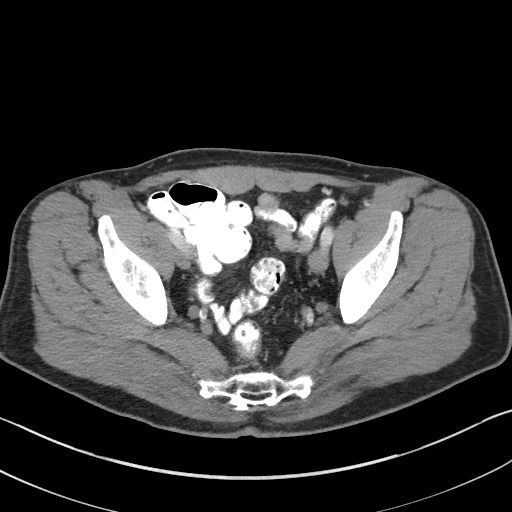
[im 36/101  soft-tissue]
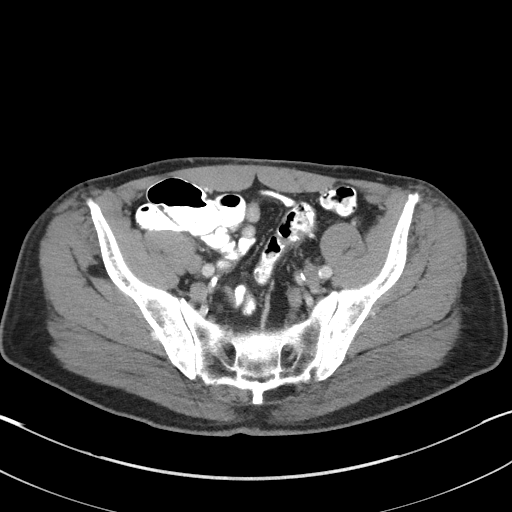
[im 46/101  soft-tissue]
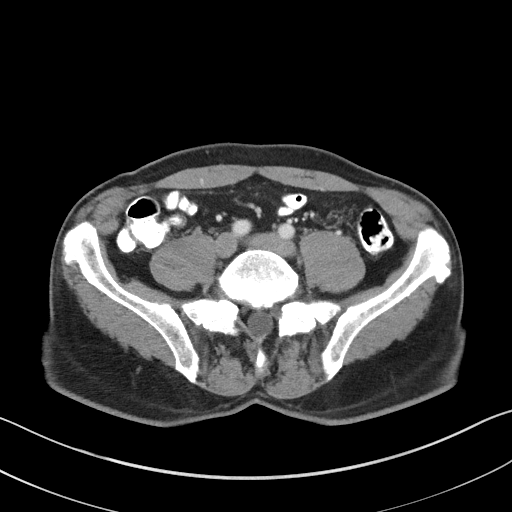
[im 51/101  soft-tissue]
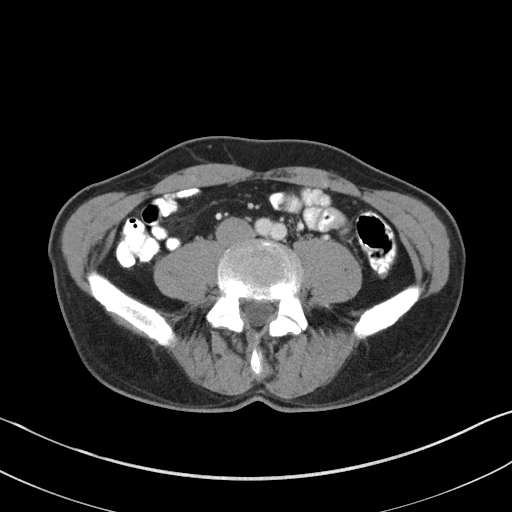
[im 56/101  soft-tissue]
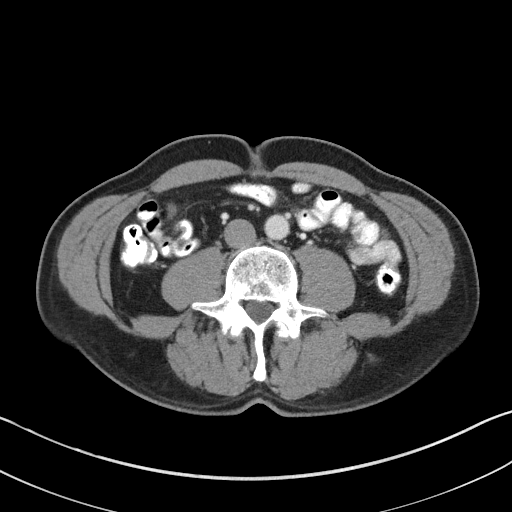
[im 66/101  soft-tissue]
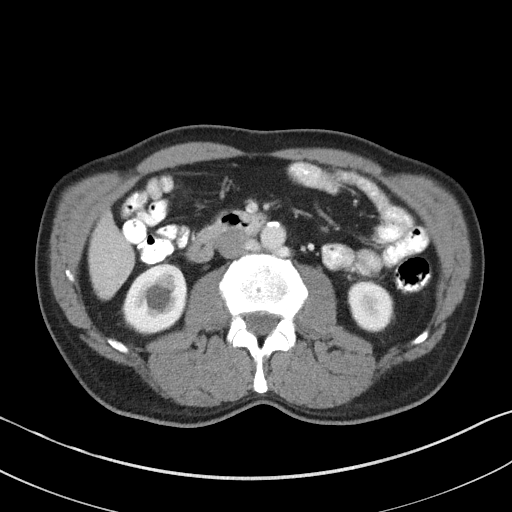
[im 66/101  bone]
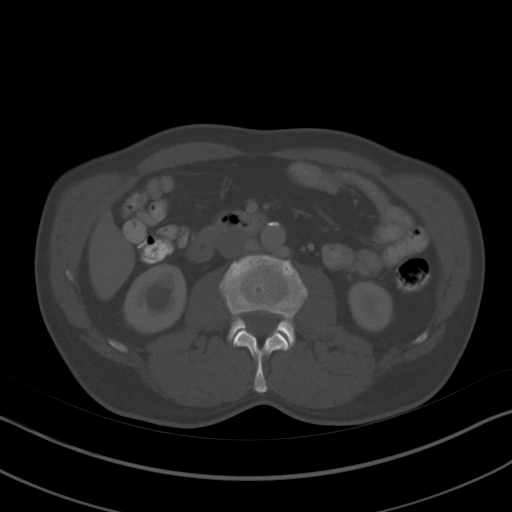
[im 71/101  soft-tissue]
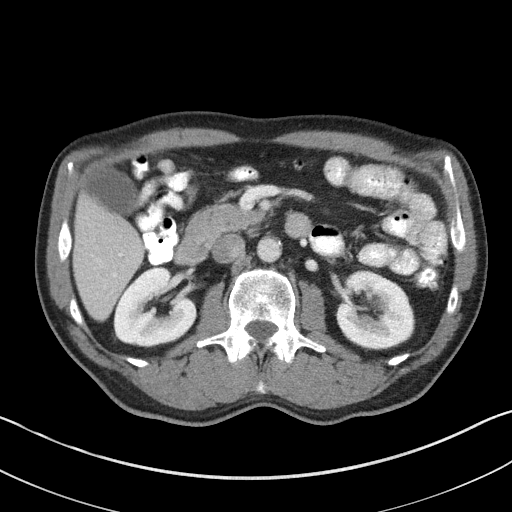
[im 81/101  soft-tissue]
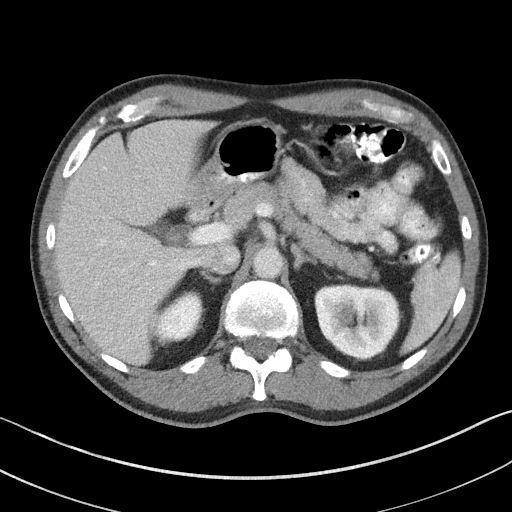
[im 81/101  lung]
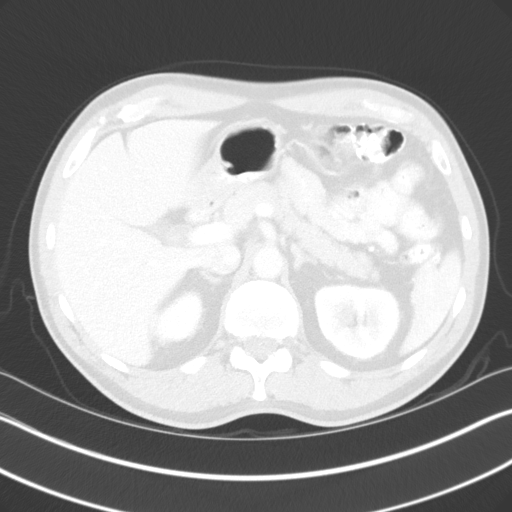
[im 86/101  soft-tissue]
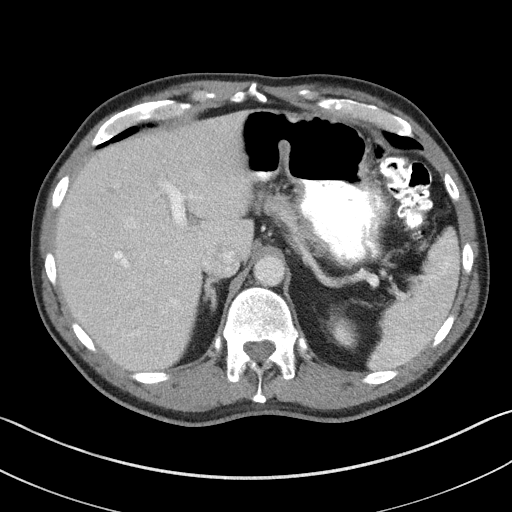
[im 86/101  lung]
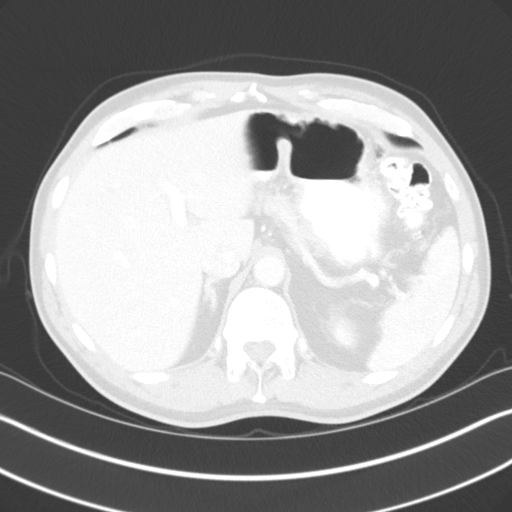
[im 91/101  lung]
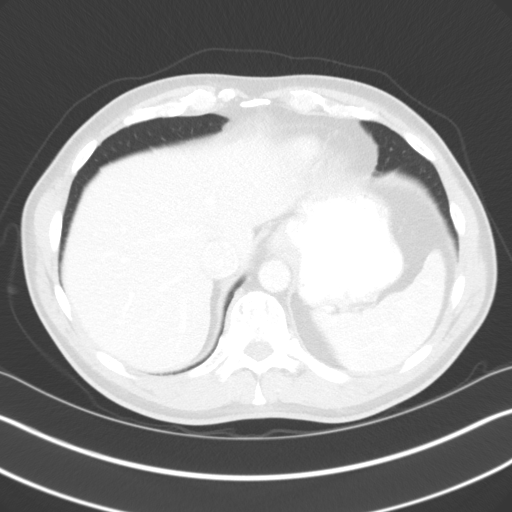
[im 96/101  soft-tissue]
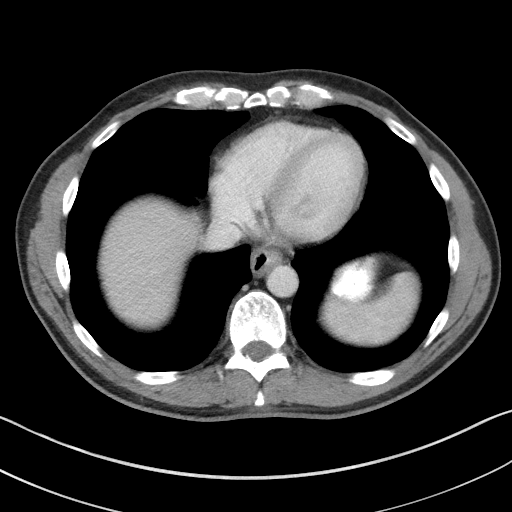
[im 96/101  lung]
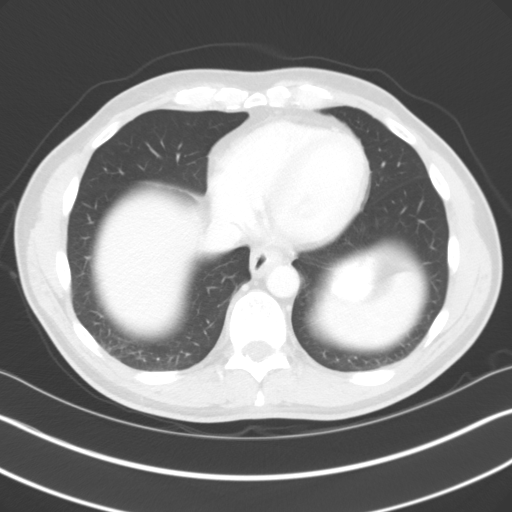

[14 of 32 positions shown; findings below may reference images not displayed]

FINDINGS: Lower Chest: No acute findings.

Hepatobiliary: No hepatic masses identified. A few tiny sub-cm
hepatic cysts noted. Gallbladder is unremarkable.

Pancreas:  No mass or inflammatory changes.

Spleen: Within normal limits in size and appearance.

Adrenals/Urinary Tract: No masses identified. Small bilateral renal
cysts. No evidence of hydronephrosis. Unremarkable unopacified
urinary bladder.

Stomach/Bowel: No evidence of obstruction, inflammatory process or
abnormal fluid collections.

Vascular/Lymphatic: No pathologically enlarged lymph nodes. No
abdominal aortic aneurysm. Aortic atherosclerosis.

Reproductive: Unremarkable appearance of prostate and seminal
vesicles.

Other:  None.

Musculoskeletal:  No suspicious bone lesions identified.
IMPRESSION: No evidence of metastatic disease within the abdomen or pelvis.

Aortic atherosclerosis.

## 2017-04-17 MED ORDER — IOPAMIDOL (ISOVUE-300) INJECTION 61%
100.0000 mL | Freq: Once | INTRAVENOUS | Status: AC | PRN
  Administered 2017-04-17: 100 mL via INTRAVENOUS

## 2017-04-18 ENCOUNTER — Ambulatory Visit: Payer: PPO | Admitting: Urology

## 2017-04-18 ENCOUNTER — Encounter: Payer: Self-pay | Admitting: Urology

## 2017-04-18 VITALS — BP 124/63 | HR 87 | Ht 68.0 in | Wt 168.7 lb

## 2017-04-18 DIAGNOSIS — C61 Malignant neoplasm of prostate: Secondary | ICD-10-CM

## 2017-04-18 NOTE — Progress Notes (Signed)
04/18/2017 9:41 AM   Shane Stewart [birth year] [id number]  Referring provider: Tonia Ghent, MD [address] [place], [place] [address]  Chief Complaint  Patient presents with  . Prostate Cancer    HPI: The patient is a 71 year old gentleman presents today to discuss his metastatic work up results for recently diagnosed prostate cancer. Prostate biopsy was performed for a PSA of 6.3 and a 1 cm nodule at the right apex. His biopsy showed Gleason 4+4 = 8 disease in 4 of 12 cores.  Pathology: (Right apex w/ nodule on DRE) Gleason 4+4 = 8 in 4% of the left lateral apex Gleason 4+3 = 7 in 40% and 27% of the left lateral base and left medial mid Gleason 3+3 = 6 prostate cancer in 7% of the left medial apex.    The patient does note strong erections that are good for the completion of intercourse. He does not need medication for this.     PMH: Past Medical History:  Diagnosis Date  . Cancer ([place])   . Diverticulosis of colon 01/1999  . History of CT scan 04/1985   L/S, H/P L5/S1  . Hyperlipidemia   . Right knee pain    better with hyaluronic acid  . Rosacea     Surgical History: Past Surgical History:  Procedure Laterality Date  . LUMBAR SPINE SURGERY  1986   LS ruptured dis repair  . UMBILICAL HERNIA REPAIR  10/05/2005    Home Medications:  Allergies as of 04/18/2017   No Known Allergies     Medication List       Accurate as of 04/18/17  9:41 AM. Always use your most recent med list.          cholecalciferol 1000 units tablet Commonly known as:  VITAMIN D Take 2,000 Units by mouth daily. Reported on 12/08/2015   Hyaluronic Acid 400-80-40 MG Caps as needed. Take one by mouth 3-4 times a week   metroNIDAZOLE 0.75 % gel Commonly known as:  METROGEL Apply topically daily. Apply to affected area daily as needed   multivitamin tablet Take 1 tablet by mouth daily.   triamcinolone cream 0.5 % Commonly known as:  KENALOG Apply 1 application  topically 2 (two) times daily as needed.   vitamin B-12 1000 MCG tablet Commonly known as:  CYANOCOBALAMIN Take 1,000 mcg by mouth daily.       Allergies: No Known Allergies  Family History: Family History  Problem Relation Age of Onset  . Alzheimer's disease Mother   . Dementia Mother   . Cancer Father        colon  . Heart disease Father        CAD  . Diabetes Father   . Colon cancer Father 30  . Diabetes Sister   . Heart disease Sister        MI CABG x 3  . Hypertension Sister   . Prostate cancer Neg Hx     Social History:  reports that he has never smoked. He has never used smokeless tobacco. He reports that he does not drink alcohol or use drugs.  ROS: UROLOGY Frequent Urination?: Yes Hard to postpone urination?: No Burning/pain with urination?: No Get up at night to urinate?: Yes Leakage of urine?: No Urine stream starts and stops?: No Trouble starting stream?: Yes Do you have to strain to urinate?: No Blood in urine?: No Urinary tract infection?: No Sexually transmitted disease?: No Injury to kidneys or bladder?: No  Painful intercourse?: No Weak stream?: Yes Erection problems?: No Penile pain?: No  Gastrointestinal Nausea?: No Vomiting?: No Indigestion/heartburn?: No Diarrhea?: No Constipation?: No  Constitutional Fever: No Night sweats?: No Weight loss?: No Fatigue?: No  Skin Skin rash/lesions?: No Itching?: No  Eyes Blurred vision?: No Double vision?: No  Ears/Nose/Throat Sore throat?: No Sinus problems?: No  Hematologic/Lymphatic Swollen glands?: No Easy bruising?: No  Cardiovascular Leg swelling?: No Chest pain?: No  Respiratory Cough?: No Shortness of breath?: No  Endocrine Excessive thirst?: No  Musculoskeletal Back pain?: No Joint pain?: No  Neurological Headaches?: No Dizziness?: No  Psychologic Depression?: No Anxiety?: No  Physical Exam: BP 124/63 (BP Location: Left Arm, Patient Position: Sitting,  Cuff Size: Normal)   Pulse 87   Ht 5\' 8"  (1.727 m)   Wt 168 lb 11.2 oz (76.5 kg)   BMI 25.65 kg/m   Constitutional:  Alert and oriented, No acute distress. HEENT: [place] AT, moist mucus membranes.  Trachea midline, no masses. Cardiovascular: No clubbing, cyanosis, or edema. Respiratory: Normal respiratory effort, no increased work of breathing. GI: Abdomen is soft, nontender, nondistended, no abdominal masses GU: No CVA tenderness.  Skin: No rashes, bruises or suspicious lesions. Lymph: No cervical or inguinal adenopathy. Neurologic: Grossly intact, no focal deficits, moving all 4 extremities. Psychiatric: Normal mood and affect.  Laboratory Data: Lab Results  Component Value Date   WBC 5.1 11/02/2010   HGB 15.9 11/02/2010   HCT 45.4 11/02/2010   MCV 96.3 11/02/2010   PLT 156.0 11/02/2010    Lab Results  Component Value Date   CREATININE 0.80 04/17/2017    Lab Results  Component Value Date   PSA 6.30 (H) 01/07/2017   PSA 11.06 (H) 12/10/2016   PSA 1.57 11/02/2010    No results found for: TESTOSTERONE  No results found for: HGBA1C  Urinalysis    Component Value Date/Time   BILIRUBINUR Neg 01/07/2017 1033   PROTEINUR Neg 01/07/2017 1033   UROBILINOGEN 0.2 01/07/2017 1033   NITRITE Neg 01/07/2017 1033   LEUKOCYTESUR Negative 01/07/2017 1033    Pertinent Imaging: CT and bone scan reviewed which showed no evidence of metastatic disease.  Assessment & Plan:    1. High-risk prostate cancer I went over the patient's imaging studies which showed no evidence of metastatic disease. We discussed that the goal this point would be cured via either robotic prostatectomy with pelvic lymph node dissection versus radiation therapy. We discussed the robotic prostatectomy again in great detail as well as the surgery and recovery time. He does understand there is significant risk for erectile dysfunction and urinary incontinence. I do not think he would be a good candidate for a  nerve sparing prostatectomy as his disease was located in the left side on his prostate biopsy and he had a nodule on the right side. We also discussed radiation therapy in the different treatment modalities. We discussed the risks and side effects of this as well. I would like for him to see Dr. Baruch Gouty, our radiation oncologist, to further discuss his options for radiation therapy. We will have him follow-up after he has a chance to speak with Dr. Baruch Gouty.  Return in about 4 weeks (around 05/16/2017) for after seeing rad onc.  Nickie Retort, MD  [facility] [address] [place], [place] [address] [phone]

## 2017-05-01 DIAGNOSIS — K1329 Other disturbances of oral epithelium, including tongue: Secondary | ICD-10-CM | POA: Diagnosis not present

## 2017-05-01 DIAGNOSIS — D3709 Neoplasm of uncertain behavior of other specified sites of the oral cavity: Secondary | ICD-10-CM | POA: Diagnosis not present

## 2017-05-03 ENCOUNTER — Ambulatory Visit
Admission: RE | Admit: 2017-05-03 | Discharge: 2017-05-03 | Disposition: A | Discharge status: Home / Self Care | Payer: PPO | Source: Ambulatory Visit | Attending: Radiation Oncology | Admitting: Radiation Oncology

## 2017-05-03 ENCOUNTER — Encounter: Payer: Self-pay | Admitting: Radiation Oncology

## 2017-05-03 VITALS — BP 131/67 | HR 63 | Temp 98.0°F | Wt 166.4 lb

## 2017-05-03 DIAGNOSIS — Z510 Encounter for antineoplastic radiation therapy: Secondary | ICD-10-CM | POA: Insufficient documentation

## 2017-05-03 DIAGNOSIS — C61 Malignant neoplasm of prostate: Secondary | ICD-10-CM

## 2017-05-03 DIAGNOSIS — Z79899 Other long term (current) drug therapy: Secondary | ICD-10-CM | POA: Diagnosis not present

## 2017-05-03 DIAGNOSIS — Z800 Family history of malignant neoplasm of digestive organs: Secondary | ICD-10-CM | POA: Diagnosis not present

## 2017-05-03 DIAGNOSIS — M25561 Pain in right knee: Secondary | ICD-10-CM | POA: Diagnosis not present

## 2017-05-03 DIAGNOSIS — E785 Hyperlipidemia, unspecified: Secondary | ICD-10-CM | POA: Diagnosis not present

## 2017-05-03 DIAGNOSIS — L719 Rosacea, unspecified: Secondary | ICD-10-CM | POA: Insufficient documentation

## 2017-05-03 DIAGNOSIS — Z923 Personal history of irradiation: Secondary | ICD-10-CM | POA: Diagnosis not present

## 2017-05-03 NOTE — Consult Note (Signed)
NEW PATIENT EVALUATION  Name: Shane Stewart  MRN: [id number]  Date:   05/03/2017     DOB: [birth year]   This 71 y.o. male patient presents to clinic for evaluation of stage IIb Gleason 8 (4+4) adenocarcinoma the prostate presenting with a PSA of 6.3  REFERRING PHYSICIAN: Tonia Ghent, MD  CHIEF COMPLAINT:  Chief Complaint  Patient presents with  . Prostate Cancer    Initial Evaluation    DIAGNOSIS: The encounter diagnosis was Malignant neoplasm of prostate ([place]).   PREVIOUS INVESTIGATIONS:  Bone scan and CT scan reviewed Clinical notes reviewed Pathology report reviewed  HPI: patient is a pleasant 71 year old male presented to his PMD with an elevated PSA of 6.3 which had waxed and waned over time. He was seen by urology underwent transrectal ultrasound-guided biopsy noted to have a 1 cm nodule at the right apex. Biopsy revealed 1 core of Gleason 8 (4+4). He had for of 12 cores involved. Other cores were positive for Gleason 7 (4+3 and Gleason 6 (3+3). CT scan was performed showing no evidence to suggest extracapsular extension or pelvic lymphadenopathy. Bone scan was negative for metastatic disease. He has very little symptoms. Specifically denies urgency frequency or nocturia. He has been seen by urology and he is now referred to radiation oncology for discussion of treatment.  PLANNED TREATMENT REGIMEN: ADT plus external beam radiation therapy plus I-125 interstitial implant for boost  PAST MEDICAL HISTORY:  has a past medical history of Cancer ([place]); Diverticulosis of colon (01/1999); History of CT scan (04/1985); Hyperlipidemia; Right knee pain; and Rosacea.    PAST SURGICAL HISTORY:  Past Surgical History:  Procedure Laterality Date  . LUMBAR SPINE SURGERY  1986   LS ruptured dis repair  . UMBILICAL HERNIA REPAIR  10/05/2005    FAMILY HISTORY: family history includes Alzheimer's disease in his mother; Cancer in his father; Colon cancer (age of onset: 50) in his father;  Dementia in his mother; Diabetes in his father and sister; Heart disease in his father and sister; Hypertension in his sister.  SOCIAL HISTORY:  reports that he has never smoked. He has never used smokeless tobacco. He reports that he does not drink alcohol or use drugs.  ALLERGIES: Patient has no known allergies.  MEDICATIONS:  Current Outpatient Prescriptions  Medication Sig Dispense Refill  . cholecalciferol (VITAMIN D) 1000 UNITS tablet Take 2,000 Units by mouth daily. Reported on 12/08/2015    . Hyaluronic Acid 400-80-40 MG CAPS as needed. Take one by mouth 3-4 times a week     . metroNIDAZOLE (METROGEL) 0.75 % gel Apply topically daily. Apply to affected area daily as needed 45 g 3  . Multiple Vitamin (MULTIVITAMIN) tablet Take 1 tablet by mouth daily.    Marland Kitchen triamcinolone cream (KENALOG) 0.5 % Apply 1 application topically 2 (two) times daily as needed. (Patient not taking: Reported on 04/18/2017) 30 g 1  . vitamin B-12 (CYANOCOBALAMIN) 1000 MCG tablet Take 1,000 mcg by mouth daily.     No current facility-administered medications for this encounter.     ECOG PERFORMANCE STATUS:  0 - Asymptomatic  REVIEW OF SYSTEMS:  Patient denies any weight loss, fatigue, weakness, fever, chills or night sweats. Patient denies any loss of vision, blurred vision. Patient denies any ringing  of the ears or hearing loss. No irregular heartbeat. Patient denies heart murmur or history of fainting. Patient denies any chest pain or pain radiating to her upper extremities. Patient denies any shortness of breath, difficulty breathing  at night, cough or hemoptysis. Patient denies any swelling in the lower legs. Patient denies any nausea vomiting, vomiting of blood, or coffee ground material in the vomitus. Patient denies any stomach pain. Patient states has had normal bowel movements no significant constipation or diarrhea. Patient denies any dysuria, hematuria or significant nocturia. Patient denies any problems  walking, swelling in the joints or loss of balance. Patient denies any skin changes, loss of hair or loss of weight. Patient denies any excessive worrying or anxiety or significant depression. Patient denies any problems with insomnia. Patient denies excessive thirst, polyuria, polydipsia. Patient denies any swollen glands, patient denies easy bruising or easy bleeding. Patient denies any recent infections, allergies or URI. Patient "s visual fields have not changed significantly in recent time.    PHYSICAL EXAM: BP 131/67   Pulse 63   Temp 98 F (36.7 C)   Wt 166 lb 7.2 oz (75.5 kg)   BMI 25.31 kg/m  On rectal exam rectal sphincter tone is good. Do not really feel a 1 cm mass in the right apex. Sulcus is preserved bilaterally. Seminal vesicle region appears unremarkable. No other rectal abnormalities identified. Well-developed well-nourished patient in NAD. HEENT reveals PERLA, EOMI, discs not visualized.  Oral cavity is clear. No oral mucosal lesions are identified. Neck is clear without evidence of cervical or supraclavicular adenopathy. Lungs are clear to A&P. Cardiac examination is essentially unremarkable with regular rate and rhythm without murmur rub or thrill. Abdomen is benign with no organomegaly or masses noted. Motor sensory and DTR levels are equal and symmetric in the upper and lower extremities. Cranial nerves II through XII are grossly intact. Proprioception is intact. No peripheral adenopathy or edema is identified. No motor or sensory levels are noted. Crude visual fields are within normal range.  LABORATORY DATA: pathology reports reviewed    RADIOLOGY RESULTS:CT scan and bone scan reviewedhigh risk prostate cancer in 71 year old male.   IMPRESSION: stage IIB  PLAN: I have run the [facility] nomogram for treatment options on prostate cancer. With his parameters he has 27% chance of organ confined disease. He also has a 20% chance of lymph node involvement. His  progression free probability after prostatectomy is 30% at 10 years. Based on those numbers I have discussed robotic prostatectomy versus tri-modality treatment with androgen deprivation therapy as well as external beam radiation to his prostate and pelvic nodes plus I-125 interstitial implant for boost. Patient is in favor of radiation therapy treatments and is asked to go ahead with scheduling of that. I have set him up and personally ordered CT simulation for next week. Would plan on delivering 5000 cGy to his prostate as well as 4500 cGy to his pelvic nodes using IM RT treatment planning and delivery. I would then boost his prostate using I-125 interstitial implant to115 gray. Risks and benefits of all forms of treatment were reviewed with the patient. Side effects such as increased lower urinary tract symptoms diarrhea fatigue alteration of blood counts all were described in detail. Patient comprehend my treatment plan well.  I would like to take this opportunity to thank you for allowing me to participate in the care of your patient.[name]., MD

## 2017-05-06 ENCOUNTER — Other Ambulatory Visit: Payer: Self-pay | Admitting: *Deleted

## 2017-05-06 DIAGNOSIS — C61 Malignant neoplasm of prostate: Secondary | ICD-10-CM

## 2017-05-07 ENCOUNTER — Inpatient Hospital Stay: Payer: PPO | Attending: Radiation Oncology

## 2017-05-07 ENCOUNTER — Ambulatory Visit
Admission: RE | Admit: 2017-05-07 | Discharge: 2017-05-07 | Disposition: A | Discharge status: Home / Self Care | Payer: PPO | Source: Ambulatory Visit | Attending: Radiation Oncology | Admitting: Radiation Oncology

## 2017-05-07 DIAGNOSIS — Z79818 Long term (current) use of other agents affecting estrogen receptors and estrogen levels: Secondary | ICD-10-CM | POA: Insufficient documentation

## 2017-05-07 DIAGNOSIS — Z510 Encounter for antineoplastic radiation therapy: Secondary | ICD-10-CM | POA: Diagnosis not present

## 2017-05-07 DIAGNOSIS — C61 Malignant neoplasm of prostate: Secondary | ICD-10-CM | POA: Insufficient documentation

## 2017-05-07 MED ORDER — LEUPROLIDE ACETATE (4 MONTH) 30 MG IM KIT
30.0000 mg | PACK | Freq: Once | INTRAMUSCULAR | Status: AC
  Administered 2017-05-07: 30 mg via INTRAMUSCULAR
  Filled 2017-05-07: qty 30

## 2017-05-08 DIAGNOSIS — Z510 Encounter for antineoplastic radiation therapy: Secondary | ICD-10-CM | POA: Diagnosis not present

## 2017-05-08 DIAGNOSIS — C61 Malignant neoplasm of prostate: Secondary | ICD-10-CM | POA: Diagnosis not present

## 2017-05-13 ENCOUNTER — Other Ambulatory Visit: Payer: Self-pay | Admitting: *Deleted

## 2017-05-13 DIAGNOSIS — C61 Malignant neoplasm of prostate: Secondary | ICD-10-CM

## 2017-05-16 ENCOUNTER — Ambulatory Visit
Admission: RE | Admit: 2017-05-16 | Discharge: 2017-05-16 | Disposition: A | Discharge status: Home / Self Care | Payer: PPO | Source: Ambulatory Visit | Attending: Radiation Oncology | Admitting: Radiation Oncology

## 2017-05-20 ENCOUNTER — Ambulatory Visit
Admission: RE | Admit: 2017-05-20 | Discharge: 2017-05-20 | Disposition: A | Discharge status: Home / Self Care | Payer: PPO | Source: Ambulatory Visit | Attending: Radiation Oncology | Admitting: Radiation Oncology

## 2017-05-20 DIAGNOSIS — C61 Malignant neoplasm of prostate: Secondary | ICD-10-CM | POA: Diagnosis not present

## 2017-05-20 DIAGNOSIS — Z510 Encounter for antineoplastic radiation therapy: Secondary | ICD-10-CM | POA: Diagnosis not present

## 2017-05-21 ENCOUNTER — Ambulatory Visit
Admission: RE | Admit: 2017-05-21 | Discharge: 2017-05-21 | Disposition: A | Discharge status: Home / Self Care | Payer: PPO | Source: Ambulatory Visit | Attending: Radiation Oncology | Admitting: Radiation Oncology

## 2017-05-21 DIAGNOSIS — Z510 Encounter for antineoplastic radiation therapy: Secondary | ICD-10-CM | POA: Diagnosis not present

## 2017-05-21 DIAGNOSIS — C61 Malignant neoplasm of prostate: Secondary | ICD-10-CM | POA: Diagnosis not present

## 2017-05-22 ENCOUNTER — Ambulatory Visit
Admission: RE | Admit: 2017-05-22 | Discharge: 2017-05-22 | Disposition: A | Discharge status: Home / Self Care | Payer: PPO | Source: Ambulatory Visit | Attending: Radiation Oncology | Admitting: Radiation Oncology

## 2017-05-22 DIAGNOSIS — Z510 Encounter for antineoplastic radiation therapy: Secondary | ICD-10-CM | POA: Diagnosis not present

## 2017-05-22 DIAGNOSIS — C61 Malignant neoplasm of prostate: Secondary | ICD-10-CM | POA: Diagnosis not present

## 2017-05-23 ENCOUNTER — Ambulatory Visit
Admission: RE | Admit: 2017-05-23 | Discharge: 2017-05-23 | Disposition: A | Discharge status: Home / Self Care | Payer: PPO | Source: Ambulatory Visit | Attending: Radiation Oncology | Admitting: Radiation Oncology

## 2017-05-23 DIAGNOSIS — Z510 Encounter for antineoplastic radiation therapy: Secondary | ICD-10-CM | POA: Diagnosis not present

## 2017-05-23 DIAGNOSIS — C61 Malignant neoplasm of prostate: Secondary | ICD-10-CM | POA: Diagnosis not present

## 2017-05-24 ENCOUNTER — Ambulatory Visit
Admission: RE | Admit: 2017-05-24 | Discharge: 2017-05-24 | Disposition: A | Discharge status: Home / Self Care | Payer: PPO | Source: Ambulatory Visit | Attending: Radiation Oncology | Admitting: Radiation Oncology

## 2017-05-24 DIAGNOSIS — Z510 Encounter for antineoplastic radiation therapy: Secondary | ICD-10-CM | POA: Diagnosis not present

## 2017-05-24 DIAGNOSIS — C61 Malignant neoplasm of prostate: Secondary | ICD-10-CM | POA: Diagnosis not present

## 2017-05-28 ENCOUNTER — Ambulatory Visit
Admission: RE | Admit: 2017-05-28 | Discharge: 2017-05-28 | Disposition: A | Discharge status: Home / Self Care | Payer: PPO | Source: Ambulatory Visit | Attending: Radiation Oncology | Admitting: Radiation Oncology

## 2017-05-28 DIAGNOSIS — C61 Malignant neoplasm of prostate: Secondary | ICD-10-CM | POA: Diagnosis not present

## 2017-05-28 DIAGNOSIS — Z510 Encounter for antineoplastic radiation therapy: Secondary | ICD-10-CM | POA: Diagnosis not present

## 2017-05-29 ENCOUNTER — Ambulatory Visit
Admission: RE | Admit: 2017-05-29 | Discharge: 2017-05-29 | Disposition: A | Discharge status: Home / Self Care | Payer: PPO | Source: Ambulatory Visit | Attending: Radiation Oncology | Admitting: Radiation Oncology

## 2017-05-29 DIAGNOSIS — Z510 Encounter for antineoplastic radiation therapy: Secondary | ICD-10-CM | POA: Diagnosis not present

## 2017-05-29 DIAGNOSIS — C61 Malignant neoplasm of prostate: Secondary | ICD-10-CM | POA: Diagnosis not present

## 2017-05-30 ENCOUNTER — Other Ambulatory Visit: Payer: Self-pay | Admitting: Radiology

## 2017-05-30 ENCOUNTER — Ambulatory Visit
Admission: RE | Admit: 2017-05-30 | Discharge: 2017-05-30 | Disposition: A | Discharge status: Home / Self Care | Payer: PPO | Source: Ambulatory Visit | Attending: Radiation Oncology | Admitting: Radiation Oncology

## 2017-05-30 DIAGNOSIS — C61 Malignant neoplasm of prostate: Secondary | ICD-10-CM

## 2017-05-30 DIAGNOSIS — Z510 Encounter for antineoplastic radiation therapy: Secondary | ICD-10-CM | POA: Diagnosis not present

## 2017-05-30 NOTE — Progress Notes (Unsigned)
error 

## 2017-05-31 ENCOUNTER — Ambulatory Visit
Admission: RE | Admit: 2017-05-31 | Discharge: 2017-05-31 | Disposition: A | Discharge status: Home / Self Care | Payer: PPO | Source: Ambulatory Visit | Attending: Radiation Oncology | Admitting: Radiation Oncology

## 2017-05-31 DIAGNOSIS — C61 Malignant neoplasm of prostate: Secondary | ICD-10-CM | POA: Diagnosis not present

## 2017-05-31 DIAGNOSIS — Z510 Encounter for antineoplastic radiation therapy: Secondary | ICD-10-CM | POA: Diagnosis not present

## 2017-06-03 ENCOUNTER — Inpatient Hospital Stay: Payer: PPO | Attending: Radiation Oncology

## 2017-06-03 ENCOUNTER — Ambulatory Visit
Admission: RE | Admit: 2017-06-03 | Discharge: 2017-06-03 | Disposition: A | Discharge status: Home / Self Care | Payer: PPO | Source: Ambulatory Visit | Attending: Radiation Oncology | Admitting: Radiation Oncology

## 2017-06-03 DIAGNOSIS — C61 Malignant neoplasm of prostate: Secondary | ICD-10-CM | POA: Insufficient documentation

## 2017-06-03 DIAGNOSIS — Z510 Encounter for antineoplastic radiation therapy: Secondary | ICD-10-CM | POA: Diagnosis not present

## 2017-06-03 LAB — CBC
HCT: 42.3 % (ref 40.0–52.0)
HEMOGLOBIN: 15 g/dL (ref 13.0–18.0)
MCH: 33.8 pg (ref 26.0–34.0)
MCHC: 35.5 g/dL (ref 32.0–36.0)
MCV: 95.4 fL (ref 80.0–100.0)
Platelets: 129 10*3/uL — ABNORMAL LOW (ref 150–440)
RBC: 4.43 MIL/uL (ref 4.40–5.90)
RDW: 12.4 % (ref 11.5–14.5)
WBC: 3.6 10*3/uL — ABNORMAL LOW (ref 3.8–10.6)

## 2017-06-04 ENCOUNTER — Ambulatory Visit
Admission: RE | Admit: 2017-06-04 | Discharge: 2017-06-04 | Disposition: A | Discharge status: Home / Self Care | Payer: PPO | Source: Ambulatory Visit | Attending: Radiation Oncology | Admitting: Radiation Oncology

## 2017-06-04 DIAGNOSIS — C61 Malignant neoplasm of prostate: Secondary | ICD-10-CM | POA: Diagnosis not present

## 2017-06-04 DIAGNOSIS — Z510 Encounter for antineoplastic radiation therapy: Secondary | ICD-10-CM | POA: Diagnosis not present

## 2017-06-05 ENCOUNTER — Ambulatory Visit
Admission: RE | Admit: 2017-06-05 | Discharge: 2017-06-05 | Disposition: A | Discharge status: Home / Self Care | Payer: PPO | Source: Ambulatory Visit | Attending: Radiation Oncology | Admitting: Radiation Oncology

## 2017-06-05 DIAGNOSIS — C61 Malignant neoplasm of prostate: Secondary | ICD-10-CM | POA: Diagnosis not present

## 2017-06-05 DIAGNOSIS — Z510 Encounter for antineoplastic radiation therapy: Secondary | ICD-10-CM | POA: Diagnosis not present

## 2017-06-06 ENCOUNTER — Ambulatory Visit
Admission: RE | Admit: 2017-06-06 | Discharge: 2017-06-06 | Disposition: A | Discharge status: Home / Self Care | Payer: PPO | Source: Ambulatory Visit | Attending: Radiation Oncology | Admitting: Radiation Oncology

## 2017-06-06 DIAGNOSIS — C61 Malignant neoplasm of prostate: Secondary | ICD-10-CM | POA: Diagnosis not present

## 2017-06-06 DIAGNOSIS — Z510 Encounter for antineoplastic radiation therapy: Secondary | ICD-10-CM | POA: Diagnosis not present

## 2017-06-07 ENCOUNTER — Encounter: Payer: Self-pay | Admitting: Family Medicine

## 2017-06-07 ENCOUNTER — Ambulatory Visit
Admission: RE | Admit: 2017-06-07 | Discharge: 2017-06-07 | Disposition: A | Discharge status: Home / Self Care | Payer: PPO | Source: Ambulatory Visit | Attending: Radiation Oncology | Admitting: Radiation Oncology

## 2017-06-07 ENCOUNTER — Encounter: Payer: Self-pay | Admitting: Radiation Oncology

## 2017-06-07 DIAGNOSIS — Z510 Encounter for antineoplastic radiation therapy: Secondary | ICD-10-CM | POA: Diagnosis not present

## 2017-06-07 DIAGNOSIS — C61 Malignant neoplasm of prostate: Secondary | ICD-10-CM | POA: Diagnosis not present

## 2017-06-10 ENCOUNTER — Ambulatory Visit: Admission: RE | Admit: 2017-06-10 | Payer: PPO | Source: Ambulatory Visit

## 2017-06-10 ENCOUNTER — Ambulatory Visit: Payer: PPO

## 2017-06-11 ENCOUNTER — Ambulatory Visit
Admission: RE | Admit: 2017-06-11 | Discharge: 2017-06-11 | Disposition: A | Discharge status: Home / Self Care | Payer: PPO | Source: Ambulatory Visit | Attending: Radiation Oncology | Admitting: Radiation Oncology

## 2017-06-11 DIAGNOSIS — C61 Malignant neoplasm of prostate: Secondary | ICD-10-CM | POA: Diagnosis not present

## 2017-06-11 DIAGNOSIS — Z510 Encounter for antineoplastic radiation therapy: Secondary | ICD-10-CM | POA: Diagnosis not present

## 2017-06-12 ENCOUNTER — Ambulatory Visit
Admission: RE | Admit: 2017-06-12 | Discharge: 2017-06-12 | Disposition: A | Discharge status: Home / Self Care | Payer: PPO | Source: Ambulatory Visit | Attending: Radiation Oncology | Admitting: Radiation Oncology

## 2017-06-12 DIAGNOSIS — Z510 Encounter for antineoplastic radiation therapy: Secondary | ICD-10-CM | POA: Diagnosis not present

## 2017-06-12 DIAGNOSIS — C61 Malignant neoplasm of prostate: Secondary | ICD-10-CM | POA: Diagnosis not present

## 2017-06-13 ENCOUNTER — Ambulatory Visit
Admission: RE | Admit: 2017-06-13 | Discharge: 2017-06-13 | Disposition: A | Discharge status: Home / Self Care | Payer: PPO | Source: Ambulatory Visit | Attending: Radiation Oncology | Admitting: Radiation Oncology

## 2017-06-13 DIAGNOSIS — C61 Malignant neoplasm of prostate: Secondary | ICD-10-CM | POA: Diagnosis not present

## 2017-06-13 DIAGNOSIS — Z510 Encounter for antineoplastic radiation therapy: Secondary | ICD-10-CM | POA: Diagnosis not present

## 2017-06-14 ENCOUNTER — Ambulatory Visit
Admission: RE | Admit: 2017-06-14 | Discharge: 2017-06-14 | Disposition: A | Discharge status: Home / Self Care | Payer: PPO | Source: Ambulatory Visit | Attending: Radiation Oncology | Admitting: Radiation Oncology

## 2017-06-14 DIAGNOSIS — C61 Malignant neoplasm of prostate: Secondary | ICD-10-CM | POA: Diagnosis not present

## 2017-06-14 DIAGNOSIS — Z510 Encounter for antineoplastic radiation therapy: Secondary | ICD-10-CM | POA: Diagnosis not present

## 2017-06-17 ENCOUNTER — Ambulatory Visit
Admission: RE | Admit: 2017-06-17 | Discharge: 2017-06-17 | Disposition: A | Discharge status: Home / Self Care | Payer: PPO | Source: Ambulatory Visit | Attending: Radiation Oncology | Admitting: Radiation Oncology

## 2017-06-17 ENCOUNTER — Inpatient Hospital Stay: Payer: PPO

## 2017-06-17 ENCOUNTER — Ambulatory Visit: Payer: PPO

## 2017-06-17 ENCOUNTER — Ambulatory Visit: Payer: PPO | Admitting: Radiation Oncology

## 2017-06-17 DIAGNOSIS — C61 Malignant neoplasm of prostate: Secondary | ICD-10-CM

## 2017-06-17 DIAGNOSIS — Z510 Encounter for antineoplastic radiation therapy: Secondary | ICD-10-CM | POA: Diagnosis not present

## 2017-06-17 LAB — CBC
HCT: 41.7 % (ref 40.0–52.0)
HEMOGLOBIN: 14.8 g/dL (ref 13.0–18.0)
MCH: 33.8 pg (ref 26.0–34.0)
MCHC: 35.6 g/dL (ref 32.0–36.0)
MCV: 95 fL (ref 80.0–100.0)
Platelets: 122 10*3/uL — ABNORMAL LOW (ref 150–440)
RBC: 4.39 MIL/uL — AB (ref 4.40–5.90)
RDW: 12.5 % (ref 11.5–14.5)
WBC: 3.5 10*3/uL — AB (ref 3.8–10.6)

## 2017-06-18 ENCOUNTER — Ambulatory Visit
Admission: RE | Admit: 2017-06-18 | Discharge: 2017-06-18 | Disposition: A | Discharge status: Home / Self Care | Payer: PPO | Source: Ambulatory Visit | Attending: Radiation Oncology | Admitting: Radiation Oncology

## 2017-06-18 ENCOUNTER — Encounter: Payer: Self-pay | Admitting: Radiation Oncology

## 2017-06-18 ENCOUNTER — Ambulatory Visit: Payer: PPO

## 2017-06-18 ENCOUNTER — Other Ambulatory Visit: Payer: PPO

## 2017-06-18 ENCOUNTER — Ambulatory Visit: Admission: RE | Admit: 2017-06-18 | Payer: PPO | Source: Ambulatory Visit | Admitting: Urology

## 2017-06-18 VITALS — BP 128/77 | HR 66 | Temp 96.8°F | Wt 162.8 lb

## 2017-06-18 DIAGNOSIS — Z923 Personal history of irradiation: Secondary | ICD-10-CM | POA: Insufficient documentation

## 2017-06-18 DIAGNOSIS — C61 Malignant neoplasm of prostate: Secondary | ICD-10-CM

## 2017-06-18 DIAGNOSIS — Z510 Encounter for antineoplastic radiation therapy: Secondary | ICD-10-CM | POA: Diagnosis not present

## 2017-06-18 SURGERY — ULTRASOUND, PROSTATE, FOR VOLUME DETERMINATION
Anesthesia: Choice

## 2017-06-19 ENCOUNTER — Ambulatory Visit
Admission: RE | Admit: 2017-06-19 | Discharge: 2017-06-19 | Disposition: A | Discharge status: Home / Self Care | Payer: PPO | Source: Ambulatory Visit | Attending: Radiation Oncology | Admitting: Radiation Oncology

## 2017-06-19 DIAGNOSIS — C61 Malignant neoplasm of prostate: Secondary | ICD-10-CM | POA: Diagnosis not present

## 2017-06-19 DIAGNOSIS — Z510 Encounter for antineoplastic radiation therapy: Secondary | ICD-10-CM | POA: Diagnosis not present

## 2017-06-20 ENCOUNTER — Ambulatory Visit
Admission: RE | Admit: 2017-06-20 | Discharge: 2017-06-20 | Disposition: A | Discharge status: Home / Self Care | Payer: PPO | Source: Ambulatory Visit | Attending: Radiation Oncology | Admitting: Radiation Oncology

## 2017-06-20 DIAGNOSIS — C61 Malignant neoplasm of prostate: Secondary | ICD-10-CM | POA: Diagnosis not present

## 2017-06-20 DIAGNOSIS — Z510 Encounter for antineoplastic radiation therapy: Secondary | ICD-10-CM | POA: Diagnosis not present

## 2017-06-20 NOTE — H&P (Signed)
NEW PATIENT EVALUATION  Name: Shane Stewart  MRN: [id number]  Date:   06/18/2017     DOB: 1946/05/27   This 71 y.o. male patient presents to the clinic for History and physical regarding volume study prior to I-125 interstitial implant for stage IIB prostate cancer  REFERRING PHYSICIAN: Tonia Ghent, MD  CHIEF COMPLAINT:  Chief Complaint  Patient presents with  . Prostate Cancer    follow up Volume Study    DIAGNOSIS: The encounter diagnosis was Malignant neoplasm of prostate ([place]).   PREVIOUS INVESTIGATIONS:  Previous notes reviewed  HPI: patient is a 71 year old male who presentedwith elevated PSA of 6.3. He underwent transrectal ultrasound-guided biopsy positive for Gleason 8 (4+4) in 1 core as well as Gleason 7 (4+3). Workup including CT scan showed no evidence of extracapsular extension or pelvic lymphadenopathy and bone scan was negative. He's been undergoing external beam radiation therapy both his prostate and pelvic nodes he is seen today for volume study and history and physical prior to his implant. He is doing well has some increased lower urinary tract symptoms otherwise tolerating treatment extremely well  PLANNED TREATMENT REGIMEN: I-125 interstitial implant for boost  PAST MEDICAL HISTORY:  has a past medical history of Cancer ([place]); Diverticulosis of colon (01/1999); History of CT scan (04/1985); Hyperlipidemia; Right knee pain; and Rosacea.    PAST SURGICAL HISTORY:  Past Surgical History:  Procedure Laterality Date  . LUMBAR SPINE SURGERY  1986   LS ruptured dis repair  . UMBILICAL HERNIA REPAIR  10/05/2005    FAMILY HISTORY: family history includes Alzheimer's disease in his mother; Cancer in his father; Colon cancer (age of onset: 44) in his father; Dementia in his mother; Diabetes in his father and sister; Heart disease in his father and sister; Hypertension in his sister.  SOCIAL HISTORY:  reports that he has never smoked. He has never used smokeless  tobacco. He reports that he does not drink alcohol or use drugs.  ALLERGIES: Patient has no known allergies.  MEDICATIONS:  Current Outpatient Prescriptions  Medication Sig Dispense Refill  . Cholecalciferol 2000 units TABS Take 2,000 Units by mouth daily.    Marland Kitchen loperamide (IMODIUM) 2 MG capsule Take 2 mg by mouth as needed for diarrhea or loose stools.    . metroNIDAZOLE (METROGEL) 0.75 % gel Apply topically daily. Apply to affected area daily as needed (Patient taking differently: Apply 1 application topically daily. Apply to affected area daily as needed) 45 g 3  . Multiple Vitamin (MULTIVITAMIN) tablet Take 1 tablet by mouth daily.    Marland Kitchen triamcinolone cream (KENALOG) 0.5 % Apply 1 application topically 2 (two) times daily as needed. (Patient not taking: Reported on 04/18/2017) 30 g 1  . vitamin B-12 (CYANOCOBALAMIN) 1000 MCG tablet Take 1,000 mcg by mouth daily.     No current facility-administered medications for this encounter.     ECOG PERFORMANCE STATUS:  0 - Asymptomatic  REVIEW OF SYSTEMS:  Patient denies any weight loss, fatigue, weakness, fever, chills or night sweats. Patient denies any loss of vision, blurred vision. Patient denies any ringing  of the ears or hearing loss. No irregular heartbeat. Patient denies heart murmur or history of fainting. Patient denies any chest pain or pain radiating to her upper extremities. Patient denies any shortness of breath, difficulty breathing at night, cough or hemoptysis. Patient denies any swelling in the lower legs. Patient denies any nausea vomiting, vomiting of blood, or coffee ground material in the vomitus. Patient denies  any stomach pain. Patient states has had normal bowel movements no significant constipation or diarrhea. Patient denies any dysuria, hematuria or significant nocturia. Patient denies any problems walking, swelling in the joints or loss of balance. Patient denies any skin changes, loss of hair or loss of weight. Patient  denies any excessive worrying or anxiety or significant depression. Patient denies any problems with insomnia. Patient denies excessive thirst, polyuria, polydipsia. Patient denies any swollen glands, patient denies easy bruising or easy bleeding. Patient denies any recent infections, allergies or URI. Patient "s visual fields have not changed significantly in recent time.    PHYSICAL EXAM: BP 128/77   Pulse 66   Temp (!) 96.8 F (36 C)   Wt 162 lb 13 oz (73.9 kg)   BMI 24.76 kg/m  Well-developed well-nourished patient in NAD. HEENT reveals PERLA, EOMI, discs not visualized.  Oral cavity is clear. No oral mucosal lesions are identified. Neck is clear without evidence of cervical or supraclavicular adenopathy. Lungs are clear to A&P. Cardiac examination is essentially unremarkable with regular rate and rhythm without murmur rub or thrill. Abdomen is benign with no organomegaly or masses noted. Motor sensory and DTR levels are equal and symmetric in the upper and lower extremities. Cranial nerves II through XII are grossly intact. Proprioception is intact. No peripheral adenopathy or edema is identified. No motor or sensory levels are noted. Crude visual fields are within normal range.  LABORATORY DATA: labs to be performed    RADIOLOGY RESULTS:ultrasound use today for volume study   IMPRESSION: present time patient has a stageto be Gleason 8 adenocarcinoma the prostate undergoing pelvic radiation as well as prosthetic radiation and seen today for volume study in anticipation of I-125 interstitial implant for boost  PLAN: present time patient is clear to proceed with implant. We have performed volume study today which went successfully. Treatment planning will be performed on the volume study todayrisks and benefits and including radiation safety precautions were all discussed in detail with the patient today he compresses her treatment plan well.  I would like to take this opportunity to thank  you for allowing me to participate in the care of your patient.[name]., MD

## 2017-06-20 NOTE — Progress Notes (Signed)
Radiation Oncology Follow up Note  Name: Shane Stewart   Date:   06/18/2017 MRN:  [id number] DOB: December 30, 1945    This 71 y.o. male presents to the hospital for volume study  REFERRING PROVIDER: Tonia Ghent, MD  [id number] is a 71 year old male who presentedwith elevated PSA of 6.3. He underwent transrectal ultrasound-guided biopsy positive for Gleason 8 (4+4) in 1 core as well as Gleason 7 (4+3). Workup including CT scan showed no evidence of extracapsular extension or pelvic lymphadenopathy and bone scan was negative. He's been undergoing external beam radiation therapy both his prostate and pelvic nodes he is seen today for volume study and history and physical prior to his implant. He is doing well has some increased lower urinary tract symptoms otherwise tolerating treatment extremely well  COMPLICATIONS OF TREATMENT: none  FOLLOW UP COMPLIANCE: keeps appointments   PHYSICAL EXAM:  There were no vitals taken for this visit. Well-developed well-nourished patient in NAD. HEENT reveals PERLA, EOMI, discs not visualized.  Oral cavity is clear. No oral mucosal lesions are identified. Neck is clear without evidence of cervical or supraclavicular adenopathy. Lungs are clear to A&P. Cardiac examination is essentially unremarkable with regular rate and rhythm without murmur rub or thrill. Abdomen is benign with no organomegaly or masses noted. Motor sensory and DTR levels are equal and symmetric in the upper and lower extremities. Cranial nerves II through XII are grossly intact. Proprioception is intact. No peripheral adenopathy or edema is identified. No motor or sensory levels are noted. Crude visual fields are within normal range.  RADIOLOGY RESULTS: ultrasound used for volume study  PLAN: Patient was taken to the cystoscopy suite in the OR. Patient was placed in the low lithotomy position. Foley catheter was placed. Trans-rectal ultrasound probe was inserted into the rectum and prostate  seminal vesicles were visualized as well as bladder base. stepping images were performed on a 5 mm increments. Images will be placed in BrachyVision treatment planning system to determine seed placement coordinates for eventual I-125 interstitial implant. Images will be reviewed with the physics and dosimetry staff for final quality approval. I personally was present for the volume study and assisted in delineation of contour volumes.  At the end of the procedure Foley catheter was removed, rectal ultrasound probe was removed. Patient tolerated his procedures extremely well with no side effects or complaints. Patient has given appointment for interstitial implant date. Consent was signed today as well as history and physical performed in preparation for his outpatient surgical implant.      [name]., MD

## 2017-06-21 ENCOUNTER — Ambulatory Visit
Admission: RE | Admit: 2017-06-21 | Discharge: 2017-06-21 | Disposition: A | Discharge status: Home / Self Care | Payer: PPO | Source: Ambulatory Visit | Attending: Radiation Oncology | Admitting: Radiation Oncology

## 2017-06-21 DIAGNOSIS — C61 Malignant neoplasm of prostate: Secondary | ICD-10-CM | POA: Diagnosis not present

## 2017-06-21 DIAGNOSIS — Z510 Encounter for antineoplastic radiation therapy: Secondary | ICD-10-CM | POA: Diagnosis not present

## 2017-06-24 ENCOUNTER — Ambulatory Visit
Admission: RE | Admit: 2017-06-24 | Discharge: 2017-06-24 | Disposition: A | Discharge status: Home / Self Care | Payer: PPO | Source: Ambulatory Visit | Attending: Radiation Oncology | Admitting: Radiation Oncology

## 2017-06-24 DIAGNOSIS — Z510 Encounter for antineoplastic radiation therapy: Secondary | ICD-10-CM | POA: Diagnosis not present

## 2017-06-24 DIAGNOSIS — C61 Malignant neoplasm of prostate: Secondary | ICD-10-CM | POA: Diagnosis not present

## 2017-06-25 ENCOUNTER — Ambulatory Visit
Admission: RE | Admit: 2017-06-25 | Discharge: 2017-06-25 | Disposition: A | Discharge status: Home / Self Care | Payer: PPO | Source: Ambulatory Visit | Attending: Radiation Oncology | Admitting: Radiation Oncology

## 2017-06-25 ENCOUNTER — Other Ambulatory Visit: Payer: Self-pay | Admitting: *Deleted

## 2017-06-25 ENCOUNTER — Ambulatory Visit: Payer: PPO

## 2017-06-25 DIAGNOSIS — Z510 Encounter for antineoplastic radiation therapy: Secondary | ICD-10-CM | POA: Diagnosis not present

## 2017-06-25 DIAGNOSIS — C61 Malignant neoplasm of prostate: Secondary | ICD-10-CM | POA: Diagnosis not present

## 2017-06-25 MED ORDER — TAMSULOSIN HCL 0.4 MG PO CAPS: 0.4000 mg | ORAL_CAPSULE | Freq: Every day | ORAL | 6 refills | Status: DC

## 2017-06-26 ENCOUNTER — Ambulatory Visit
Admission: RE | Admit: 2017-06-26 | Discharge: 2017-06-26 | Disposition: A | Discharge status: Home / Self Care | Payer: PPO | Source: Ambulatory Visit | Attending: Radiation Oncology | Admitting: Radiation Oncology

## 2017-06-26 DIAGNOSIS — Z510 Encounter for antineoplastic radiation therapy: Secondary | ICD-10-CM | POA: Diagnosis not present

## 2017-06-26 DIAGNOSIS — C61 Malignant neoplasm of prostate: Secondary | ICD-10-CM | POA: Diagnosis not present

## 2017-06-29 ENCOUNTER — Encounter: Payer: Self-pay | Admitting: Family Medicine

## 2017-07-03 DIAGNOSIS — Z510 Encounter for antineoplastic radiation therapy: Secondary | ICD-10-CM | POA: Diagnosis not present

## 2017-07-03 DIAGNOSIS — C61 Malignant neoplasm of prostate: Secondary | ICD-10-CM | POA: Diagnosis not present

## 2017-07-05 ENCOUNTER — Encounter
Admission: RE | Admit: 2017-07-05 | Discharge: 2017-07-05 | Disposition: A | Discharge status: Home / Self Care | Payer: PPO | Source: Ambulatory Visit | Attending: Urology | Admitting: Urology

## 2017-07-05 DIAGNOSIS — Z01812 Encounter for preprocedural laboratory examination: Secondary | ICD-10-CM | POA: Diagnosis not present

## 2017-07-05 DIAGNOSIS — Z01818 Encounter for other preprocedural examination: Secondary | ICD-10-CM | POA: Insufficient documentation

## 2017-07-05 DIAGNOSIS — R001 Bradycardia, unspecified: Secondary | ICD-10-CM | POA: Diagnosis not present

## 2017-07-05 DIAGNOSIS — C61 Malignant neoplasm of prostate: Secondary | ICD-10-CM

## 2017-07-05 LAB — CBC
HCT: 43.4 % (ref 40.0–52.0)
Hemoglobin: 15.1 g/dL (ref 13.0–18.0)
MCH: 33.5 pg (ref 26.0–34.0)
MCHC: 34.8 g/dL (ref 32.0–36.0)
MCV: 96.4 fL (ref 80.0–100.0)
PLATELETS: 139 10*3/uL — AB (ref 150–440)
RBC: 4.5 MIL/uL (ref 4.40–5.90)
RDW: 13 % (ref 11.5–14.5)
WBC: 3.8 10*3/uL (ref 3.8–10.6)

## 2017-07-05 MED ORDER — FLEET ENEMA 7-19 GM/118ML RE ENEM
1.0000 | ENEMA | Freq: Once | RECTAL | Status: DC
  Filled 2017-07-05: qty 1

## 2017-07-05 NOTE — Patient Instructions (Signed)
Your procedure is scheduled on: Tues. 07/16/17 Report to Day Surgery. To find out your arrival time please call [phone] between 1PM - 3PM on Mon. 07/15/17.  Remember: Instructions that are not followed completely may result in serious medical risk, up to and including death, or upon the discretion of your surgeon and anesthesiologist your surgery may need to be rescheduled.     _X__ 1. Do not eat food after midnight the night before your procedure.                 No gum chewing or hard candies. You may drink clear liquids up to 2 hours                 before you are scheduled to arrive for your surgery- DO not drink clear                 liquids within 2 hours of the start of your surgery.                 Clear Liquids include:  water, apple juice without pulp, clear carbohydrate                 drink such as Clearfast of Gartorade, Black Coffee or Tea (Do not add                 anything to coffee or tea).     _X__ 2.  No Alcohol for 24 hours before or after surgery.   __ 3.  Do Not Smoke or use e-cigarettes For 24 Hours Prior to Your Surgery.                 Do not use any chewable tobacco products for at least 6 hours prior to                 surgery.  ____  4.  Bring all medications with you on the day of surgery if instructed.   __x__  5.  Notify your doctor if there is any change in your medical condition      (cold, fever, infections).     Do not wear jewelry, make-up, hairpins, clips or nail polish. Do not wear lotions, powders, or perfumes. You may wear deodorant. Do not shave 48 hours prior to surgery. Men may shave face and neck. Do not bring valuables to the hospital.    [facility] is not responsible for any belongings or valuables.  Contacts, dentures or bridgework may not be worn into surgery. Leave your suitcase in the car. After surgery it may be brought to your room. For patients admitted to the hospital, discharge time is determined  by your treatment team.   Patients discharged the day of surgery will not be allowed to drive home.   Please read over the following fact sheets that you were given:    ____ Take these medicines the morning of surgery with A SIP OF WATER:    1.none  2.   3.   4.  5.  6.  ____ Fleet Enema (as directed)   ____ Use CHG Soap as directed  ____ Use inhalers on the day of surgery  ____ Stop metformin 2 days prior to surgery    ____ Take 1/2 of usual insulin dose the night before surgery. No insulin the morning          of surgery.   ____ Stop Coumadin/Plavix/aspirin on   ____ Stop Anti-inflammatories on  ____ Stop supplements until after surgery.    ____ Bring C-Pap to the hospital.

## 2017-07-09 ENCOUNTER — Encounter: Payer: Self-pay | Admitting: Family Medicine

## 2017-07-15 MED ORDER — CIPROFLOXACIN IN D5W 400 MG/200ML IV SOLN
400.0000 mg | INTRAVENOUS | Status: AC
  Administered 2017-07-16: 400 mg via INTRAVENOUS

## 2017-07-16 ENCOUNTER — Encounter
Admission: RE | Disposition: A | Discharge status: Home / Self Care | Payer: Self-pay | Source: Ambulatory Visit | Attending: Urology

## 2017-07-16 ENCOUNTER — Ambulatory Visit: Payer: PPO | Admitting: Anesthesiology

## 2017-07-16 ENCOUNTER — Ambulatory Visit
Admission: RE | Admit: 2017-07-16 | Discharge: 2017-07-16 | Disposition: A | Discharge status: Home / Self Care | Payer: PPO | Source: Ambulatory Visit | Attending: Urology | Admitting: Urology

## 2017-07-16 ENCOUNTER — Ambulatory Visit
Admission: RE | Admit: 2017-07-16 | Discharge: 2017-07-16 | Disposition: A | Discharge status: Home / Self Care | Payer: PPO | Source: Ambulatory Visit | Attending: Radiation Oncology | Admitting: Radiation Oncology

## 2017-07-16 DIAGNOSIS — Z510 Encounter for antineoplastic radiation therapy: Secondary | ICD-10-CM | POA: Diagnosis not present

## 2017-07-16 DIAGNOSIS — C61 Malignant neoplasm of prostate: Secondary | ICD-10-CM | POA: Diagnosis not present

## 2017-07-16 DIAGNOSIS — E785 Hyperlipidemia, unspecified: Secondary | ICD-10-CM | POA: Insufficient documentation

## 2017-07-16 DIAGNOSIS — L719 Rosacea, unspecified: Secondary | ICD-10-CM | POA: Insufficient documentation

## 2017-07-16 DIAGNOSIS — Z79899 Other long term (current) drug therapy: Secondary | ICD-10-CM | POA: Diagnosis not present

## 2017-07-16 HISTORY — PX: RADIOACTIVE SEED IMPLANT: SHX5150

## 2017-07-16 HISTORY — PX: CYSTOSCOPY: SHX5120

## 2017-07-16 SURGERY — INSERTION, RADIATION SOURCE, PROSTATE
Anesthesia: General

## 2017-07-16 MED ORDER — FAMOTIDINE 20 MG PO TABS
20.0000 mg | ORAL_TABLET | Freq: Once | ORAL | Status: AC
  Administered 2017-07-16: 20 mg via ORAL

## 2017-07-16 MED ORDER — HYDROCODONE-ACETAMINOPHEN 5-325 MG PO TABS: 1.0000 | ORAL_TABLET | Freq: Four times a day (QID) | ORAL | 0 refills | Status: DC | PRN

## 2017-07-16 MED ORDER — ROCURONIUM BROMIDE 50 MG/5ML IV SOLN
INTRAVENOUS | Status: AC
  Filled 2017-07-16: qty 1

## 2017-07-16 MED ORDER — ONDANSETRON HCL 4 MG/2ML IJ SOLN
INTRAMUSCULAR | Status: DC | PRN
  Administered 2017-07-16: 4 mg via INTRAVENOUS

## 2017-07-16 MED ORDER — SUCCINYLCHOLINE CHLORIDE 20 MG/ML IJ SOLN
INTRAMUSCULAR | Status: DC | PRN
  Administered 2017-07-16: 100 mg via INTRAVENOUS

## 2017-07-16 MED ORDER — PHENYLEPHRINE HCL 10 MG/ML IJ SOLN
INTRAMUSCULAR | Status: AC
  Filled 2017-07-16: qty 1

## 2017-07-16 MED ORDER — BACITRACIN ZINC 500 UNIT/GM EX OINT
TOPICAL_OINTMENT | CUTANEOUS | Status: AC
  Filled 2017-07-16: qty 28.35

## 2017-07-16 MED ORDER — FENTANYL CITRATE (PF) 100 MCG/2ML IJ SOLN
INTRAMUSCULAR | Status: AC
  Filled 2017-07-16: qty 2

## 2017-07-16 MED ORDER — PHENYLEPHRINE HCL 10 MG/ML IJ SOLN
INTRAMUSCULAR | Status: DC | PRN
  Administered 2017-07-16: 100 ug via INTRAVENOUS

## 2017-07-16 MED ORDER — FENTANYL CITRATE (PF) 100 MCG/2ML IJ SOLN
INTRAMUSCULAR | Status: DC | PRN
  Administered 2017-07-16 (×2): 50 ug via INTRAVENOUS
  Administered 2017-07-16: 100 ug via INTRAVENOUS

## 2017-07-16 MED ORDER — SUGAMMADEX SODIUM 200 MG/2ML IV SOLN
INTRAVENOUS | Status: DC | PRN
  Administered 2017-07-16: 150 mg via INTRAVENOUS

## 2017-07-16 MED ORDER — SUCCINYLCHOLINE CHLORIDE 20 MG/ML IJ SOLN
INTRAMUSCULAR | Status: AC
  Filled 2017-07-16: qty 1

## 2017-07-16 MED ORDER — ONDANSETRON HCL 4 MG/2ML IJ SOLN: 4.0000 mg | Freq: Once | INTRAMUSCULAR | Status: DC | PRN

## 2017-07-16 MED ORDER — DOCUSATE SODIUM 100 MG PO CAPS: 100.0000 mg | ORAL_CAPSULE | Freq: Two times a day (BID) | ORAL | 0 refills | Status: DC

## 2017-07-16 MED ORDER — ONDANSETRON HCL 4 MG/2ML IJ SOLN
INTRAMUSCULAR | Status: AC
  Filled 2017-07-16: qty 2

## 2017-07-16 MED ORDER — CIPROFLOXACIN HCL 500 MG PO TABS: 500.0000 mg | ORAL_TABLET | Freq: Two times a day (BID) | ORAL | 0 refills | Status: DC

## 2017-07-16 MED ORDER — SUGAMMADEX SODIUM 200 MG/2ML IV SOLN
INTRAVENOUS | Status: AC
  Filled 2017-07-16: qty 2

## 2017-07-16 MED ORDER — CIPROFLOXACIN IN D5W 400 MG/200ML IV SOLN
INTRAVENOUS | Status: AC
  Filled 2017-07-16: qty 200

## 2017-07-16 MED ORDER — FENTANYL CITRATE (PF) 100 MCG/2ML IJ SOLN: 25.0000 ug | INTRAMUSCULAR | Status: DC | PRN

## 2017-07-16 MED ORDER — FAMOTIDINE 20 MG PO TABS
ORAL_TABLET | ORAL | Status: AC
  Administered 2017-07-16: 20 mg via ORAL
  Filled 2017-07-16: qty 1

## 2017-07-16 MED ORDER — EPHEDRINE SULFATE 50 MG/ML IJ SOLN
INTRAMUSCULAR | Status: DC | PRN
  Administered 2017-07-16 (×3): 10 mg via INTRAVENOUS

## 2017-07-16 MED ORDER — BACITRACIN 500 UNIT/GM EX OINT
TOPICAL_OINTMENT | CUTANEOUS | Status: DC | PRN
  Administered 2017-07-16: 1 via TOPICAL

## 2017-07-16 MED ORDER — LIDOCAINE HCL (CARDIAC) 20 MG/ML IV SOLN
INTRAVENOUS | Status: DC | PRN
  Administered 2017-07-16: 80 mg via INTRAVENOUS

## 2017-07-16 MED ORDER — ROCURONIUM BROMIDE 100 MG/10ML IV SOLN
INTRAVENOUS | Status: DC | PRN
  Administered 2017-07-16: 30 mg via INTRAVENOUS
  Administered 2017-07-16: 5 mg via INTRAVENOUS
  Administered 2017-07-16: 15 mg via INTRAVENOUS

## 2017-07-16 MED ORDER — PROPOFOL 10 MG/ML IV BOLUS
INTRAVENOUS | Status: DC | PRN
Start: 2017-07-16 — End: 2017-07-16
  Administered 2017-07-16: 150 mg via INTRAVENOUS

## 2017-07-16 MED ORDER — LIDOCAINE HCL (PF) 2 % IJ SOLN
INTRAMUSCULAR | Status: AC
  Filled 2017-07-16: qty 10

## 2017-07-16 MED ORDER — PROPOFOL 10 MG/ML IV BOLUS
INTRAVENOUS | Status: AC
  Filled 2017-07-16: qty 20

## 2017-07-16 MED ORDER — LACTATED RINGERS IV SOLN
INTRAVENOUS | Status: DC
  Administered 2017-07-16: 07:00:00 via INTRAVENOUS

## 2017-07-16 SURGICAL SUPPLY — 25 items
BAG URO DRAIN 2000ML W/SPOUT (MISCELLANEOUS) ×3 IMPLANT
BLADE CLIPPER SURG (BLADE) ×3 IMPLANT
CATH FOL 2WAY LX 16X5 (CATHETERS) ×3 IMPLANT
DRAPE INCISE 23X17 IOBAN STRL (DRAPES) ×2
DRAPE INCISE IOBAN 23X17 STRL (DRAPES) ×1 IMPLANT
DRAPE SHEET LG 3/4 BI-LAMINATE (DRAPES) ×3 IMPLANT
DRAPE TABLE BACK 80X90 (DRAPES) ×3 IMPLANT
DRAPE UNDER BUTTOCK W/FLU (DRAPES) ×3 IMPLANT
DRSG TELFA 3X8 NADH (GAUZE/BANDAGES/DRESSINGS) ×3 IMPLANT
GLOVE BIO SURGEON STRL SZ 6.5 (GLOVE) ×4 IMPLANT
GLOVE BIO SURGEON STRL SZ7.5 (GLOVE) ×6 IMPLANT
GLOVE BIO SURGEONS STRL SZ 6.5 (GLOVE) ×2
GOWN STRL REUS W/ TWL LRG LVL3 (GOWN DISPOSABLE) ×2 IMPLANT
GOWN STRL REUS W/ TWL XL LVL3 (GOWN DISPOSABLE) ×1 IMPLANT
GOWN STRL REUS W/TWL LRG LVL3 (GOWN DISPOSABLE) ×4
GOWN STRL REUS W/TWL XL LVL3 (GOWN DISPOSABLE) ×2
IV NS 1000ML (IV SOLUTION) ×2
IV NS 1000ML BAXH (IV SOLUTION) ×1 IMPLANT
KIT RM TURNOVER CYSTO AR (KITS) ×3 IMPLANT
PACK CYSTO AR (MISCELLANEOUS) ×3 IMPLANT
SET CYSTO W/LG BORE CLAMP LF (SET/KITS/TRAYS/PACK) ×3 IMPLANT
SURGILUBE 2OZ TUBE FLIPTOP (MISCELLANEOUS) ×3 IMPLANT
SYRINGE 10CC LL (SYRINGE) ×3 IMPLANT
SYRINGE IRR TOOMEY STRL 70CC (SYRINGE) IMPLANT
WATER STERILE IRR 1000ML POUR (IV SOLUTION) ×3 IMPLANT

## 2017-07-16 NOTE — Op Note (Signed)
Preoperative diagnosis: Adenocarcinoma of the prostate   Postoperative diagnosis: Same   Procedure: I-125 prostate seed implantation, cystoscopy  Surgeon: Hollice Espy M.D.   Radiation Oncologist:  Lavena Stanford, M.D.   Anesthesia: General  Drains: none  Complications: none  Indications: 71 yo M with Gleason 4+3 prostate cancer s/p IMRT scheduled for brachytherapy boose  Procedure: Patient was brought to operating suite and placement table in the supine position. At this time, a universal timeout protocol was performed, all team members were identified, Venodyne boots are placed, and he was administered IV Ancef in the preoperative period. He was placed in lithotomy position and prepped and draped in usual manner. Radiation oncology department placed a transrectal ultrasound probe anchoring stand/ grid and aligned with previous imaging from the volume study. Foley catheter was inserted without difficulty.  All needle passage was done with real-time transrectal ultrasound guidance in both the transverse and sagittal plains in order to achieve the desired preplanned position. A total of 18 needles were placed.  82 active seeds were implanted. The Foley catheter was removed and a rigid cystoscopy failed to show any seeds outside the prostate without evidence of trauma to the urethral, prostatic fossa, or bladder.  The bladder was drained.  A fluoroscopic image was then obtained showing excellent distrubution of the brachytherapy seeds.  Each seed was counted and counts were correct.    The patient was then repositioned in the supine position, reversed from anesthesia, and taken to the PACU in stable condition.

## 2017-07-16 NOTE — Transfer of Care (Signed)
Immediate Anesthesia Transfer of Care Note  Patient: Shane Stewart  Procedure(s) Performed: RADIOACTIVE SEED IMPLANT/BRACHYTHERAPY IMPLANT (N/A ) CYSTOSCOPY (N/A )  Patient Location: PACU  Anesthesia Type:General  Level of Consciousness: awake and responds to stimulation  Airway & Oxygen Therapy: Patient Spontanous Breathing and Patient connected to face mask oxygen  Post-op Assessment: Report given to RN and Post -op Vital signs reviewed and stable  Post vital signs: Reviewed and stable  Last Vitals:  Vitals:   07/16/17 0905 07/16/17 0907  BP: 117/60 117/60  Pulse: 82 80  Resp: 10   Temp: (!) 36.4 C   SpO2: 99% 100%    Last Pain:  Vitals:   07/16/17 0618  TempSrc: Oral         Complications: No apparent anesthesia complications

## 2017-07-16 NOTE — Progress Notes (Signed)
Radiation Oncology Follow up Note  Name: Shane Stewart   Date:   05/20/2017 MRN:  [id number] DOB: 19-Mar-1946    This 71 y.o. male presents to the  Hospital for I-125 interstitial implant for  STAGE IIB adenocarcinoma the prostate REFERRING PROVIDER: No ref. provider found  HPI: patient is a 71 year old male who presentedwith elevated PSA of 6.3. He underwent transrectal ultrasound-guided biopsy positive for Gleason 8 (4+4) in 1 core as well as Gleason 7 (4+3). Workup including CT scan showed no evidence of extracapsular extension or pelvic lymphadenopathy and bone scan was negative. He's been undergoing external beam radiation therapy both his prostate and pelvic nodes he is seen today for volume study and history and physical prior to his implant. He is doing well has some increased lower urinary tract symptoms otherwise tolerating treatment extremely well. He was  Admitted to outpatient surgery and underwent I-125 interstitial.  COMPLICATIONS OF TREATMENT: none  FOLLOW UP COMPLIANCE: keeps appointments   PHYSICAL EXAM:  BP 118/67   Pulse 74   Temp 97.8 F (36.6 C) (Temporal)   Resp 16   Ht 5\' 8"  (1.727 m)   Wt 162 lb (73.5 kg)   SpO2 100%   BMI 24.63 kg/m  Well-developed well-nourished patient in NAD. HEENT reveals PERLA, EOMI, discs not visualized.  Oral cavity is clear. No oral mucosal lesions are identified. Neck is clear without evidence of cervical or supraclavicular adenopathy. Lungs are clear to A&P. Cardiac examination is essentially unremarkable with regular rate and rhythm without murmur rub or thrill. Abdomen is benign with no organomegaly or masses noted. Motor sensory and DTR levels are equal and symmetric in the upper and lower extremities. Cranial nerves II through XII are grossly intact. Proprioception is intact. No peripheral adenopathy or edema is identified. No motor or sensory levels are noted. Crude visual fields are within normal range.  RADIOLOGY RESULTS:   Ultrasound guidance use for seed placement as well as plain film use for QA  PLAN: patient was taken to the operating room and general anesthesia was administered. Legs were immobilized in stirrups and patient was positioned in the exact same proportions as original volume study. Patient was prepped and Foley catheter was placed. Ultrasound guidance identified the prostate and recreated the original set up as per treatment planning volume study. 22 needles were placed under ultrasound guidance with PVCs delivered to the prostate volume. After completion of procedure cystoscopy was performed by urology and no evidence of seeds in the bladder were noted. Patient tolerated the procedure extremely well. Initial plain film as doublecheck identified 83 seeds in the prostate. Patient has followup appointment in one month for CT scan for quality assurance will be performed.     [name]., MD

## 2017-07-16 NOTE — Anesthesia Procedure Notes (Signed)
Procedure Name: Intubation Performed by: Lance Muss Pre-anesthesia Checklist: Patient identified, Patient being monitored, Timeout performed, Emergency Drugs available and Suction available Patient Re-evaluated:Patient Re-evaluated prior to induction Oxygen Delivery Method: Circle system utilized Preoxygenation: Pre-oxygenation with 100% oxygen Induction Type: IV induction Ventilation: Mask ventilation without difficulty Laryngoscope Size: Mac and 3 Grade View: Grade II Tube type: Oral Tube size: 7.5 mm Number of attempts: 1 Airway Equipment and Method: Stylet Placement Confirmation: ETT inserted through vocal cords under direct vision,  positive ETCO2 and breath sounds checked- equal and bilateral Secured at: 23 cm Tube secured with: Tape Dental Injury: Teeth and Oropharynx as per pre-operative assessment  Difficulty Due To: Difficult Airway- due to anterior larynx Future Recommendations: Recommend- induction with short-acting agent, and alternative techniques readily available

## 2017-07-16 NOTE — Anesthesia Preprocedure Evaluation (Signed)
Anesthesia Evaluation  Patient identified by MRN, date of birth, ID band Patient awake    Reviewed: Allergy & Precautions, NPO status , Patient's Chart, lab work & pertinent test results  Airway Mallampati: II       Dental  (+) Teeth Intact   Pulmonary neg pulmonary ROS,    breath sounds clear to auscultation       Cardiovascular Exercise Tolerance: Good  Rhythm:Regular Rate:Normal     Neuro/Psych negative neurological ROS  negative psych ROS   GI/Hepatic negative GI ROS, Neg liver ROS,   Endo/Other  negative endocrine ROS  Renal/GU negative Renal ROS  negative genitourinary   Musculoskeletal negative musculoskeletal ROS (+)   Abdominal Normal abdominal exam  (+)   Peds negative pediatric ROS (+)  Hematology negative hematology ROS (+)   Anesthesia Other Findings   Reproductive/Obstetrics                             Anesthesia Physical Anesthesia Plan  ASA: I  Anesthesia Plan: General   Post-op Pain Management:    Induction: Intravenous  PONV Risk Score and Plan:   Airway Management Planned: LMA and Oral ETT  Additional Equipment:   Intra-op Plan:   Post-operative Plan: Extubation in OR  Informed Consent: I have reviewed the patients History and Physical, chart, labs and discussed the procedure including the risks, benefits and alternatives for the proposed anesthesia with the patient or authorized representative who has indicated his/her understanding and acceptance.     Plan Discussed with: CRNA  Anesthesia Plan Comments:         Anesthesia Quick Evaluation

## 2017-07-16 NOTE — H&P (View-Only) (Signed)
NEW PATIENT EVALUATION  Name: Shane Stewart  MRN: [id number]  Date:   06/18/2017     DOB: July 24, 1946   This 71 y.o. male patient presents to the clinic for History and physical regarding volume study prior to I-125 interstitial implant for stage IIB prostate cancer  REFERRING PHYSICIAN: Tonia Ghent, MD  CHIEF COMPLAINT:  Chief Complaint  Patient presents with  . Prostate Cancer    follow up Volume Study    DIAGNOSIS: The encounter diagnosis was Malignant neoplasm of prostate ([place]).   PREVIOUS INVESTIGATIONS:  Previous notes reviewed  HPI: patient is a 71 year old male who presentedwith elevated PSA of 6.3. He underwent transrectal ultrasound-guided biopsy positive for Gleason 8 (4+4) in 1 core as well as Gleason 7 (4+3). Workup including CT scan showed no evidence of extracapsular extension or pelvic lymphadenopathy and bone scan was negative. He's been undergoing external beam radiation therapy both his prostate and pelvic nodes he is seen today for volume study and history and physical prior to his implant. He is doing well has some increased lower urinary tract symptoms otherwise tolerating treatment extremely well  PLANNED TREATMENT REGIMEN: I-125 interstitial implant for boost  PAST MEDICAL HISTORY:  has a past medical history of Cancer ([place]); Diverticulosis of colon (01/1999); History of CT scan (04/1985); Hyperlipidemia; Right knee pain; and Rosacea.    PAST SURGICAL HISTORY:  Past Surgical History:  Procedure Laterality Date  . LUMBAR SPINE SURGERY  1986   LS ruptured dis repair  . UMBILICAL HERNIA REPAIR  10/05/2005    FAMILY HISTORY: family history includes Alzheimer's disease in his mother; Cancer in his father; Colon cancer (age of onset: 27) in his father; Dementia in his mother; Diabetes in his father and sister; Heart disease in his father and sister; Hypertension in his sister.  SOCIAL HISTORY:  reports that he has never smoked. He has never used smokeless  tobacco. He reports that he does not drink alcohol or use drugs.  ALLERGIES: Patient has no known allergies.  MEDICATIONS:  Current Outpatient Prescriptions  Medication Sig Dispense Refill  . Cholecalciferol 2000 units TABS Take 2,000 Units by mouth daily.    Marland Kitchen loperamide (IMODIUM) 2 MG capsule Take 2 mg by mouth as needed for diarrhea or loose stools.    . metroNIDAZOLE (METROGEL) 0.75 % gel Apply topically daily. Apply to affected area daily as needed (Patient taking differently: Apply 1 application topically daily. Apply to affected area daily as needed) 45 g 3  . Multiple Vitamin (MULTIVITAMIN) tablet Take 1 tablet by mouth daily.    Marland Kitchen triamcinolone cream (KENALOG) 0.5 % Apply 1 application topically 2 (two) times daily as needed. (Patient not taking: Reported on 04/18/2017) 30 g 1  . vitamin B-12 (CYANOCOBALAMIN) 1000 MCG tablet Take 1,000 mcg by mouth daily.     No current facility-administered medications for this encounter.     ECOG PERFORMANCE STATUS:  0 - Asymptomatic  REVIEW OF SYSTEMS:  Patient denies any weight loss, fatigue, weakness, fever, chills or night sweats. Patient denies any loss of vision, blurred vision. Patient denies any ringing  of the ears or hearing loss. No irregular heartbeat. Patient denies heart murmur or history of fainting. Patient denies any chest pain or pain radiating to her upper extremities. Patient denies any shortness of breath, difficulty breathing at night, cough or hemoptysis. Patient denies any swelling in the lower legs. Patient denies any nausea vomiting, vomiting of blood, or coffee ground material in the vomitus. Patient denies  any stomach pain. Patient states has had normal bowel movements no significant constipation or diarrhea. Patient denies any dysuria, hematuria or significant nocturia. Patient denies any problems walking, swelling in the joints or loss of balance. Patient denies any skin changes, loss of hair or loss of weight. Patient  denies any excessive worrying or anxiety or significant depression. Patient denies any problems with insomnia. Patient denies excessive thirst, polyuria, polydipsia. Patient denies any swollen glands, patient denies easy bruising or easy bleeding. Patient denies any recent infections, allergies or URI. Patient "s visual fields have not changed significantly in recent time.    PHYSICAL EXAM: BP 128/77   Pulse 66   Temp (!) 96.8 F (36 C)   Wt 162 lb 13 oz (73.9 kg)   BMI 24.76 kg/m  Well-developed well-nourished patient in NAD. HEENT reveals PERLA, EOMI, discs not visualized.  Oral cavity is clear. No oral mucosal lesions are identified. Neck is clear without evidence of cervical or supraclavicular adenopathy. Lungs are clear to A&P. Cardiac examination is essentially unremarkable with regular rate and rhythm without murmur rub or thrill. Abdomen is benign with no organomegaly or masses noted. Motor sensory and DTR levels are equal and symmetric in the upper and lower extremities. Cranial nerves II through XII are grossly intact. Proprioception is intact. No peripheral adenopathy or edema is identified. No motor or sensory levels are noted. Crude visual fields are within normal range.  LABORATORY DATA: labs to be performed    RADIOLOGY RESULTS:ultrasound use today for volume study   IMPRESSION: present time patient has a stageto be Gleason 8 adenocarcinoma the prostate undergoing pelvic radiation as well as prosthetic radiation and seen today for volume study in anticipation of I-125 interstitial implant for boost  PLAN: present time patient is clear to proceed with implant. We have performed volume study today which went successfully. Treatment planning will be performed on the volume study todayrisks and benefits and including radiation safety precautions were all discussed in detail with the patient today he compresses her treatment plan well.  I would like to take this opportunity to thank  you for allowing me to participate in the care of your patient.[name]., MD

## 2017-07-16 NOTE — Interval H&P Note (Signed)
History and Physical Interval Note:  07/16/2017 7:21 AM  Shane Stewart  has presented today for surgery, with the diagnosis of PROSTATE CANCER  The various methods of treatment have been discussed with the patient and family. After consideration of risks, benefits and other options for treatment, the patient has consented to  Procedure(s): RADIOACTIVE SEED IMPLANT/BRACHYTHERAPY IMPLANT (N/A) as a surgical intervention .  The patient's history has been reviewed, patient examined, no change in status, stable for surgery.  I have reviewed the patient's chart and labs.  Questions were answered to the patient's satisfaction.    RRR CTAB  Hollice Espy

## 2017-07-16 NOTE — Discharge Instructions (Signed)
Brachytherapy for Prostate Cancer, Care After °Refer to this sheet in the next few weeks. These instructions provide you with information on caring for yourself after your procedure. Your health care provider may also give you more specific instructions. Your treatment has been planned according to current medical practices, but problems sometimes occur. Call your health care provider if you have any problems or questions after your procedure. °What can I expect after the procedure? °The area behind the scrotum will probably be tender and bruised. For a short period of time you may have: °· Difficulty passing urine. You may need a catheter for a few days to a month. °· Blood in the urine or semen. °· A feeling of constipation because of prostate swelling. °· Frequent feeling of an urgent need to urinate. ° °For a long period of time you may have: °· Inflammation of the rectum. This happens in about 2% of people who have the procedure. °· Erection problems. These vary with age and occur in about 15-40% of men. °· Difficulty urinating. This is caused by scarring in the urethra. °· Diarrhea. ° °Follow these instructions at home: °· Take medicines only as directed by your health care provider. °· You will probably have a catheter in your bladder for several days. You will have blood in the urine bag and should drink a lot of fluids to keep it a light red color. °· Keep all follow-up visits as directed by your health care provider. If you have a catheter, it will be removed during one of these visits. °· Try not to sit directly on the area behind the scrotum. A soft cushion can decrease the discomfort. Ice packs may also be helpful for the discomfort. Do not put ice directly on the skin. °· Shower and wash the area behind the scrotum gently. Do not sit in a tub. °· If you have had the brachytherapy that uses the seeds, limit your close contact with children and pregnant women for 2 months because of the radiation still  in the prostate. After that period of time, the levels drop off quickly. °Get help right away if: °· You have a fever. °· You have chills. °· You have shortness of breath. °· You have chest pain. °· You have thick blood, like tomato juice, in the urine bag. °· Your catheter is blocked so urine cannot get into the bag. Your bladder area or lower abdomen may be swollen. °· There is excessive bleeding from your rectum. It is normal to have a little blood mixed with your stool. °· There is severe discomfort in the treated area that does not go away with pain medicine. °· You have abdominal discomfort. °· You have severe nausea or vomiting. °· You develop any new or unusual symptoms. °This information is not intended to replace advice given to you by your health care provider. Make sure you discuss any questions you have with your health care provider. °Document Released: 10/13/2010 Document Revised: 02/22/2016 Document Reviewed: 03/03/2013 °Elsevier Interactive Patient Education © 2017 [employer] ° ° ° °AMBULATORY SURGERY  °DISCHARGE INSTRUCTIONS ° ° °1) The drugs that you were given will stay in your system until tomorrow so for the next 24 hours you should not: ° °A) Drive an automobile °B) Make any legal decisions °C) Drink any alcoholic beverage ° ° °2) You may resume regular meals tomorrow.  Today it is better to start with liquids and gradually work up to solid foods. ° °You may eat anything   you prefer, but it is better to start with liquids, then soup and crackers, and gradually work up to solid foods. ° ° °3) Please notify your doctor immediately if you have any unusual bleeding, trouble breathing, redness and pain at the surgery site, drainage, fever, or pain not relieved by medication. ° ° ° °4) Additional Instructions: ° ° ° ° ° ° ° °Please contact your physician with any problems or Same Day Surgery at [phone], Monday through Friday 6 am to 4 pm, or [place] [facility] [place] Main number at  [phone]. °

## 2017-07-16 NOTE — Anesthesia Post-op Follow-up Note (Signed)
Anesthesia QCDR form completed.        

## 2017-07-16 NOTE — Anesthesia Postprocedure Evaluation (Signed)
Anesthesia Post Note  Patient: Shane Stewart  Procedure(s) Performed: RADIOACTIVE SEED IMPLANT/BRACHYTHERAPY IMPLANT (N/A ) CYSTOSCOPY (N/A )  Patient location during evaluation: PACU Anesthesia Type: General Level of consciousness: awake Pain management: pain level controlled Vital Signs Assessment: post-procedure vital signs reviewed and stable Respiratory status: spontaneous breathing Cardiovascular status: stable Anesthetic complications: no     Last Vitals:  Vitals:   07/16/17 0935 07/16/17 0957  BP: 120/70 117/69  Pulse: 81 70  Resp: 19 16  Temp: 36.7 C 36.6 C  SpO2: 99% 99%    Last Pain:  Vitals:   07/16/17 0957  TempSrc: Temporal  PainSc: 1                  VAN STAVEREN,Rayah Fines

## 2017-07-22 ENCOUNTER — Encounter: Payer: Self-pay | Admitting: Urology

## 2017-07-22 DIAGNOSIS — Z01810 Encounter for preprocedural cardiovascular examination: Secondary | ICD-10-CM | POA: Diagnosis not present

## 2017-08-12 ENCOUNTER — Ambulatory Visit
Admission: RE | Admit: 2017-08-12 | Discharge: 2017-08-12 | Disposition: A | Discharge status: Home / Self Care | Payer: PPO | Source: Ambulatory Visit | Attending: Radiation Oncology | Admitting: Radiation Oncology

## 2017-08-12 ENCOUNTER — Encounter: Payer: Self-pay | Admitting: Radiation Oncology

## 2017-08-12 ENCOUNTER — Other Ambulatory Visit: Payer: Self-pay

## 2017-08-12 VITALS — BP 135/76 | HR 67 | Temp 97.0°F | Wt 166.0 lb

## 2017-08-12 DIAGNOSIS — Z510 Encounter for antineoplastic radiation therapy: Secondary | ICD-10-CM | POA: Diagnosis not present

## 2017-08-12 DIAGNOSIS — C61 Malignant neoplasm of prostate: Secondary | ICD-10-CM | POA: Diagnosis not present

## 2017-08-12 DIAGNOSIS — R351 Nocturia: Secondary | ICD-10-CM | POA: Diagnosis not present

## 2017-08-12 NOTE — Progress Notes (Signed)
Radiation Oncology Follow up Note  Name: Shane Stewart   Date:   08/12/2017 MRN:  [id number] DOB: [birth year]    This 71 y.o. male presents to the clinic today for one-month follow-up status post I-125 interstitial implant as well as external beam radiation therapy for stage IIb adenocarcinoma the prostate.  REFERRING PROVIDER: Tonia Ghent, MD  HPI: Patient is a 71 year old male now out 1 month having completed both external beam radiation therapy as well as I-125 interstitial implant for boost for Gleason 7 (4+3) adenocarcinoma the prostate with 1 core Gleason 8 (4+4). He is seen today in routine follow-up is doing well. Only complaint is some nocturia 3-4. He specifically denies diarrhea fatigue or any other GU complaints..  COMPLICATIONS OF TREATMENT: none  FOLLOW UP COMPLIANCE: keeps appointments   PHYSICAL EXAM:  BP 135/76   Pulse 67   Temp (!) 97 F (36.1 C)   Wt 166 lb 0.1 oz (75.3 kg)   BMI 25.24 kg/m  On rectal exam rectal sphincter tone is good. Prostate is smooth contracted without evidence of nodularity or mass. Sulcus is preserved bilaterally. No discrete nodularity is identified. No other rectal abnormalities are noted. Well-developed well-nourished patient in NAD. HEENT reveals PERLA, EOMI, discs not visualized.  Oral cavity is clear. No oral mucosal lesions are identified. Neck is clear without evidence of cervical or supraclavicular adenopathy. Lungs are clear to A&P. Cardiac examination is essentially unremarkable with regular rate and rhythm without murmur rub or thrill. Abdomen is benign with no organomegaly or masses noted. Motor sensory and DTR levels are equal and symmetric in the upper and lower extremities. Cranial nerves II through XII are grossly intact. Proprioception is intact. No peripheral adenopathy or edema is identified. No motor or sensory levels are noted. Crude visual fields are within normal range.  RADIOLOGY RESULTS: CT scan for quality  assurance performed  PLAN: At this time were analyzing his CT scan for quality assurance although preliminarily report looks like excellent placement resources. Otherwise I'm please was overall progress. I've asked to see him back in 3 months for follow-up at which time we will obtain a PSA. Patient knows to call sooner with any concerns.  I would like to take this opportunity to thank you for allowing me to participate in the care of your patient.[name]., MD

## 2017-08-13 DIAGNOSIS — C61 Malignant neoplasm of prostate: Secondary | ICD-10-CM | POA: Diagnosis not present

## 2017-08-13 DIAGNOSIS — Z510 Encounter for antineoplastic radiation therapy: Secondary | ICD-10-CM | POA: Diagnosis not present

## 2017-08-29 ENCOUNTER — Encounter: Payer: Self-pay | Admitting: Urology

## 2017-08-29 ENCOUNTER — Ambulatory Visit: Payer: PPO | Admitting: Urology

## 2017-08-29 VITALS — BP 134/75 | HR 76 | Ht 68.0 in | Wt 160.0 lb

## 2017-08-29 DIAGNOSIS — C61 Malignant neoplasm of prostate: Secondary | ICD-10-CM

## 2017-08-29 NOTE — Progress Notes (Signed)
08/29/2017 3:05 PM   Shane Stewart 08/05/1946 [id number]  Referring provider: Tonia Ghent, MD [address] [name], [place] [address]  Chief Complaint  Patient presents with  . Prostate Cancer    6wk follow up    HPI: The patient is a 71 year old gentleman presents today to discuss his metastatic work up results for recently diagnosed prostate cancer. Prostate biopsy was performed for a PSA of 6.3 and a 1 cm nodule at the right apex. His biopsy showed Gleason 4+4 = 8 disease in 4 of 12 cores. He had negative metastatic work up.  He underwent brachytherapy as well as external beam radiation for his disease which he completed in October 2018.  The plan was also to have him undergo adjuvant androgen deprivation therapy.  He received a 51-month Lupron shot in May 07, 2017.  This was his only shot.  He will need to be on this medication for 2-3 years.  Pathology: (Right apex w/ nodule on DRE) Gleason 4+4 = 8 in4% of the left lateral apex Gleason 4+3 = 7 in40% and 27% of the left lateral base and left medial mid Gleason 3+3 = 6 prostate cancer in7% of the left medial apex.   He does have some urinary frequency and hesitancy.  He denies dysuria.  His urinary symptoms improved with Flomax.  He did not have these for radiation.  PMH: Past Medical History:  Diagnosis Date  . Cancer [facility])    prostate  . Diverticulosis of colon 01/1999  . History of CT scan 04/1985   L/S, H/P L5/S1  . Hyperlipidemia   . Right knee pain    better with hyaluronic acid  . Rosacea     Surgical History: Past Surgical History:  Procedure Laterality Date  . APPENDECTOMY    . CYSTOSCOPY N/A 07/16/2017   Procedure: CYSTOSCOPY;  Surgeon: Hollice Espy, MD;  Location: [facility] ORS;  Service: Urology;  Laterality: N/A;  . LUMBAR SPINE SURGERY  1986   LS ruptured dis repair  . RADIOACTIVE SEED IMPLANT N/A 07/16/2017   Procedure: RADIOACTIVE SEED IMPLANT/BRACHYTHERAPY IMPLANT;  Surgeon:  Hollice Espy, MD;  Location: [facility] ORS;  Service: Urology;  Laterality: N/A;  . TONSILLECTOMY    . UMBILICAL HERNIA REPAIR  10/05/2005    Home Medications:  Allergies as of 08/29/2017   No Known Allergies     Medication List        Accurate as of 08/29/17  3:05 PM. Always use your most recent med list.          Cholecalciferol 2000 units Tabs Take 2,000 Units by mouth daily.   metroNIDAZOLE 0.75 % gel Commonly known as:  METROGEL Apply topically daily. Apply to affected area daily as needed   multivitamin tablet Take 1 tablet by mouth daily.   tamsulosin 0.4 MG Caps capsule Commonly known as:  FLOMAX Take 1 capsule (0.4 mg total) by mouth daily after supper.   vitamin B-12 1000 MCG tablet Commonly known as:  CYANOCOBALAMIN Take 1,000 mcg by mouth daily.       Allergies: No Known Allergies  Family History: Family History  Problem Relation Age of Onset  . Alzheimer's disease Mother   . Dementia Mother   . Cancer Father        colon  . Heart disease Father        CAD  . Diabetes Father   . Colon cancer Father 44  . Diabetes Sister   . Heart disease  Sister        MI CABG x 3  . Hypertension Sister   . Prostate cancer Neg Hx     Social History:  reports that  has never smoked. he has never used smokeless tobacco. He reports that he does not drink alcohol or use drugs.  ROS: UROLOGY Frequent Urination?: Yes Hard to postpone urination?: Yes Burning/pain with urination?: No Get up at night to urinate?: Yes Leakage of urine?: No Urine stream starts and stops?: Yes Trouble starting stream?: Yes Do you have to strain to urinate?: No Blood in urine?: No Urinary tract infection?: No Sexually transmitted disease?: No Injury to kidneys or bladder?: No Painful intercourse?: No Weak stream?: Yes Erection problems?: No Penile pain?: No  Gastrointestinal Nausea?: No Vomiting?: No Indigestion/heartburn?: No Diarrhea?: No Constipation?:  No  Constitutional Fever: No Night sweats?: No Weight loss?: No Fatigue?: No  Skin Skin rash/lesions?: No Itching?: No  Eyes Blurred vision?: No Double vision?: No  Ears/Nose/Throat Sore throat?: No Sinus problems?: No  Hematologic/Lymphatic Swollen glands?: No Easy bruising?: No  Cardiovascular Leg swelling?: No Chest pain?: No  Respiratory Cough?: No Shortness of breath?: No  Endocrine Excessive thirst?: No  Musculoskeletal Back pain?: No Joint pain?: No  Neurological Headaches?: No Dizziness?: No  Psychologic Depression?: No Anxiety?: No  Physical Exam: BP 134/75   Pulse 76   Ht 5\' 8"  (1.727 m)   Wt 160 lb (72.6 kg)   BMI 24.33 kg/m   Constitutional:  Alert and oriented, No acute distress. HEENT: [place] AT, moist mucus membranes.  Trachea midline, no masses. Cardiovascular: No clubbing, cyanosis, or edema. Respiratory: Normal respiratory effort, no increased work of breathing. GI: Abdomen is soft, nontender, nondistended, no abdominal masses GU: No CVA tenderness.  Skin: No rashes, bruises or suspicious lesions. Lymph: No cervical or inguinal adenopathy. Neurologic: Grossly intact, no focal deficits, moving all 4 extremities. Psychiatric: Normal mood and affect.  Laboratory Data: Lab Results  Component Value Date   WBC 3.8 07/05/2017   HGB 15.1 07/05/2017   HCT 43.4 07/05/2017   MCV 96.4 07/05/2017   PLT 139 (L) 07/05/2017    Lab Results  Component Value Date   CREATININE 0.80 04/17/2017    Lab Results  Component Value Date   PSA 6.30 (H) 01/07/2017   PSA 11.06 (H) 12/10/2016   PSA 1.57 11/02/2010    No results found for: TESTOSTERONE  No results found for: HGBA1C  Urinalysis    Component Value Date/Time   BILIRUBINUR Neg 01/07/2017 1033   PROTEINUR Neg 01/07/2017 1033   UROBILINOGEN 0.2 01/07/2017 1033   NITRITE Neg 01/07/2017 1033   LEUKOCYTESUR Negative 01/07/2017 1033     Assessment & Plan:    1.  High risk  prostate cancer The patient is doing well for approximately 1 month since completing radiation therapy.  He is scheduled to see Dr. Donella Stade in a few months for his PSA check.  I will see him back in 6 months with a PSA prior, so he does not have duplicate visits to check his PSA.  He is due for Lupron which he will continue for 2-3 years.  He would like a 35-month shot.  He however is a few days early for this.  We will arrange for him to have a 39-month Lupron shot as a nurse visit and week or so.  He will need a repeat Lupron shot at his next follow-up visit.  Return in about 6 months (around 02/27/2018) for PSA  prior. Also needs NV for 6 month lupron after 09/06/17.  Nickie Retort, MD  [facility] [address], [place], [place] [address] [phone]

## 2017-09-11 ENCOUNTER — Other Ambulatory Visit: Payer: Self-pay | Admitting: *Deleted

## 2017-09-11 DIAGNOSIS — C61 Malignant neoplasm of prostate: Secondary | ICD-10-CM

## 2017-09-12 ENCOUNTER — Ambulatory Visit (INDEPENDENT_AMBULATORY_CARE_PROVIDER_SITE_OTHER): Payer: PPO

## 2017-09-12 DIAGNOSIS — C61 Malignant neoplasm of prostate: Secondary | ICD-10-CM | POA: Diagnosis not present

## 2017-09-12 MED ORDER — LEUPROLIDE ACETATE (6 MONTH) 45 MG IM KIT
45.0000 mg | PACK | Freq: Once | INTRAMUSCULAR | Status: AC
  Administered 2017-09-12: 45 mg via INTRAMUSCULAR

## 2017-09-12 NOTE — Progress Notes (Signed)
IM Injection  Patient is present today for an IM Injection for treatment of prostate cancer. Drug: Lupron Dose:45mg  Location:left outter quadrant Lot: [id number] Exp:11/09/2019 Patient tolerated well, no complications were noted  Preformed by: Toniann Fail, LPN   Additional notes/ Follow up: 6 mo

## 2017-11-06 ENCOUNTER — Telehealth: Payer: Self-pay | Admitting: Family Medicine

## 2017-11-06 ENCOUNTER — Ambulatory Visit: Payer: Self-pay | Admitting: *Deleted

## 2017-11-06 NOTE — Telephone Encounter (Signed)
Patient is calling to report 3 episodes of pain in the left side- almost to the back. Patient states he felt intestinal pressure with the pain. The first episode was Sunday and the pain was severe. He has had 2 episodes since- with the pain not quit as intense- but lasting a little longer. Patient states there in no radiation or chest pain associated with the pain. Patient states he can pinpoint the area of the pain and it seems to be tender to palpitation. Appointment for evaluation. Reason for Disposition . Age > 60 years  Answer Assessment - Initial Assessment Questions 1. LOCATION: "Where does it hurt?"      Left side more towards back 2. RADIATION: "Does the pain shoot anywhere else?" (e.g., chest, back)     Some back pain- and pressure in intestines 3. ONSET: "When did the pain begin?" (Minutes, hours or days ago)      Sunday, Friday, yesterday 4. SUDDEN: "Gradual or sudden onset?"     Starts gradual and peaks 5. PATTERN "Does the pain come and go, or is it constant?"    - If constant: "Is it getting better, staying the same, or worsening?"      (Note: Constant means the pain never goes away completely; most serious pain is constant and it progresses)     - If intermittent: "How long does it last?" "Do you have pain now?"     (Note: Intermittent means the pain goes away completely between bouts)     Comes and goes-  Lasted longer last night- not as severe as first episode 6. SEVERITY: "How bad is the pain?"  (e.g., Scale 1-10; mild, moderate, or severe)    - MILD (1-3): doesn't interfere with normal activities, abdomen soft and not tender to touch     - MODERATE (4-7): interferes with normal activities or awakens from sleep, tender to touch     - SEVERE (8-10): excruciating pain, doubled over, unable to do any normal activities       Changing pain- builds and dissipates  7. RECURRENT SYMPTOM: "Have you ever had this type of abdominal pain before?" If so, ask: "When was the last time?"  and "What happened that time?"      no 8. CAUSE: "What do you think is causing the abdominal pain?"     unknown 9. RELIEVING/AGGRAVATING FACTORS: "What makes it better or worse?" (e.g., movement, antacids, bowel movement)     Nothing that is apparent- movement/Tylenol 10. OTHER SYMPTOMS: "Has there been any vomiting, diarrhea, constipation, or urine problems?"       no  Protocols used: ABDOMINAL PAIN - MALE-A-AH

## 2017-11-06 NOTE — Telephone Encounter (Signed)
Sent in basket message to let dr Damita Dunnings  Per protocol I transfer call to [facility] to have triage nurse  Talk to pt.  Spoke with Opal Sidles and she will triage pt   From Deidre Ala To Patient Appointment Schedule Request Pool Sent 11/05/2017 8:29 PM  Appointment Request From: Deidre Ala   With Provider: Elsie Stain, MD [facility] [place]   Preferred Date Range: 11/07/2017 - 11/07/2017   Preferred Times: Any time   Reason for visit: Request an Appointment   Comments:  I have had a sharp pain in the left side of my back around my lower thorasic. I have experienced this pain three times in the last week and a half. Simultaneously with the pain I have slight pain in my instestines area. The pain lasts for about an hour and a half. I had the pain on February 3, February 8 and Febuary 12.

## 2017-11-07 ENCOUNTER — Ambulatory Visit (INDEPENDENT_AMBULATORY_CARE_PROVIDER_SITE_OTHER)
Admission: RE | Admit: 2017-11-07 | Discharge: 2017-11-07 | Disposition: A | Discharge status: Home / Self Care | Payer: PPO | Source: Ambulatory Visit | Attending: Family Medicine | Admitting: Family Medicine

## 2017-11-07 ENCOUNTER — Ambulatory Visit: Payer: PPO | Admitting: Family Medicine

## 2017-11-07 ENCOUNTER — Encounter: Payer: Self-pay | Admitting: Family Medicine

## 2017-11-07 VITALS — BP 100/60 | HR 73 | Temp 98.6°F | Wt 164.5 lb

## 2017-11-07 DIAGNOSIS — R109 Unspecified abdominal pain: Secondary | ICD-10-CM

## 2017-11-07 DIAGNOSIS — R300 Dysuria: Secondary | ICD-10-CM | POA: Diagnosis not present

## 2017-11-07 LAB — POC URINALSYSI DIPSTICK (AUTOMATED)
BILIRUBIN UA: NEGATIVE
Glucose, UA: NEGATIVE
Ketones, UA: NEGATIVE
LEUKOCYTES UA: NEGATIVE
Nitrite, UA: NEGATIVE
PROTEIN UA: NEGATIVE
RBC UA: NEGATIVE
Spec Grav, UA: >=1.03 — AB (ref 1.010–1.025)
UROBILINOGEN UA: 0.2 U/dL
pH, UA: 6 (ref 5.0–8.0)

## 2017-11-07 IMAGING — DX DG ABDOMEN 1V
2 series · 2 of 2 positions shown · non-contrast
Comparison: Abdominal and pelvic CT scan April 17, 2017

CLINICAL DATA: Left flank pain

EXAM:
ABDOMEN - 1 VIEW

[abdomen kub (1 of 2)]
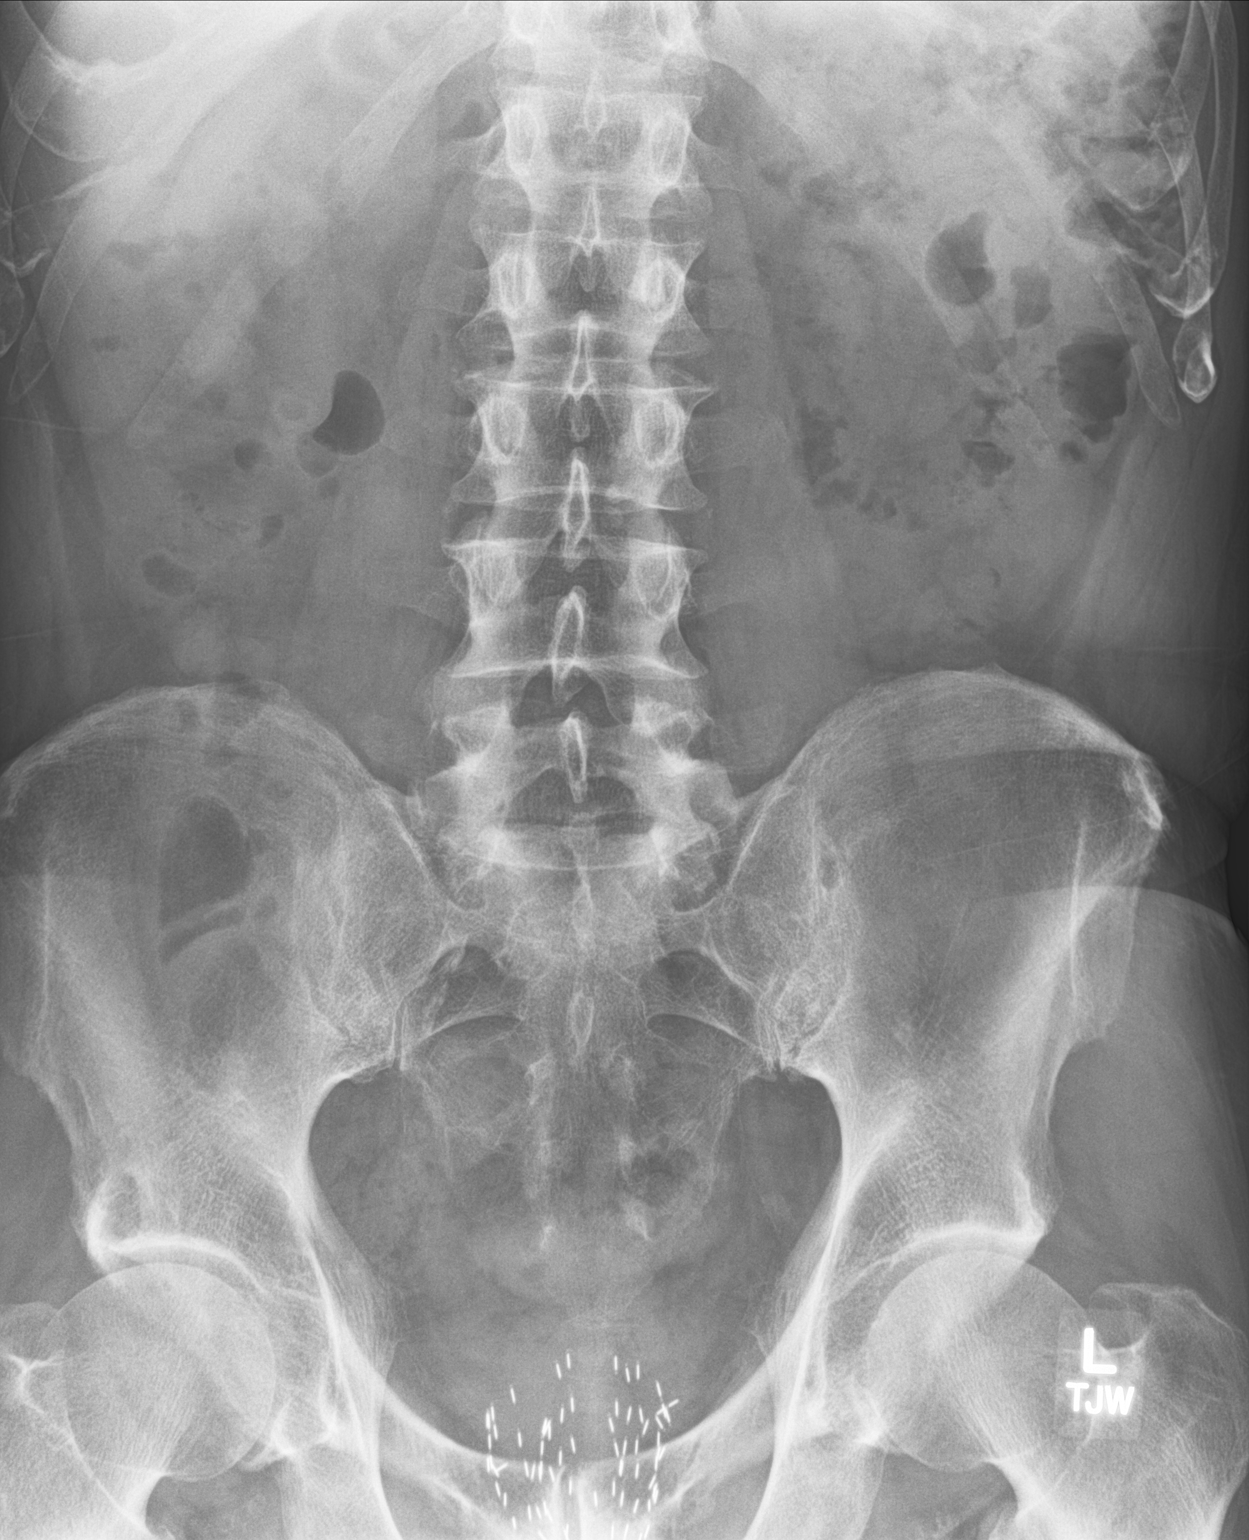

[abdomen kub (2 of 2)]
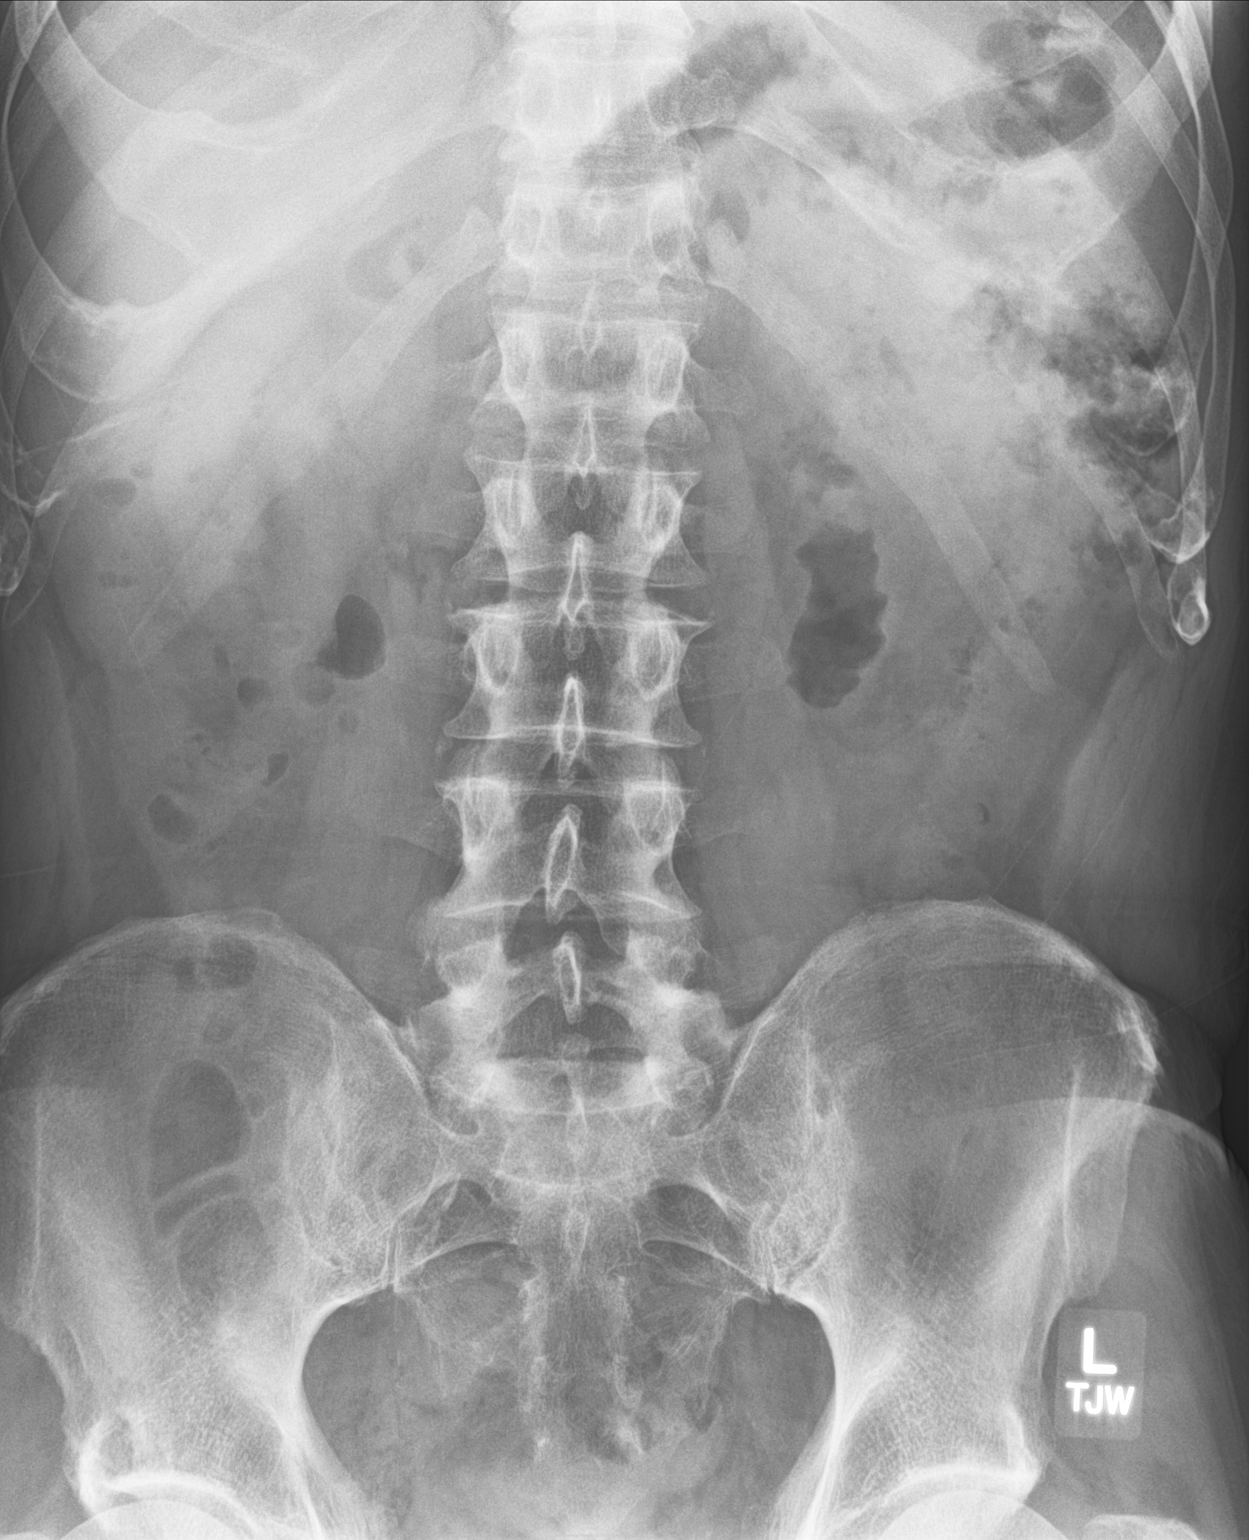

[2 of 2 positions shown; findings below may reference images not displayed]

FINDINGS: The bowel gas pattern is normal. Prostate bed seed implants are
present. No abnormal calcifications project over either kidney,
along the course of the ureters, or over the urinary bladder. The
bony structures exhibit no acute abnormalities.
IMPRESSION: No evidence of urinary tract stones nor other acute intra-abdominal
abnormality.

## 2017-11-07 NOTE — Progress Notes (Signed)
He has seen urology in the meantime, with f/u pending.    He had intentional weight loss with stable weight now.    Sharp L sided back pain than can radiate into the L lower abd, down to the groin.  Comes and goes, can last a few hours. No rash.  No R sided sx.  No FNAVD.  He has been exercising.  No dysuria or blood seen in urine.  Normal BMs.  No leg sx.  Significant sharp pain when it happens.  Zero pain between episodes.    Tylenol and walking may have helped with prev episodes.     PMH and SH reviewed  ROS: Per HPI unless specifically indicated in ROS section   Meds, vitals, and allergies reviewed.   GEN: nad, alert and oriented HEENT: mucous membranes moist NECK: supple w/o LA CV: rrr.  PULM: ctab, no inc wob ABD: soft, +bs EXT: no edema SKIN: no acute rash but minimal L mid back irritation, L of midline, from prev liniment application.  This does not look like an abscess or cellulitis.

## 2017-11-07 NOTE — Patient Instructions (Addendum)
Go to the lab on the way out.  We'll contact you with your lab report. Drink plenty of fluids.   Tylenol for pain if needed.   Take care.  Glad to see you.

## 2017-11-08 LAB — URINE CULTURE
MICRO NUMBER: 90200168
Result:: NO GROWTH
SPECIMEN QUALITY: ADEQUATE

## 2017-11-10 DIAGNOSIS — R10A2 Flank pain, left side: Secondary | ICD-10-CM | POA: Insufficient documentation

## 2017-11-10 DIAGNOSIS — R109 Unspecified abdominal pain: Secondary | ICD-10-CM | POA: Insufficient documentation

## 2017-11-10 NOTE — Assessment & Plan Note (Signed)
KUB was negative for stone.  He does not have hematuria on urinalysis.  Urine culture negative..  Rationale for initial testing discussed with patient.  See notes on labs.  He had benign exam at time of visit.  Unclear source.  We will get update on patient when we call him about his labs.

## 2017-11-18 ENCOUNTER — Inpatient Hospital Stay: Payer: PPO | Attending: Radiation Oncology

## 2017-11-18 DIAGNOSIS — C61 Malignant neoplasm of prostate: Secondary | ICD-10-CM

## 2017-11-18 LAB — PSA: PROSTATIC SPECIFIC ANTIGEN: 0.02 ng/mL (ref 0.00–4.00)

## 2017-11-25 ENCOUNTER — Other Ambulatory Visit: Payer: Self-pay

## 2017-11-25 ENCOUNTER — Other Ambulatory Visit: Payer: Self-pay | Admitting: *Deleted

## 2017-11-25 ENCOUNTER — Ambulatory Visit
Admission: RE | Admit: 2017-11-25 | Discharge: 2017-11-25 | Disposition: A | Discharge status: Home / Self Care | Payer: PPO | Source: Ambulatory Visit | Attending: Radiation Oncology | Admitting: Radiation Oncology

## 2017-11-25 ENCOUNTER — Encounter: Payer: Self-pay | Admitting: Radiation Oncology

## 2017-11-25 VITALS — BP 152/74 | HR 74 | Temp 95.5°F | Resp 20 | Wt 168.3 lb

## 2017-11-25 DIAGNOSIS — N529 Male erectile dysfunction, unspecified: Secondary | ICD-10-CM | POA: Insufficient documentation

## 2017-11-25 DIAGNOSIS — R3915 Urgency of urination: Secondary | ICD-10-CM | POA: Insufficient documentation

## 2017-11-25 DIAGNOSIS — R351 Nocturia: Secondary | ICD-10-CM | POA: Insufficient documentation

## 2017-11-25 DIAGNOSIS — Z923 Personal history of irradiation: Secondary | ICD-10-CM | POA: Diagnosis not present

## 2017-11-25 DIAGNOSIS — R350 Frequency of micturition: Secondary | ICD-10-CM | POA: Diagnosis not present

## 2017-11-25 DIAGNOSIS — C61 Malignant neoplasm of prostate: Secondary | ICD-10-CM | POA: Insufficient documentation

## 2017-11-25 NOTE — Progress Notes (Signed)
Radiation Oncology Follow up Note  Name: Shane Stewart   Date:   11/25/2017 MRN:  [id number] DOB: December 27, 1945    This 72 y.o. male presents to the clinic today for for your follow-up status whole pelvic radiation and boost I-125 interstitial implant for stage IIB adenocarcinoma the prostate.  REFERRING PROVIDER: Tonia Ghent, MD  HPI: Patient is a 72 year old male now out 4 months having completed I-125 interstitial implant for Gleason 7 adenocarcinoma the prostate. Patient also received pelvic radiation to prostate and pelvic nodes all for Gleason 8 (4+4) adenocarcinoma. He is seen today in routine follow-up and is doing well. His most recent PSA February 27 was 0.02. He is currently on Lupron therapy. He does have some urinary frequency nocturia 3 and some urgency. He did try Flomax although states that the showed no difference. He also has erectile dysfunction and is questioning me about treatment for that.  COMPLICATIONS OF TREATMENT: none  FOLLOW UP COMPLIANCE: keeps appointments   PHYSICAL EXAM:  BP (!) 152/74   Pulse 74   Temp (!) 95.5 F (35.3 C)   Resp 20   Wt 168 lb 5.1 oz (76.4 kg)   BMI 25.59 kg/m  Well-developed well-nourished patient in NAD. HEENT reveals PERLA, EOMI, discs not visualized.  Oral cavity is clear. No oral mucosal lesions are identified. Neck is clear without evidence of cervical or supraclavicular adenopathy. Lungs are clear to A&P. Cardiac examination is essentially unremarkable with regular rate and rhythm without murmur rub or thrill. Abdomen is benign with no organomegaly or masses noted. Motor sensory and DTR levels are equal and symmetric in the upper and lower extremities. Cranial nerves II through XII are grossly intact. Proprioception is intact. No peripheral adenopathy or edema is identified. No motor or sensory levels are noted. Crude visual fields are within normal range.  RADIOLOGY RESULTS: No current films for review  PLAN: Present time he is  doing well under excellent biochemical chemical control of his prostate cancer. He continues on Lupron which I have continue to recommend which is delivered under the urologist care. Also asked him to dress possible some other alternative strategies for his nocturia and frequency. Also urology can address his erectile dysfunction issue. I have asked to see him back in 6 months for follow-up. If PSA continues to remain in this range. Once your follow-up visits. Patient is to call with any concerns.  I would like to take this opportunity to thank you for allowing me to participate in the care of your patient.Noreene Filbert, MD

## 2017-11-29 ENCOUNTER — Other Ambulatory Visit: Payer: Self-pay | Admitting: Radiation Oncology

## 2017-12-11 ENCOUNTER — Other Ambulatory Visit: Payer: Self-pay | Admitting: Family Medicine

## 2017-12-11 DIAGNOSIS — E7800 Pure hypercholesterolemia, unspecified: Secondary | ICD-10-CM

## 2017-12-11 DIAGNOSIS — R7309 Other abnormal glucose: Secondary | ICD-10-CM

## 2017-12-11 DIAGNOSIS — Z8639 Personal history of other endocrine, nutritional and metabolic disease: Secondary | ICD-10-CM

## 2017-12-12 ENCOUNTER — Ambulatory Visit (INDEPENDENT_AMBULATORY_CARE_PROVIDER_SITE_OTHER): Payer: PPO

## 2017-12-12 VITALS — BP 100/70 | HR 72 | Temp 97.9°F | Ht 68.0 in | Wt 162.8 lb

## 2017-12-12 DIAGNOSIS — Z8639 Personal history of other endocrine, nutritional and metabolic disease: Secondary | ICD-10-CM | POA: Diagnosis not present

## 2017-12-12 DIAGNOSIS — R7309 Other abnormal glucose: Secondary | ICD-10-CM | POA: Diagnosis not present

## 2017-12-12 DIAGNOSIS — Z0000 Encounter for general adult medical examination without abnormal findings: Secondary | ICD-10-CM | POA: Diagnosis not present

## 2017-12-12 DIAGNOSIS — E7800 Pure hypercholesterolemia, unspecified: Secondary | ICD-10-CM

## 2017-12-12 DIAGNOSIS — H2589 Other age-related cataract: Secondary | ICD-10-CM | POA: Diagnosis not present

## 2017-12-12 DIAGNOSIS — H269 Unspecified cataract: Secondary | ICD-10-CM | POA: Insufficient documentation

## 2017-12-12 HISTORY — DX: Unspecified cataract: H26.9

## 2017-12-12 LAB — HEMOGLOBIN A1C: Hgb A1c MFr Bld: 5.9 % (ref 4.6–6.5)

## 2017-12-12 LAB — VITAMIN D 25 HYDROXY (VIT D DEFICIENCY, FRACTURES): VITD: 40.35 ng/mL (ref 30.00–100.00)

## 2017-12-12 NOTE — Patient Instructions (Signed)
Mr. Jacob , Thank you for taking time to come for your Medicare Wellness Visit. I appreciate your ongoing commitment to your health goals. Please review the following plan we discussed and let me know if I can assist you in the future.   These are the goals we discussed: Goals    . Increase physical activity     Starting 12/12/2017, I will continue to exercise for 90 minutes 3 days per week.        This is a list of the screening recommended for you and due dates:  Health Maintenance  Topic Date Due  . Colon Cancer Screening  07/30/2019  . Tetanus Vaccine  11/29/2019  . Flu Shot  Completed  .  Hepatitis C: One time screening is recommended by Center for Disease Control  (CDC) for  adults born from [birth year] through 1964.   Completed  . Pneumonia vaccines  Completed   Preventive Care for Adults  A healthy lifestyle and preventive care can promote health and wellness. Preventive health guidelines for adults include the following key practices.  . A routine yearly physical is a good way to check with your health care provider about your health and preventive screening. It is a chance to share any concerns and updates on your health and to receive a thorough exam.  . Visit your dentist for a routine exam and preventive care every 6 months. Brush your teeth twice a day and floss once a day. Good oral hygiene prevents tooth decay and gum disease.  . The frequency of eye exams is based on your age, health, family medical history, use  of contact lenses, and other factors. Follow your health care provider's recommendations for frequency of eye exams.  . Eat a healthy diet. Foods like vegetables, fruits, whole grains, low-fat dairy products, and lean protein foods contain the nutrients you need without too many calories. Decrease your intake of foods high in solid fats, added sugars, and salt. Eat the right amount of calories for you. Get information about a proper diet from your health care  provider, if necessary.  . Regular physical exercise is one of the most important things you can do for your health. Most adults should get at least 150 minutes of moderate-intensity exercise (any activity that increases your heart rate and causes you to sweat) each week. In addition, most adults need muscle-strengthening exercises on 2 or more days a week.  Silver Sneakers may be a benefit available to you. To determine eligibility, you may visit the website: www.silversneakers.com or contact program at 504-282-3027 Mon-Fri between 8AM-8PM.   . Maintain a healthy weight. The body mass index (BMI) is a screening tool to identify possible weight problems. It provides an estimate of body fat based on height and weight. Your health care provider can find your BMI and can help you achieve or maintain a healthy weight.   For adults 20 years and older: ? A BMI below 18.5 is considered underweight. ? A BMI of 18.5 to 24.9 is normal. ? A BMI of 25 to 29.9 is considered overweight. ? A BMI of 30 and above is considered obese.   . Maintain normal blood lipids and cholesterol levels by exercising and minimizing your intake of saturated fat. Eat a balanced diet with plenty of fruit and vegetables. Blood tests for lipids and cholesterol should begin at age 50 and be repeated every 5 years. If your lipid or cholesterol levels are high, you are over 50, or you  are at high risk for heart disease, you may need your cholesterol levels checked more frequently. Ongoing high lipid and cholesterol levels should be treated with medicines if diet and exercise are not working.  . If you smoke, find out from your health care provider how to quit. If you do not use tobacco, please do not start.  . If you choose to drink alcohol, please do not consume more than 2 drinks per day. One drink is considered to be 12 ounces (355 mL) of beer, 5 ounces (148 mL) of wine, or 1.5 ounces (44 mL) of liquor.  . If you are 98-79 years  old, ask your health care provider if you should take aspirin to prevent strokes.  . Use sunscreen. Apply sunscreen liberally and repeatedly throughout the day. You should seek shade when your shadow is shorter than you. Protect yourself by wearing long sleeves, pants, a wide-brimmed hat, and sunglasses year round, whenever you are outdoors.  . Once a month, do a whole body skin exam, using a mirror to look at the skin on your back. Tell your health care provider of new moles, moles that have irregular borders, moles that are larger than a pencil eraser, or moles that have changed in shape or color.

## 2017-12-12 NOTE — Progress Notes (Signed)
PCP notes:   Health maintenance:  No gaps identified.   Abnormal screenings:   Hearing - failed  Hearing Screening   125Hz  250Hz  500Hz  1000Hz  2000Hz  3000Hz  4000Hz  6000Hz  8000Hz   Right ear:   40 40 40  0    Left ear:   40 40 40  0     Mini-Cog score: 18/20 MMSE - Mini Mental State Exam 12/12/2017 12/05/2016  Orientation to time 5 5  Orientation to Place 5 5  Registration 3 3  Attention/ Calculation 0 0  Recall 1 3  Recall-comments unable to recall 2 of 3 words -  Language- name 2 objects 0 0  Language- repeat 1 1  Language- follow 3 step command 3 3  Language- read & follow direction 0 0  Write a sentence 0 0  Copy design 0 0  Total score 18 20    Patient concerns:   None  Nurse concerns:  None  Next PCP appt:   12/23/17 @  0930  I reviewed health advisor's note, was available for consultation on the day of service listed in this note, and agree with documentation and plan. Elsie Stain, MD.

## 2017-12-12 NOTE — Progress Notes (Signed)
Subjective:   Shane Stewart is a 72 y.o. male who presents for Medicare Annual/Subsequent preventive examination.  Review of Systems:  N/A Cardiac Risk Factors include: advanced age (>57men, >73 women);dyslipidemia;male gender     Objective:    Vitals: BP 100/70 (BP Location: Right Arm, Patient Position: Sitting, Cuff Size: Normal)   Pulse 72   Temp 97.9 F (36.6 C) (Oral)   Ht 5\' 8"  (1.727 m)   Wt 162 lb 12 oz (73.8 kg)   SpO2 98%   BMI 24.75 kg/m   Body mass index is 24.75 kg/m.  Advanced Directives 12/12/2017 11/25/2017 08/12/2017 07/16/2017 07/05/2017 06/18/2017 05/03/2017  Does Patient Have a Medical Advance Directive? Yes No No Yes Yes No Yes  Type of Paramedic of [place];Living will - - - Living will;Healthcare Power of [place];Living will  Does patient want to make changes to medical advance directive? No - Patient declined - - - No - Patient declined - -  Copy of [place] in Chart? No - copy requested - - - No - copy requested - -  Would patient like information on creating a medical advance directive? No - Patient declined No - Patient declined No - Patient declined - - No - Patient declined -    Tobacco Social History   Tobacco Use  Smoking Status Never Smoker  Smokeless Tobacco Never Used     Counseling given: No   Clinical Intake:  Pre-visit preparation completed: Yes  Pain : No/denies pain Pain Score: 0-No pain     Nutritional Status: BMI 25 -29 Overweight Nutritional Risks: None Diabetes: No  How often do you need to have someone help you when you read instructions, pamphlets, or other written materials from your doctor or pharmacy?: 1 - Never What is the last grade level you completed in school?: Masters degree  Interpreter Needed?: No  Comments: pt is a widower and lives alone Information entered by :: LPinson, LPN  Past Medical History:  Diagnosis Date  . Cancer  [facility])    prostate  . Cataract 12/12/2017   right eye  . Diverticulosis of colon 01/1999  . History of CT scan 04/1985   L/S, H/P L5/S1  . Hyperlipidemia   . Right knee pain    better with hyaluronic acid  . Rosacea    Past Surgical History:  Procedure Laterality Date  . APPENDECTOMY    . CYSTOSCOPY N/A 07/16/2017   Procedure: CYSTOSCOPY;  Surgeon: Hollice Espy, MD;  Location: [facility] ORS;  Service: Urology;  Laterality: N/A;  . LUMBAR SPINE SURGERY  1986   LS ruptured dis repair  . RADIOACTIVE SEED IMPLANT N/A 07/16/2017   Procedure: RADIOACTIVE SEED IMPLANT/BRACHYTHERAPY IMPLANT;  Surgeon: Hollice Espy, MD;  Location: [facility] ORS;  Service: Urology;  Laterality: N/A;  . TONSILLECTOMY    . UMBILICAL HERNIA REPAIR  10/05/2005   Family History  Problem Relation Age of Onset  . Alzheimer's disease Mother   . Dementia Mother   . Cancer Father        colon  . Heart disease Father        CAD  . Diabetes Father   . Colon cancer Father 1  . Diabetes Sister   . Heart disease Sister        MI CABG x 3  . Hypertension Sister   . Prostate cancer Neg Hx    Social History   Socioeconomic History  . Marital  status: Widowed    Spouse name: Not on file  . Number of children: 0  . Years of education: Not on file  . Highest education level: Not on file  Occupational History  . 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 Financial resource strain: Not on file  . Food insecurity:    Worry: Not on file    Inability: Not on file  . Transportation needs:    Medical: Not on file    Non-medical: Not on file  Tobacco Use  . Smoking status: Never Smoker  . Smokeless tobacco: Never Used  Substance and Sexual Activity  . Alcohol use: No  . Drug use: No  . Sexual activity: Yes  Lifestyle  . Physical activity:    Days per week: Not on file    Minutes per session: Not on file  . Stress: Not on file  Relationships  . Social connections:     Talks on phone: Not on file    Gets together: Not on file    Attends religious service: Not on file    Active member of club or organization: Not on file    Attends meetings of clubs or organizations: Not on file    Relationship status: Not on file  Other Topics Concern  . 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 Cholecalciferol 2000 units TABS Take 2,000 Units by mouth daily.  . metroNIDAZOLE (METROGEL) 0.75 % gel Apply topically daily. Apply to affected area daily as needed (Patient taking differently: Apply 1 application topically daily. Apply to affected area daily as needed)  . Multiple Vitamin (MULTIVITAMIN) tablet Take 1 tablet by mouth daily.  . tamsulosin (FLOMAX) 0.4 MG CAPS capsule TAKE 1 CAPSULE (0.4 MG TOTAL) BY MOUTH DAILY AFTER SUPPER.  . vitamin B-12 (CYANOCOBALAMIN) 1000 MCG tablet Take 1,000 mcg by mouth daily.   No facility-administered encounter medications on file as of 12/12/2017.     Activities of Daily Living In your present state of health, do you have any difficulty performing the following activities: 12/12/2017 07/16/2017  Hearing? N N  Vision? N N  Difficulty concentrating or making decisions? N N  Walking or climbing stairs? N N  Dressing or bathing? N N  Doing errands, shopping? N -  Preparing Food and eating ? N -  Using the Toilet? N -  In the past six months, have you accidently leaked urine? N -  Do you have problems with loss of bowel control? N -  Managing your Medications? N -  Managing your Finances? N -  Housekeeping or managing your Housekeeping? N -  Some recent data might be hidden    Patient Care Team: Tonia Ghent, MD as PCP - General (Family Medicine) Leandrew Koyanagi,  MD as Referring Physician (Ophthalmology)   Assessment:   This is a routine wellness examination for Shane Stewart.   Hearing Screening   125Hz  250Hz  500Hz  1000Hz  2000Hz  3000Hz  4000Hz  6000Hz  8000Hz   Right ear:   40 40 40  0    Left ear:   40 40 40  0    Vision Screening Comments: Last vision exam in May 2018 with  Dr. Wallace Going    Exercise Activities and Dietary recommendations Current Exercise Habits: Home exercise routine, Type of exercise: strength training/weights;stretching;Other - see comments(stationary bike, HIIT), Time (Minutes): > 60(90 minutes), Frequency (Times/Week): 3, Weekly Exercise (Minutes/Week): 0, Intensity: Moderate, Exercise limited by: None identified  Goals    . Increase physical activity     Starting 12/12/2017, I will continue to exercise for 90 minutes 3 days per week.        Fall Risk Fall Risk  12/12/2017 11/25/2017 12/10/2016 12/05/2016 12/08/2015  Falls in the past year? No No No No No   Depression Screen PHQ 2/9 Scores 12/12/2017 11/25/2017 12/10/2016 12/05/2016  PHQ - 2 Score 0 0 0 0  PHQ- 9 Score 0 - - -    Cognitive Function MMSE - Mini Mental State Exam 12/12/2017 12/05/2016  Orientation to time 5 5  Orientation to Place 5 5  Registration 3 3  Attention/ Calculation 0 0  Recall 1 3  Recall-comments unable to recall 2 of 3 words -  Language- name 2 objects 0 0  Language- repeat 1 1  Language- follow 3 step command 3 3  Language- read & follow direction 0 0  Write a sentence 0 0  Copy design 0 0  Total score 18 20     PLEASE NOTE: A Mini-Cog screen was completed. Maximum score is 20. A value of 0 denotes this part of [place] MMSE was not completed or the patient failed this part of the Mini-Cog screening.   Mini-Cog Screening Orientation to Time - Max 5 pts Orientation to Place - Max 5 pts Registration - Max 3 pts Recall - Max 3 pts Language Repeat - Max 1 pts Language Follow 3 Step Command - Max 3 pts c    Immunization History  Administered  Date(s) Administered  . H1N1 10/25/2008  . Hepatitis A 09/22/2008, 03/24/2009  . Hepatitis B 09/22/2008, 10/25/2008, 03/24/2009  . Influenza Whole 07/08/2002, 07/07/2008, 07/19/2010  . Influenza,inj,Quad PF,6+ Mos 07/09/2013, 06/28/2017  . Influenza-Unspecified 07/06/2014, 06/28/2015, 06/24/2016, 06/28/2017  . Pneumococcal Conjugate-13 10/21/2012, 12/07/2014  . Pneumococcal Polysaccharide-23 12/08/2015  . Td 02/15/2003, 10/25/2009  . Tdap 11/28/2009  . Zoster 03/24/2013    Screening Tests Health Maintenance  Topic Date Due  . COLONOSCOPY  07/30/2019  . TETANUS/TDAP  11/29/2019  . INFLUENZA VACCINE  Completed  . Hepatitis C Screening  Completed  . PNA vac Low Risk Adult  Completed      Plan:   I have personally reviewed, addressed, and noted the following in the patient's chart:  A. Medical and social history B. Use of alcohol, tobacco or illicit drugs  C. Current medications and supplements D. Functional ability and status E.  Nutritional status F.  Physical activity G. Advance directives H. List of other physicians I.  Hospitalizations, surgeries, and ER visits in previous 12 months J.  [place] to include hearing, vision, cognitive, depression L. Referrals and appointments - none  In addition, I have reviewed and discussed with patient certain preventive protocols, quality metrics, and best practice recommendations. A written personalized care plan for preventive services as well as general preventive health recommendations were provided to patient.  See attached scanned questionnaire for additional information.   Signed,   Lindell Noe, MHA, BS, LPN Health Coach

## 2017-12-13 LAB — COMPREHENSIVE METABOLIC PANEL
ALT: 17 U/L (ref 0–53)
AST: 17 U/L (ref 0–37)
Albumin: 4.2 g/dL (ref 3.5–5.2)
Alkaline Phosphatase: 71 U/L (ref 39–117)
BUN: 15 mg/dL (ref 6–23)
CHLORIDE: 103 meq/L (ref 96–112)
CO2: 25 meq/L (ref 19–32)
CREATININE: 0.84 mg/dL (ref 0.40–1.50)
Calcium: 9.6 mg/dL (ref 8.4–10.5)
GFR: 95.81 mL/min (ref >60.00)
Glucose, Bld: 99 mg/dL (ref 70–99)
Potassium: 4.3 mEq/L (ref 3.5–5.1)
SODIUM: 141 meq/L (ref 135–145)
Total Bilirubin: 0.6 mg/dL (ref 0.2–1.2)
Total Protein: 6.8 g/dL (ref 6.0–8.3)

## 2017-12-13 LAB — LIPID PANEL
CHOL/HDL RATIO: 3
CHOLESTEROL: 203 mg/dL — AB (ref 0–200)
HDL: 79.7 mg/dL (ref >39.00)
LDL CALC: 109 mg/dL — AB (ref 0–99)
NonHDL: 123.14
Triglycerides: 71 mg/dL (ref 0.0–149.0)
VLDL: 14.2 mg/dL (ref 0.0–40.0)

## 2017-12-23 ENCOUNTER — Ambulatory Visit (INDEPENDENT_AMBULATORY_CARE_PROVIDER_SITE_OTHER): Payer: PPO | Admitting: Family Medicine

## 2017-12-23 ENCOUNTER — Encounter: Payer: Self-pay | Admitting: Family Medicine

## 2017-12-23 VITALS — BP 118/64 | HR 64 | Temp 97.8°F | Wt 168.0 lb

## 2017-12-23 DIAGNOSIS — R29898 Other symptoms and signs involving the musculoskeletal system: Secondary | ICD-10-CM | POA: Diagnosis not present

## 2017-12-23 DIAGNOSIS — C61 Malignant neoplasm of prostate: Secondary | ICD-10-CM

## 2017-12-23 DIAGNOSIS — Z0000 Encounter for general adult medical examination without abnormal findings: Secondary | ICD-10-CM

## 2017-12-23 DIAGNOSIS — R1310 Dysphagia, unspecified: Secondary | ICD-10-CM | POA: Diagnosis not present

## 2017-12-23 DIAGNOSIS — N529 Male erectile dysfunction, unspecified: Secondary | ICD-10-CM

## 2017-12-23 DIAGNOSIS — Z7189 Other specified counseling: Secondary | ICD-10-CM

## 2017-12-23 MED ORDER — SILDENAFIL CITRATE 20 MG PO TABS: 60.0000 mg | ORAL_TABLET | Freq: Three times a day (TID) | ORAL | 12 refills | Status: DC

## 2017-12-23 NOTE — Patient Instructions (Addendum)
We will call about your referral to GI and ortho.  Take care.  Glad to see you.  Use sildenafil if needed and update me as needed.

## 2017-12-23 NOTE — Progress Notes (Signed)
Hearing - failed.  Declined hearing aids.   Recall d/w pt.  He has occ lapses.  D/w pt.  No red flag events.  Rechecked today.  3/3 recall today.  Neither he nor I think he has a true memory problem.   Colonoscopy 2015 Advance directive- would have his friend [name] designated if patient were incapacitated.   He has h/o intentional weight loss.   Prostate CA/PSA per urology.  He has urgency with nocturia.  flomax isn't helping a lot.  He has f/u pending.  No burning with urination.    He is dating now.  D/w pt about options.  He was concerned about ED.  D/w pt about options and routine cautions.   rx given to patient.   Prev flank pain.  He got better in the meantime.  He cut out nuts in the meantime and that seemed to help.  No sx in the meantime.  D/w pt.     R knee pain.  Mild, not severe.  Occ.  He is still exercising and putting up with his situation.    Some occ R shoulder pain.  Comes and goes.  Norma ROM but some crepitus/click with ROM.  No acute injury.    Some trouble swallowing with occ food sticking, esp bread.  Feels like it is near the clavicles.  He can still breathe with the events.  Going on for about 6 months.    PMH and SH reviewed  ROS: Per HPI unless specifically indicated in ROS section   Meds, vitals, and allergies reviewed.   GEN: nad, alert and oriented HEENT: mucous membranes moist NECK: supple w/o LA CV: rrr.  no murmur PULM: ctab, no inc wob ABD: soft, +bs EXT: no edema SKIN: no acute rash Normal R shoulder ROM but click with external rotation.  Normal int/ext rotation o/w.

## 2017-12-25 ENCOUNTER — Telehealth: Payer: Self-pay | Admitting: Family Medicine

## 2017-12-25 DIAGNOSIS — N529 Male erectile dysfunction, unspecified: Secondary | ICD-10-CM | POA: Insufficient documentation

## 2017-12-25 DIAGNOSIS — R29898 Other symptoms and signs involving the musculoskeletal system: Secondary | ICD-10-CM | POA: Insufficient documentation

## 2017-12-25 DIAGNOSIS — R1310 Dysphagia, unspecified: Secondary | ICD-10-CM | POA: Insufficient documentation

## 2017-12-25 NOTE — Telephone Encounter (Signed)
Paperwork is on your desk from [facility] to completed.

## 2017-12-25 NOTE — Assessment & Plan Note (Signed)
Some trouble swallowing with occ food sticking, esp bread.  Feels like it is near the clavicles.  He can still breathe with the events.  Going on for about 6 months.   D/w pt about anatomy and referral to GI.  He agrees.

## 2017-12-25 NOTE — Assessment & Plan Note (Signed)
I'll defer to uro, d/w pt.  He agrees.

## 2017-12-25 NOTE — Assessment & Plan Note (Signed)
No acute injury but reproducible click with external rotation.  Discussed with patient about options.  Refer to orthopedics.  He agrees. >25 minutes spent in face to face time with patient, >50% spent in counselling or coordination of care, discussing dysphasia, shoulder click, erectile dysfunction, etc.

## 2017-12-25 NOTE — Assessment & Plan Note (Signed)
Advance directive- would have his friend Brent Parnell designated if patient were incapacitated.  

## 2017-12-25 NOTE — Assessment & Plan Note (Signed)
He is dating now.  D/w pt about options.  He was concerned about ED.  D/w pt about options and routine cautions.   rx given to patient.  He'll update me as needed.

## 2017-12-25 NOTE — Telephone Encounter (Signed)
I'll work on the hard copy.  Thanks.  

## 2017-12-25 NOTE — Telephone Encounter (Signed)
Copied from [facility]. Topic: Quick Communication - Rx Refill/Question >> Dec 25, 2017  2:18 PM Waylan Rocher, [place] L wrote: Medication: sildenafil (REVATIO) 20 MG tablet Has the patient contacted their pharmacy? Yes.   (Agent: If no, request that the patient contact the pharmacy for the refill.) Preferred Pharmacy (with phone number or street name): [id number] - [place], [address]: [phone] Fax: [phone] Agent: Please be advised that RX refills may take up to 3 business days. We ask that you follow-up with your pharmacy.  [facility] calling for additional information for the PA on the script.  YDX#[id number]

## 2017-12-25 NOTE — Assessment & Plan Note (Signed)
Hearing - failed.  Declined hearing aids.   Recall d/w pt.  He has occ lapses.  D/w pt.  No red flag events.  Rechecked today.  3/3 recall today.  Neither he nor I think he has a true memory problem.   Colonoscopy 2015 Advance directive- would have his friend [name] designated if patient were incapacitated.   He has h/o intentional weight loss.

## 2017-12-26 ENCOUNTER — Encounter: Payer: Self-pay | Admitting: Family Medicine

## 2017-12-27 NOTE — Telephone Encounter (Signed)
Faxed

## 2017-12-30 DIAGNOSIS — M7541 Impingement syndrome of right shoulder: Secondary | ICD-10-CM | POA: Diagnosis not present

## 2018-01-01 ENCOUNTER — Encounter: Payer: Self-pay | Admitting: Family Medicine

## 2018-01-01 ENCOUNTER — Telehealth: Payer: Self-pay | Admitting: Family Medicine

## 2018-01-01 MED ORDER — SILDENAFIL CITRATE 20 MG PO TABS: 60.0000 mg | ORAL_TABLET | Freq: Every day | ORAL | 12 refills | Status: DC | PRN

## 2018-01-01 NOTE — Telephone Encounter (Signed)
Spoke to pharmacist Herbie Baltimore) and was advised that the script was sent in for three times a day and it usually is written for once a day. Pharmacist requesedt that a new script be sent in if it should be once a day.

## 2018-01-01 NOTE — Telephone Encounter (Signed)
Let me know what they need.  The PA was denied. Thanks.

## 2018-01-01 NOTE — Telephone Encounter (Signed)
Copied from [facility] [phone] [phone]. Topic: General - Other >> Jan 01, 2018  2:01 PM Darl Householder, RMA wrote: Reason for CRM: Please return call to [address], they are asking for medication clarification on sildenafil (REVATIO) 20 MG tablet

## 2018-01-01 NOTE — Telephone Encounter (Signed)
I resent that.  I apologize to patient and pharmacist.  This should be corrected now.  Many thanks for the notice from pharmacy.

## 2018-01-01 NOTE — Telephone Encounter (Signed)
Pharmacist notified as instructed by telephone and verbalized understanding. Pharmacist stated that he has gotten the new script.

## 2018-01-02 NOTE — Telephone Encounter (Signed)
Thanks

## 2018-01-03 ENCOUNTER — Encounter: Payer: Self-pay | Admitting: Radiation Oncology

## 2018-01-03 ENCOUNTER — Encounter: Payer: Self-pay | Admitting: Family Medicine

## 2018-01-08 ENCOUNTER — Encounter: Payer: Self-pay | Admitting: Family Medicine

## 2018-01-10 DIAGNOSIS — M25611 Stiffness of right shoulder, not elsewhere classified: Secondary | ICD-10-CM | POA: Diagnosis not present

## 2018-01-10 DIAGNOSIS — M25511 Pain in right shoulder: Secondary | ICD-10-CM | POA: Diagnosis not present

## 2018-01-15 DIAGNOSIS — R609 Edema, unspecified: Secondary | ICD-10-CM | POA: Diagnosis not present

## 2018-01-15 DIAGNOSIS — M25511 Pain in right shoulder: Secondary | ICD-10-CM | POA: Diagnosis not present

## 2018-01-15 DIAGNOSIS — M25611 Stiffness of right shoulder, not elsewhere classified: Secondary | ICD-10-CM | POA: Diagnosis not present

## 2018-01-17 DIAGNOSIS — R609 Edema, unspecified: Secondary | ICD-10-CM | POA: Diagnosis not present

## 2018-01-17 DIAGNOSIS — M25511 Pain in right shoulder: Secondary | ICD-10-CM | POA: Diagnosis not present

## 2018-01-17 DIAGNOSIS — M25611 Stiffness of right shoulder, not elsewhere classified: Secondary | ICD-10-CM | POA: Diagnosis not present

## 2018-01-22 ENCOUNTER — Encounter: Payer: Self-pay | Admitting: *Deleted

## 2018-01-24 DIAGNOSIS — H2513 Age-related nuclear cataract, bilateral: Secondary | ICD-10-CM | POA: Diagnosis not present

## 2018-01-29 ENCOUNTER — Ambulatory Visit (INDEPENDENT_AMBULATORY_CARE_PROVIDER_SITE_OTHER): Payer: PPO | Admitting: Gastroenterology

## 2018-01-29 ENCOUNTER — Encounter: Payer: Self-pay | Admitting: Gastroenterology

## 2018-01-29 VITALS — Ht 68.0 in | Wt 164.2 lb

## 2018-01-29 DIAGNOSIS — R1319 Other dysphagia: Secondary | ICD-10-CM

## 2018-01-29 DIAGNOSIS — R1310 Dysphagia, unspecified: Secondary | ICD-10-CM

## 2018-01-29 NOTE — Progress Notes (Signed)
[facility] Consult Note:  History: Shane Stewart 01/29/2018  Referring physician: Tonia Ghent, MD  Reason for consult/chief complaint: Dysphagia (with breads)   Subjective  HPI:  This is a very pleasant 72 year old man referred by Dr. Damita Dunnings for solid food dysphagia.  It is been going on for at least several months, typically occurs with meat, and he will feel it stuck in the neck briefly.  He does not get dysphagia to liquids, there is no odynophagia, nausea, vomiting, early satiety or weight loss.  In the past he would get heartburn on a regular basis, but now says it occurs rarely.  He has no lower digestive symptoms such as change in bowel habits or rectal bleeding.  His last colonoscopy with Dr. Deatra Ina in November 2015 revealed a diminutive polyp.  He was advised to have a repeat colonoscopy in 5 years due to the polyp and a family history of colon cancer in his father.  ROS:  Review of Systems  Constitutional: Negative for appetite change and unexpected weight change.  HENT: Negative for mouth sores and voice change.   Eyes: Negative for pain and redness.  Respiratory: Negative for cough and shortness of breath.   Cardiovascular: Negative for chest pain and palpitations.  Genitourinary: Positive for frequency. Negative for dysuria and hematuria.  Musculoskeletal: Negative for arthralgias and myalgias.  Skin: Negative for pallor and rash.  Neurological: Negative for weakness and headaches.  Hematological: Negative for adenopathy.     Past Medical History: Past Medical History:  Diagnosis Date  . Cancer [facility])    prostate  . Cataract 12/12/2017   right eye  . Diverticulosis of colon 01/1999  . History of CT scan 04/1985   L/S, H/P L5/S1  . Hyperlipidemia   . Prostate cancer ([place])   . Right knee pain    better with hyaluronic acid  . Rosacea      Past Surgical History: Past Surgical History:  Procedure Laterality Date  . APPENDECTOMY     . CYSTOSCOPY N/A 07/16/2017   Procedure: CYSTOSCOPY;  Surgeon: Hollice Espy, MD;  Location: [facility] ORS;  Service: Urology;  Laterality: N/A;  . LUMBAR SPINE SURGERY  1986   LS ruptured disc repair  . RADIOACTIVE SEED IMPLANT N/A 07/16/2017   Procedure: RADIOACTIVE SEED IMPLANT/BRACHYTHERAPY IMPLANT;  Surgeon: Hollice Espy, MD;  Location: [facility] ORS;  Service: Urology;  Laterality: N/A;  . RADIOACTIVE SEED IMPLANT     Prostate- [facility]  . TONSILLECTOMY    . UMBILICAL HERNIA REPAIR  10/05/2005     Family History: Family History  Problem Relation Age of Onset  . Alzheimer's disease Mother   . Dementia Mother   . Heart disease Father        CAD  . Diabetes Father   . Cancer - Colon Father        colon  . Diabetes Sister   . Heart disease Sister        MI CABG x 3  . Hypertension Sister   . Prostate cancer Neg Hx     Social History: Social History   Socioeconomic History  . Marital status: Widowed    Spouse name: Not on file  . Number of children: 0  . Years of education: Not on file  . Highest education level: Not on file  Occupational History  . 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 Financial resource strain: Not on file  .  Food insecurity:    Worry: Not on file    Inability: Not on file  . Transportation needs:    Medical: Not on file    Non-medical: Not on file  Tobacco Use  . Smoking status: Never Smoker  . Smokeless tobacco: Never Used  Substance and Sexual Activity  . Alcohol use: No  . Drug use: No  . Sexual activity: Yes  Lifestyle  . Physical activity:    Days per week: Not on file    Minutes per session: Not on file  . Stress: Not on file  Relationships  . Social connections:    Talks on phone: Not on file    Gets together: Not on file    Attends religious service: Not on file    Active member of club or organization: Not on file    Attends meetings of clubs or organizations: Not  on file    Relationship status: Not on file  Other Topics Concern  . 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 Cholecalciferol 2000 units TABS Take 2,000 Units by mouth daily.    . metroNIDAZOLE (METROGEL) 0.75 % gel Apply topically daily. Apply to affected area daily as needed (Patient taking differently: Apply 1 application topically daily. Apply to affected area daily as needed) 45 g 3  . Multiple Vitamin (MULTIVITAMIN) tablet Take 1 tablet by mouth daily.    . sildenafil (REVATIO) 20 MG tablet Take 3-5 tablets (60-100 mg total) by mouth daily as needed. 50 tablet 12  . tamsulosin (FLOMAX) 0.4 MG CAPS capsule TAKE 1 CAPSULE (0.4 MG TOTAL) BY MOUTH DAILY AFTER SUPPER. 30 capsule 11  . vitamin B-12 (CYANOCOBALAMIN) 1000 MCG tablet Take 1,000 mcg by mouth daily.     No current facility-administered medications for this visit.       ___________________________________________________________________ Objective   Exam:  Ht 5\' 8"  (1.727 m)   Wt 164 lb 4 oz (74.5 kg)   BMI 24.97 kg/m    General: this is a(n) well-appearing man with normal vocal quality  Eyes: sclera anicteric, no redness  ENT: oral mucosa moist without lesions, no cervical or supraclavicular or axillary lymphadenopathy, good dentition  CV: RRR without murmur, S1/S2, no JVD, no peripheral edema  Resp: clear to auscultation bilaterally, normal RR and effort noted  GI: soft, no tenderness, with active bowel sounds. No guarding or palpable organomegaly noted.  Skin; warm and dry, no rash or jaundice noted  Neuro: awake, alert and oriented x 3. Normal gross motor  function and fluent speech   Assessment: Encounter Diagnosis  Name Primary?  . Esophageal dysphagia Yes    This sounds most like a mechanical cause such as ring or stricture, less likely motility disorder.  He has rare reflux symptoms.  Plan:  Upper endoscopy with probable dilation.  He is agreeable after discussion of the procedure and risks.  The benefits and risks of the planned procedure were described in detail with the patient or (when appropriate) their health care proxy.  Risks were outlined as including, but not limited to, bleeding, infection, perforation, adverse medication reaction leading to cardiac  or pulmonary decompensation, or pancreatitis (if ERCP).  The limitation of incomplete mucosal visualization was also discussed.  No guarantees or warranties were given.   Thank you for the courtesy of this consult.  Please call me with any questions or concerns.  Nelida Meuse III  CC: Tonia Ghent, MD

## 2018-01-29 NOTE — Patient Instructions (Signed)
If you are age 71 or older, your body mass index should be between 23-30. Your Body mass index is 24.97 kg/m. If this is out of the aforementioned range listed, please consider follow up with your Primary Care Provider.  If you are age 12 or younger, your body mass index should be between 19-25. Your Body mass index is 24.97 kg/m. If this is out of the aformentioned range listed, please consider follow up with your Primary Care Provider.   You have been scheduled for an endoscopy. Please follow written instructions given to you at your visit today. If you use inhalers (even only as needed), please bring them with you on the day of your procedure. Your physician has requested that you go to www.startemmi.com and enter the access code given to you at your visit today. This web site gives a general overview about your procedure. However, you should still follow specific instructions given to you by our office regarding your preparation for the procedure.  Thank you for choosing [facility]  Dr Wilfrid Lund III

## 2018-02-10 ENCOUNTER — Encounter: Payer: Self-pay | Admitting: Gastroenterology

## 2018-02-19 ENCOUNTER — Ambulatory Visit (AMBULATORY_SURGERY_CENTER): Payer: PPO | Admitting: Gastroenterology

## 2018-02-19 ENCOUNTER — Other Ambulatory Visit: Payer: Self-pay

## 2018-02-19 ENCOUNTER — Encounter: Payer: Self-pay | Admitting: Gastroenterology

## 2018-02-19 VITALS — BP 108/64 | HR 69 | Temp 97.7°F | Resp 14 | Ht 68.0 in | Wt 164.0 lb

## 2018-02-19 DIAGNOSIS — K2100 Gastro-esophageal reflux disease with esophagitis, without bleeding: Secondary | ICD-10-CM

## 2018-02-19 DIAGNOSIS — R1310 Dysphagia, unspecified: Secondary | ICD-10-CM

## 2018-02-19 DIAGNOSIS — K219 Gastro-esophageal reflux disease without esophagitis: Secondary | ICD-10-CM | POA: Diagnosis not present

## 2018-02-19 MED ORDER — OMEPRAZOLE 40 MG PO CPDR: 40.0000 mg | DELAYED_RELEASE_CAPSULE | Freq: Every day | ORAL | 1 refills | Status: DC

## 2018-02-19 MED ORDER — SODIUM CHLORIDE 0.9 % IV SOLN
500.0000 mL | Freq: Once | INTRAVENOUS | Status: DC
Start: 2018-02-19 — End: 2018-04-04

## 2018-02-19 NOTE — Op Note (Signed)
[facility] Patient Name: Shane Stewart Procedure Date: 02/19/2018 10:26 AM MRN: [id number] Endoscopist: Mallie Mussel L. Loletha Carrow , MD Age: 72 Referring MD:  Date of Birth: 1946/02/17 Gender: Male Account #: [id number] Procedure:                Upper GI endoscopy Indications:              Esophageal dysphagia Medicines:                Monitored Anesthesia Care Procedure:                Pre-Anesthesia Assessment:                           - Prior to the procedure, a History and Physical                            was performed, and patient medications and                            allergies were reviewed. The patient's tolerance of                            previous anesthesia was also reviewed. The risks                            and benefits of the procedure and the sedation                            options and risks were discussed with the patient.                            All questions were answered, and informed consent                            was obtained. Prior Anticoagulants: The patient has                            taken no previous anticoagulant or antiplatelet                            agents. ASA Grade Assessment: II - A patient with                            mild systemic disease. After reviewing the risks                            and benefits, the patient was deemed in                            satisfactory condition to undergo the procedure.                           After obtaining informed consent, the endoscope was  passed under direct vision. Throughout the                            procedure, the patient's blood pressure, pulse, and                            oxygen saturations were monitored continuously. The                            Endoscope was introduced through the mouth, and                            advanced to the second part of duodenum. The upper                            GI endoscopy was accomplished without  difficulty.                            The patient tolerated the procedure well. Scope In: Scope Out: Findings:                 The larynx was normal.                           LA Grade B (one or more mucosal breaks greater than                            5 mm, not extending between the tops of two mucosal                            folds) esophagitis with no bleeding was found in                            the distal esophagus. This process was also causing                            mild lumenal narrowing. There were three short                            tongues of salmon colored mucosa 1-2 cm above the                            upper limit of the gastric folds. Biopsies were                            taken with a cold forceps for histology (r/o                            Barrett's esophagus). The scope was withdrawn.                            Dilation was performed with a Maloney dilator with  no resistance at 52 Fr.                           A small hiatal hernia was present.                           The stomach was normal.                           The cardia and gastric fundus were normal on                            retroflexion.                           The examined duodenum was normal. Complications:            No immediate complications. Estimated Blood Loss:     Estimated blood loss was minimal. Impression:               - Normal larynx.                           - LA Grade B reflux esophagitis. Biopsied. Dilated.                           - Small hiatal hernia.                           - Normal stomach.                           - Normal examined duodenum. Recommendation:           - Patient has a contact number available for                            emergencies. The signs and symptoms of potential                            delayed complications were discussed with the                            patient. Return to normal activities  tomorrow.                            Written discharge instructions were provided to the                            patient.                           - Resume previous diet.                           - Continue present medications.                           - Await pathology results.                           -  Use Prilosec (omeprazole) 40 mg PO daily (before                            a meal) for 8 weeks. Disp# 30, RF 1 Henry L. Loletha Carrow, MD 02/19/2018 10:45:46 AM This report has been signed electronically.

## 2018-02-19 NOTE — Progress Notes (Signed)
A and O x3. Report to RN. Tolerated MAC anesthesia well.Teeth unchanged after procedure.

## 2018-02-19 NOTE — Patient Instructions (Signed)
     Thank you for allowing [place] to care for you today!  Await pathology results by mail approximately 2 weeks.   Begin Prilosec  (Omeprazole) 40 mg by mouth daily before a meal for 8 weeks.   This has been sent to your pharmacy.  YOU HAD AN ENDOSCOPIC PROCEDURE TODAY AT [facility]:   Refer to the procedure report that was given to you for any specific questions about what was found during the examination.  If the procedure report does not answer your questions, please call your gastroenterologist to clarify.  If you requested that your care partner not be given the details of your procedure findings, then the procedure report has been included in a sealed envelope for you to review at your convenience later.  YOU SHOULD EXPECT: Some feelings of bloating in the abdomen. Passage of more gas than usual.  Walking can help get rid of the air that was put into your GI tract during the procedure and reduce the bloating. If you had a lower endoscopy (such as a colonoscopy or flexible sigmoidoscopy) you may notice spotting of blood in your stool or on the toilet paper. If you underwent a bowel prep for your procedure, you may not have a normal bowel movement for a few days.  Please Note:  You might notice some irritation and congestion in your nose or some drainage.  This is from the oxygen used during your procedure.  There is no need for concern and it should clear up in a day or so.  SYMPTOMS TO REPORT IMMEDIATELY:    Following upper endoscopy (EGD)  Vomiting of blood or coffee ground material  New chest pain or pain under the shoulder blades  Painful or persistently difficult swallowing  New shortness of breath  Fever of 100F or higher  Black, tarry-looking stools  For urgent or emergent issues, a gastroenterologist can be reached at any hour by calling [phone].   DIET:  We do recommend a small meal at first, but then you may proceed to your regular diet.  Drink  plenty of fluids but you should avoid alcoholic beverages for 24 hours.  ACTIVITY:  You should plan to take it easy for the rest of today and you should NOT DRIVE or use heavy machinery until tomorrow (because of the sedation medicines used during the test).    FOLLOW UP: Our staff will call the number listed on your records the next business day following your procedure to check on you and address any questions or concerns that you may have regarding the information given to you following your procedure. If we do not reach you, we will leave a message.  However, if you are feeling well and you are not experiencing any problems, there is no need to return our call.  We will assume that you have returned to your regular daily activities without incident.  If any biopsies were taken you will be contacted by phone or by letter within the next 1-3 weeks.  Please call us at [phone] if you have not heard about the biopsies in 3 weeks.    SIGNATURES/CONFIDENTIALITY: You and/or your care partner have signed paperwork which will be entered into your electronic medical record.  These signatures attest to the fact that that the information above on your After Visit Summary has been reviewed and is understood.  Full responsibility of the confidentiality of this discharge information lies with you and/or your care-partner.

## 2018-02-19 NOTE — Progress Notes (Signed)
Pt's states no medical or surgical changes since previsit or office visit. 

## 2018-02-19 NOTE — Progress Notes (Signed)
Called to room to assist during endoscopic procedure.  Patient ID and intended procedure confirmed with present staff. Received instructions for my participation in the procedure from the performing physician.  

## 2018-02-20 ENCOUNTER — Telehealth: Payer: Self-pay

## 2018-02-20 NOTE — Telephone Encounter (Signed)
  Follow up Call-  Call back number 02/19/2018  Post procedure Call Back phone  # [phone]  Permission to leave phone message Yes  Some recent data might be hidden     Patient questions:  Do you have a fever, pain , or abdominal swelling? No. Pain Score  0 *  Have you tolerated food without any problems? Yes.    Have you been able to return to your normal activities? Yes.    Do you have any questions about your discharge instructions: Diet   No. Medications  No. Follow up visit  No.  Do you have questions or concerns about your Care? No.  Actions: * If pain score is 4 or above: No action needed, pain <4.

## 2018-03-04 ENCOUNTER — Other Ambulatory Visit: Payer: PPO

## 2018-03-04 DIAGNOSIS — C61 Malignant neoplasm of prostate: Secondary | ICD-10-CM | POA: Diagnosis not present

## 2018-03-05 LAB — PSA TOTAL (REFLEX TO FREE)

## 2018-03-06 ENCOUNTER — Ambulatory Visit: Payer: PPO

## 2018-03-07 ENCOUNTER — Telehealth: Payer: Self-pay | Admitting: Urology

## 2018-03-07 NOTE — Telephone Encounter (Signed)
Shane Stewart Self [phone]  [name] called to get the results from his recent labs.

## 2018-03-11 NOTE — Telephone Encounter (Signed)
Patient notified of lab result.

## 2018-03-14 ENCOUNTER — Other Ambulatory Visit: Payer: Self-pay | Admitting: Gastroenterology

## 2018-03-19 ENCOUNTER — Ambulatory Visit: Payer: PPO | Admitting: Urology

## 2018-03-19 ENCOUNTER — Encounter: Payer: Self-pay | Admitting: Urology

## 2018-03-19 VITALS — BP 126/70 | HR 66 | Ht 68.0 in | Wt 167.0 lb

## 2018-03-19 DIAGNOSIS — C61 Malignant neoplasm of prostate: Secondary | ICD-10-CM | POA: Diagnosis not present

## 2018-03-19 MED ORDER — LEUPROLIDE ACETATE (6 MONTH) 45 MG IM KIT
45.0000 mg | PACK | Freq: Once | INTRAMUSCULAR | Status: AC
  Administered 2018-03-19: 45 mg via INTRAMUSCULAR

## 2018-03-19 NOTE — Progress Notes (Signed)
03/19/2018 8:23 AM   Shane Stewart 07-25-1946 [id number]  Referring provider: Tonia Ghent, MD [address] [place], [place] [address]  Chief Complaint  Patient presents with  . Prostate Cancer   Urologic problem list: - T2 high risk prostate cancer diagnosed June 2018; PSA 6.3 and 1 cm nodule at right apex. Pathology:(Right apex w/ nodule on DRE) Gleason 4+4 = 8 in4% of the left lateral apex Gleason 4+3 = 7 in40% and 27% of the left lateral base and left medial mid Gleason 3+3 = 6 prostate cancer in7% of the left medial apex.  Underwent brachytherapy/IMRT completed 06/2017; along with androgen deprivation   HPI: 72 year old male presents for follow-up of prostate cancer. His last Lupron injection was 09/06/2017.  He remains on tamsulosin and has stable lower urinary tract symptoms.  He feels his frequency is less.  He has occasional urinary urgency.  He has variable nocturia from 1-4.  He does have a ED.  He has taken sildenafil at 60 mg without significant improvement in his ED.  A PSA on 03/04/2018 was <0.1.  PMH: Past Medical History:  Diagnosis Date  . Cancer [facility])    prostate  . Cataract 12/12/2017   right eye  . Diverticulosis of colon 01/1999  . History of CT scan 04/1985   L/S, H/P L5/S1  . Hyperlipidemia   . Prostate cancer ([place])   . Right knee pain    better with hyaluronic acid  . Rosacea     Surgical History: Past Surgical History:  Procedure Laterality Date  . APPENDECTOMY    . CYSTOSCOPY N/A 07/16/2017   Procedure: CYSTOSCOPY;  Surgeon: Hollice Espy, MD;  Location: [facility] ORS;  Service: Urology;  Laterality: N/A;  . LUMBAR SPINE SURGERY  1986   LS ruptured disc repair  . RADIOACTIVE SEED IMPLANT N/A 07/16/2017   Procedure: RADIOACTIVE SEED IMPLANT/BRACHYTHERAPY IMPLANT;  Surgeon: Hollice Espy, MD;  Location: [facility] ORS;  Service: Urology;  Laterality: N/A;  . RADIOACTIVE SEED IMPLANT     Prostate- [facility]  .  TONSILLECTOMY    . UMBILICAL HERNIA REPAIR  10/05/2005    Home Medications:  Allergies as of 03/19/2018   No Known Allergies     Medication List        Accurate as of 03/19/18  8:23 AM. Always use your most recent med list.          Cholecalciferol 2000 units Tabs Take 2,000 Units by mouth daily.   metroNIDAZOLE 0.75 % gel Commonly known as:  METROGEL Apply topically daily. Apply to affected area daily as needed   multivitamin tablet Take 1 tablet by mouth daily.   omeprazole 40 MG capsule Commonly known as:  PRILOSEC TAKE 1 CAPSULE BY MOUTH EVERY DAY   sildenafil 20 MG tablet Commonly known as:  REVATIO Take 3-5 tablets (60-100 mg total) by mouth daily as needed.   tamsulosin 0.4 MG Caps capsule Commonly known as:  FLOMAX TAKE 1 CAPSULE (0.4 MG TOTAL) BY MOUTH DAILY AFTER SUPPER.   vitamin B-12 1000 MCG tablet Commonly known as:  CYANOCOBALAMIN Take 1,000 mcg by mouth daily.       Allergies: No Known Allergies  Family History: Family History  Problem Relation Age of Onset  . Alzheimer's disease Mother   . Dementia Mother   . Heart disease Father        CAD  . Diabetes Father   . Cancer - Colon Father  colon  . Diabetes Sister   . Heart disease Sister        MI CABG x 3  . Hypertension Sister   . Prostate cancer Neg Hx     Social History:  reports that he has never smoked. He has never used smokeless tobacco. He reports that he does not drink alcohol or use drugs.  ROS: UROLOGY Frequent Urination?: Yes Hard to postpone urination?: Yes Burning/pain with urination?: No Get up at night to urinate?: Yes Leakage of urine?: No Urine stream starts and stops?: No Trouble starting stream?: No Do you have to strain to urinate?: No Blood in urine?: No Urinary tract infection?: No Sexually transmitted disease?: No Injury to kidneys or bladder?: No Painful intercourse?: No Weak stream?: No Erection problems?: Yes Penile pain?:  No  Gastrointestinal Nausea?: No Vomiting?: No Indigestion/heartburn?: No Diarrhea?: No Constipation?: No  Constitutional Fever: No Night sweats?: No Weight loss?: No Fatigue?: No  Skin Skin rash/lesions?: No Itching?: No  Eyes Blurred vision?: No Double vision?: No  Ears/Nose/Throat Sore throat?: No Sinus problems?: No  Hematologic/Lymphatic Swollen glands?: No Easy bruising?: No  Cardiovascular Leg swelling?: No Chest pain?: No  Respiratory Cough?: No Shortness of breath?: No  Endocrine Excessive thirst?: No  Musculoskeletal Back pain?: No Joint pain?: No  Neurological Headaches?: No Dizziness?: No  Psychologic Depression?: No Anxiety?: No  Physical Exam: BP 126/70 (BP Location: Right Arm, Patient Position: Sitting, Cuff Size: Normal)   Pulse 66   Ht 5\' 8"  (1.727 m)   Wt 167 lb (75.8 kg)   BMI 25.39 kg/m   Constitutional:  Alert and oriented, No acute distress. HEENT: [place] AT, moist mucus membranes.  Trachea midline, no masses. Cardiovascular: No clubbing, cyanosis, or edema. Respiratory: Normal respiratory effort, no increased work of breathing. GI: Abdomen is soft, nontender, nondistended, no abdominal masses GU: No CVA tenderness.  Prostate 30 g, flat, smooth without nodules or induration Lymph: No cervical or inguinal lymphadenopathy. Skin: No rashes, bruises or suspicious lesions. Neurologic: Grossly intact, no focal deficits, moving all 4 extremities. Psychiatric: Normal mood and affect.   Assessment & Plan:   72 year old male with T2 high risk prostate cancer status post combined IMRT/brachytherapy along with adjuvant hormonal therapy.  His most recent PSA is undetectable.  He received a Lupron today.  Follow-up 6 months.  Discussed other ED treatments including intracavernosal injections and vacuum erection devices and he was provided literature.  He can increase his sildenafil to a maximum of 100 mg.   Abbie Sons,  [place] [address] [place], [place] [address] [phone]

## 2018-03-26 ENCOUNTER — Encounter: Payer: Self-pay | Admitting: Family Medicine

## 2018-03-26 ENCOUNTER — Ambulatory Visit: Payer: PPO | Admitting: Internal Medicine

## 2018-03-26 ENCOUNTER — Encounter: Payer: Self-pay | Admitting: Urology

## 2018-03-26 DIAGNOSIS — S39012A Strain of muscle, fascia and tendon of lower back, initial encounter: Secondary | ICD-10-CM | POA: Diagnosis not present

## 2018-03-26 DIAGNOSIS — M9903 Segmental and somatic dysfunction of lumbar region: Secondary | ICD-10-CM | POA: Diagnosis not present

## 2018-03-28 DIAGNOSIS — S39012A Strain of muscle, fascia and tendon of lower back, initial encounter: Secondary | ICD-10-CM | POA: Diagnosis not present

## 2018-03-28 DIAGNOSIS — M9903 Segmental and somatic dysfunction of lumbar region: Secondary | ICD-10-CM | POA: Diagnosis not present

## 2018-03-31 DIAGNOSIS — S39012A Strain of muscle, fascia and tendon of lower back, initial encounter: Secondary | ICD-10-CM | POA: Diagnosis not present

## 2018-03-31 DIAGNOSIS — M9903 Segmental and somatic dysfunction of lumbar region: Secondary | ICD-10-CM | POA: Diagnosis not present

## 2018-04-04 ENCOUNTER — Encounter: Payer: Self-pay | Admitting: Gastroenterology

## 2018-04-04 ENCOUNTER — Ambulatory Visit (INDEPENDENT_AMBULATORY_CARE_PROVIDER_SITE_OTHER): Payer: PPO | Admitting: Gastroenterology

## 2018-04-04 VITALS — BP 126/70 | HR 74 | Ht 68.0 in | Wt 166.1 lb

## 2018-04-04 DIAGNOSIS — K2100 Gastro-esophageal reflux disease with esophagitis, without bleeding: Secondary | ICD-10-CM

## 2018-04-04 DIAGNOSIS — R1319 Other dysphagia: Secondary | ICD-10-CM

## 2018-04-04 DIAGNOSIS — R1310 Dysphagia, unspecified: Secondary | ICD-10-CM

## 2018-04-04 NOTE — Progress Notes (Signed)
[name]  Chief Complaint: Dysphagia  Subjective  History:  Shane Stewart follows up after his recent upper endoscopy.  He had seen me for dysphagia, and had upper endoscopy on May 29.  This revealed mild distal luminal narrowing and some salmon-colored mucosa in the distal esophagus.  A 52 French bougie dilation was performed as well.  Biopsies did not show Barrett's esophagus, and were consistent with changes of reflux.  With those results, I put him on omeprazole 40 mg once daily. He has done very well since then, with significant improvement in dysphagia.  He really was not having much heartburn before, which is still the case.  His appetite is been good and his weight stable.  ROS: Cardiovascular:  no chest pain Respiratory: no dyspnea  The patient's Past Medical, Family and Social History were reviewed and are on file in the EMR.  Objective:  Med list reviewed  Current Outpatient Medications:  .  Cholecalciferol 2000 units TABS, Take 2,000 Units by mouth daily., Disp: , Rfl:  .  metroNIDAZOLE (METROGEL) 0.75 % gel, Apply topically daily. Apply to affected area daily as needed (Patient taking differently: Apply 1 application topically daily. Apply to affected area daily as needed), Disp: 45 g, Rfl: 3 .  Multiple Vitamin (MULTIVITAMIN) tablet, Take 1 tablet by mouth daily., Disp: , Rfl:  .  omeprazole (PRILOSEC) 40 MG capsule, TAKE 1 CAPSULE BY MOUTH EVERY DAY, Disp: 30 capsule, Rfl: 1 .  sildenafil (REVATIO) 20 MG tablet, Take 3-5 tablets (60-100 mg total) by mouth daily as needed., Disp: 50 tablet, Rfl: 12 .  tamsulosin (FLOMAX) 0.4 MG CAPS capsule, TAKE 1 CAPSULE (0.4 MG TOTAL) BY MOUTH DAILY AFTER SUPPER., Disp: 30 capsule, Rfl: 11 .  vitamin B-12 (CYANOCOBALAMIN) 1000 MCG tablet, Take 1,000 mcg by mouth daily., Disp: , Rfl:    Vital signs in last 24 hrs: Vitals:   04/04/18 0944  BP: 126/70  Pulse: 74    Physical Exam   HEENT: sclera anicteric, oral  mucosa moist without lesions  Cardiac: RRR without murmurs, S1S2 heard, no peripheral edema  Pulm: clear to auscultation bilaterally, normal RR and effort noted  Abdomen: soft, no tenderness, with active bowel sounds. No guarding or palpable hepatosplenomegaly.    Recent Labs:  Esophageal biopsies consistent with reflux, no Barrett's esophagus    @ASSESSMENTPLANBEGIN @ Assessment: Encounter Diagnoses  Name Primary?  . Gastroesophageal reflux disease with esophagitis Yes  . Esophageal dysphagia    He clearly had significant GERD causing esophagitis and, I suspect, distal esophageal dysmotility as a result.  That is improved on PPI, but I am not certain he he needs that in the long run. Esophageal dilation was performed, but he only had mild luminal narrowing with no resistance on passage of the bougie.  We discussed the need for diet and lifestyle measures related to GERD, and it sounds like he is generally good about this. Plan: Cut down omeprazole to every other day for the next week, then every 3 days for a week, then stop.  I would like him to message me within 4 to 6 weeks after that with an update on his symptoms.  If he starts having either increasing heartburn or return of dysphagia, he will need to go back on regular dosing of PPI.  At that point, we could probably try a lower dose and/or less frequent dosing.   Total time 15 minutes, over half spent face-to-face with patient in counseling and coordination  of care.   Nelida Meuse III

## 2018-04-04 NOTE — Patient Instructions (Signed)
If you are age 71 or older, your body mass index should be between 23-30. Your Body mass index is 25.26 kg/m. If this is out of the aforementioned range listed, please consider follow up with your Primary Care Provider.  If you are age 86 or younger, your body mass index should be between 19-25. Your Body mass index is 25.26 kg/m. If this is out of the aformentioned range listed, please consider follow up with your Primary Care Provider.   Follow up as needed. [phone]  It was a pleasure to see you today!  Dr. Loletha Carrow

## 2018-04-06 ENCOUNTER — Other Ambulatory Visit: Payer: Self-pay | Admitting: Gastroenterology

## 2018-04-07 DIAGNOSIS — S39012A Strain of muscle, fascia and tendon of lower back, initial encounter: Secondary | ICD-10-CM | POA: Diagnosis not present

## 2018-04-07 DIAGNOSIS — M9903 Segmental and somatic dysfunction of lumbar region: Secondary | ICD-10-CM | POA: Diagnosis not present

## 2018-04-23 DIAGNOSIS — S39012A Strain of muscle, fascia and tendon of lower back, initial encounter: Secondary | ICD-10-CM | POA: Diagnosis not present

## 2018-04-23 DIAGNOSIS — M9903 Segmental and somatic dysfunction of lumbar region: Secondary | ICD-10-CM | POA: Diagnosis not present

## 2018-05-06 ENCOUNTER — Other Ambulatory Visit: Payer: Self-pay | Admitting: Gastroenterology

## 2018-06-05 ENCOUNTER — Inpatient Hospital Stay: Payer: PPO | Attending: Radiation Oncology

## 2018-06-05 DIAGNOSIS — C61 Malignant neoplasm of prostate: Secondary | ICD-10-CM | POA: Diagnosis not present

## 2018-06-05 LAB — PSA: PROSTATIC SPECIFIC ANTIGEN: 0.01 ng/mL (ref 0.00–4.00)

## 2018-06-12 ENCOUNTER — Ambulatory Visit: Payer: PPO | Admitting: Radiation Oncology

## 2018-06-19 ENCOUNTER — Encounter: Payer: Self-pay | Admitting: Radiation Oncology

## 2018-06-19 ENCOUNTER — Other Ambulatory Visit: Payer: Self-pay

## 2018-06-19 ENCOUNTER — Ambulatory Visit
Admission: RE | Admit: 2018-06-19 | Discharge: 2018-06-19 | Disposition: A | Discharge status: Home / Self Care | Payer: PPO | Source: Ambulatory Visit | Attending: Radiation Oncology | Admitting: Radiation Oncology

## 2018-06-19 DIAGNOSIS — Z923 Personal history of irradiation: Secondary | ICD-10-CM | POA: Diagnosis not present

## 2018-06-19 DIAGNOSIS — C61 Malignant neoplasm of prostate: Secondary | ICD-10-CM | POA: Diagnosis not present

## 2018-06-19 NOTE — Progress Notes (Signed)
Radiation Oncology Follow up Note  Name: Shane Stewart   Date:   06/19/2018 MRN:  [id number] DOB: July 22, 1946    This 72 y.o. male presents to the clinic today for 1 year follow-up status post both external beam radiation therapy and I-125 interstitial implant for stage IIB adenocarcinoma the prostate.  REFERRING PROVIDER: Tonia Ghent, MD  HPI: patient is a 72 year old male now out 1 year status post both external beam treatment as well as I-125 interstitial implant for boost for Gleason 8 (4+4) adenocarcinoma presenting the PSA.of 6.3. He is seen today in routine follow up is doing well specifically denies diarrhea dysuria or any other GI/GU complaints. His most recent PSA is 0.01. He is currently on androgen deprivation therapy and we will check with Dr. Cherrie Gauze office for continuation of that treatment.  COMPLICATIONS OF TREATMENT: none  FOLLOW UP COMPLIANCE: keeps appointments   PHYSICAL EXAM:  There were no vitals taken for this visit. Well-developed well-nourished patient in NAD. HEENT reveals PERLA, EOMI, discs not visualized.  Oral cavity is clear. No oral mucosal lesions are identified. Neck is clear without evidence of cervical or supraclavicular adenopathy. Lungs are clear to A&P. Cardiac examination is essentially unremarkable with regular rate and rhythm without murmur rub or thrill. Abdomen is benign with no organomegaly or masses noted. Motor sensory and DTR levels are equal and symmetric in the upper and lower extremities. Cranial nerves II through XII are grossly intact. Proprioception is intact. No peripheral adenopathy or edema is identified. No motor or sensory levels are noted. Crude visual fields are within normal range.  RADIOLOGY RESULTS: no current films for review  PLAN: at the present time patient is doing well with knows side effect profile. He has excellent biochemical control of his disease. We will check with Dr. Mosie Lukes office on continuation of his androgen  deprivation therapy. I have asked to see him back in 1 year for follow-up. Patient is to call anytime with any concerns.  I would like to take this opportunity to thank you for allowing me to participate in the care of your patient.Noreene Filbert, MD

## 2018-08-18 DIAGNOSIS — L578 Other skin changes due to chronic exposure to nonionizing radiation: Secondary | ICD-10-CM | POA: Diagnosis not present

## 2018-08-18 DIAGNOSIS — L570 Actinic keratosis: Secondary | ICD-10-CM | POA: Diagnosis not present

## 2018-08-18 DIAGNOSIS — L400 Psoriasis vulgaris: Secondary | ICD-10-CM | POA: Diagnosis not present

## 2018-08-18 DIAGNOSIS — D1800 Hemangioma unspecified site: Secondary | ICD-10-CM | POA: Diagnosis not present

## 2018-08-18 DIAGNOSIS — L853 Xerosis cutis: Secondary | ICD-10-CM | POA: Diagnosis not present

## 2018-08-18 DIAGNOSIS — L821 Other seborrheic keratosis: Secondary | ICD-10-CM | POA: Diagnosis not present

## 2018-08-18 DIAGNOSIS — L820 Inflamed seborrheic keratosis: Secondary | ICD-10-CM | POA: Diagnosis not present

## 2018-09-18 ENCOUNTER — Other Ambulatory Visit: Payer: Self-pay

## 2018-09-18 DIAGNOSIS — C61 Malignant neoplasm of prostate: Secondary | ICD-10-CM

## 2018-09-19 ENCOUNTER — Other Ambulatory Visit: Payer: PPO

## 2018-09-19 DIAGNOSIS — C61 Malignant neoplasm of prostate: Secondary | ICD-10-CM | POA: Diagnosis not present

## 2018-09-20 LAB — PSA

## 2018-09-24 HISTORY — PX: OTHER SURGICAL HISTORY: SHX169

## 2018-09-25 ENCOUNTER — Ambulatory Visit: Payer: PPO | Admitting: Urology

## 2018-09-25 ENCOUNTER — Encounter: Payer: Self-pay | Admitting: Urology

## 2018-09-25 VITALS — BP 146/69 | HR 80 | Ht 68.0 in | Wt 170.6 lb

## 2018-09-25 DIAGNOSIS — C61 Malignant neoplasm of prostate: Secondary | ICD-10-CM | POA: Diagnosis not present

## 2018-09-25 DIAGNOSIS — R3915 Urgency of urination: Secondary | ICD-10-CM | POA: Insufficient documentation

## 2018-09-25 MED ORDER — LEUPROLIDE ACETATE (6 MONTH) 45 MG IM KIT
45.0000 mg | PACK | Freq: Once | INTRAMUSCULAR | Status: AC
  Administered 2018-09-25: 45 mg via INTRAMUSCULAR

## 2018-09-25 NOTE — Progress Notes (Signed)
09/25/2018 2:37 PM   Deidre Ala 03/29/1946 [id number]  Referring provider: Tonia Ghent, MD [address] [place], [place] [address]  Chief Complaint  Patient presents with  . Prostate Cancer   Urologic problem list: - T2 high risk prostate cancer diagnosed June 2018; PSA 6.3 and 1 cm nodule at right apex. Pathology:(Right apex w/ nodule on DRE) Gleason 4+4 = 8 in4% of the left lateral apex Gleason 4+3 = 7 in40% and 27% of the left lateral base and left medial mid Gleason 3+3 = 6 prostate cancer in7% of the left medial apex.  Underwent brachytherapy/IMRT completed 06/2017; along with androgen deprivation   HPI: 73 yo M presents for follow-up prostate cancer and ED.  -Last lupron injection was 03/19/2018   -Worsening urinary urgency but stopped tamsulosin 3-4 moths ago  -Most recent PSA from 09/19/2018 was <0.1.   PMH: Past Medical History:  Diagnosis Date  . Cancer [facility])    prostate  . Cataract 12/12/2017   right eye  . Diverticulosis of colon 01/1999  . History of CT scan 04/1985   L/S, H/P L5/S1  . Hyperlipidemia   . Prostate cancer ([place])   . Right knee pain    better with hyaluronic acid  . Rosacea     Surgical History: Past Surgical History:  Procedure Laterality Date  . APPENDECTOMY    . CYSTOSCOPY N/A 07/16/2017   Procedure: CYSTOSCOPY;  Surgeon: Hollice Espy, MD;  Location: [facility] ORS;  Service: Urology;  Laterality: N/A;  . LUMBAR SPINE SURGERY  1986   LS ruptured disc repair  . RADIOACTIVE SEED IMPLANT N/A 07/16/2017   Procedure: RADIOACTIVE SEED IMPLANT/BRACHYTHERAPY IMPLANT;  Surgeon: Hollice Espy, MD;  Location: [facility] ORS;  Service: Urology;  Laterality: N/A;  . RADIOACTIVE SEED IMPLANT     Prostate- [facility]  . TONSILLECTOMY    . UMBILICAL HERNIA REPAIR  10/05/2005    Home Medications:  Allergies as of 09/25/2018   No Known Allergies     Medication List       Accurate as of September 25, 2018  2:37 PM.  Always use your most recent med list.        Cholecalciferol 50 MCG (2000 UT) Tabs Take 2,000 Units by mouth daily.   metroNIDAZOLE 0.75 % gel Commonly known as:  METROGEL Apply topically daily. Apply to affected area daily as needed   multivitamin tablet Take 1 tablet by mouth daily.   omeprazole 40 MG capsule Commonly known as:  PRILOSEC TAKE 1 CAPSULE BY MOUTH EVERY DAY   sildenafil 20 MG tablet Commonly known as:  REVATIO Take 3-5 tablets (60-100 mg total) by mouth daily as needed.   vitamin B-12 1000 MCG tablet Commonly known as:  CYANOCOBALAMIN Take 1,000 mcg by mouth daily.       Allergies: No Known Allergies  Family History: Family History  Problem Relation Age of Onset  . Alzheimer's disease Mother   . Dementia Mother   . Heart disease Father        CAD  . Diabetes Father   . Cancer - Colon Father        colon  . Diabetes Sister   . Heart disease Sister        MI CABG x 3  . Hypertension Sister   . Prostate cancer Neg Hx     Social History:  reports that he has never smoked. He has never used smokeless tobacco. He reports that he does  not drink alcohol or use drugs.  ROS: UROLOGY Frequent Urination?: Yes Hard to postpone urination?: Yes Burning/pain with urination?: No Get up at night to urinate?: Yes Leakage of urine?: No Urine stream starts and stops?: No Trouble starting stream?: No Do you have to strain to urinate?: No Blood in urine?: No Urinary tract infection?: No Sexually transmitted disease?: No Injury to kidneys or bladder?: No Painful intercourse?: No Weak stream?: No Erection problems?: No Penile pain?: No  Gastrointestinal Nausea?: No Vomiting?: No Indigestion/heartburn?: No Diarrhea?: No Constipation?: No  Constitutional Fever: No Night sweats?: No Weight loss?: No Fatigue?: No  Skin Skin rash/lesions?: No Itching?: No  Eyes Blurred vision?: No Double vision?: No  Ears/Nose/Throat Sore throat?: No Sinus  problems?: No  Hematologic/Lymphatic Swollen glands?: No Easy bruising?: No  Cardiovascular Leg swelling?: No Chest pain?: No  Respiratory Cough?: No Shortness of breath?: No  Endocrine Excessive thirst?: No  Musculoskeletal Back pain?: No Joint pain?: No  Neurological Headaches?: No Dizziness?: No  Psychologic Depression?: No Anxiety?: No  Physical Exam: BP (!) 146/69 (BP Location: Left Arm, Patient Position: Sitting, Cuff Size: Normal)   Pulse 80   Ht 5\' 8"  (1.727 m)   Wt 170 lb 9.6 oz (77.4 kg)   BMI 25.94 kg/m   Constitutional:  Alert and oriented, No acute distress. HEENT: [place] AT, moist mucus membranes.  Trachea midline, no masses. Cardiovascular: No clubbing, cyanosis, or edema. Respiratory: Normal respiratory effort, no increased work of breathing. Skin: No rashes, bruises or suspicious lesions. Neurologic: Grossly intact, no focal deficits, moving all 4 extremities. Psychiatric: Normal mood and affect.  Laboratory Data: Component     Latest Ref Rng & Units 03/04/2018 09/19/2018  Prostate Specific Ag, Serum     0.0 - 4.0 ng/mL <0.1 <0.1   Assessment & Plan:   1. T2 high risk prostate cancerstatus post combined IMRT/brachytherapy along with adjuvant hormonal therapy  Most recent PSA is undetectable Received Lupron today  Recommend restarting Tamsuolsin every morning since it helped his urgency  F/u in June, 2020 for PSA and then radiation oncology appt w/ Dr. Donella Stade in December, 2020  Return in about 6 months (around 03/26/2019) for Lupron Injection .  Abbie Sons, [place] [address], [place], [place] [address] [phone]  I, Lucas Mallow, am acting as a scribe for Dr. Nicki Reaper C. ,  I, Abbie Sons, MD, have reviewed all documentation for this visit. The documentation on 09/25/18 for the exam, diagnosis, procedures, and orders are all accurate and complete.

## 2018-11-19 DIAGNOSIS — L400 Psoriasis vulgaris: Secondary | ICD-10-CM | POA: Diagnosis not present

## 2018-11-19 DIAGNOSIS — L570 Actinic keratosis: Secondary | ICD-10-CM | POA: Diagnosis not present

## 2018-11-19 DIAGNOSIS — L812 Freckles: Secondary | ICD-10-CM | POA: Diagnosis not present

## 2018-11-19 DIAGNOSIS — L578 Other skin changes due to chronic exposure to nonionizing radiation: Secondary | ICD-10-CM | POA: Diagnosis not present

## 2018-11-19 DIAGNOSIS — L821 Other seborrheic keratosis: Secondary | ICD-10-CM | POA: Diagnosis not present

## 2018-12-22 ENCOUNTER — Ambulatory Visit: Payer: PPO

## 2018-12-29 ENCOUNTER — Encounter: Payer: PPO | Admitting: Family Medicine

## 2019-01-26 ENCOUNTER — Other Ambulatory Visit: Payer: Self-pay | Admitting: Radiation Oncology

## 2019-02-25 ENCOUNTER — Other Ambulatory Visit (INDEPENDENT_AMBULATORY_CARE_PROVIDER_SITE_OTHER): Payer: PPO

## 2019-02-25 ENCOUNTER — Other Ambulatory Visit: Payer: Self-pay

## 2019-02-25 ENCOUNTER — Ambulatory Visit (INDEPENDENT_AMBULATORY_CARE_PROVIDER_SITE_OTHER): Payer: PPO

## 2019-02-25 ENCOUNTER — Other Ambulatory Visit: Payer: Self-pay | Admitting: Family Medicine

## 2019-02-25 DIAGNOSIS — E7800 Pure hypercholesterolemia, unspecified: Secondary | ICD-10-CM

## 2019-02-25 DIAGNOSIS — Z0000 Encounter for general adult medical examination without abnormal findings: Secondary | ICD-10-CM | POA: Diagnosis not present

## 2019-02-25 DIAGNOSIS — Z8639 Personal history of other endocrine, nutritional and metabolic disease: Secondary | ICD-10-CM

## 2019-02-25 DIAGNOSIS — R7309 Other abnormal glucose: Secondary | ICD-10-CM

## 2019-02-25 DIAGNOSIS — C61 Malignant neoplasm of prostate: Secondary | ICD-10-CM

## 2019-02-25 LAB — COMPREHENSIVE METABOLIC PANEL
ALT: 12 U/L (ref 0–53)
AST: 14 U/L (ref 0–37)
Albumin: 4.1 g/dL (ref 3.5–5.2)
Alkaline Phosphatase: 94 U/L (ref 39–117)
BUN: 19 mg/dL (ref 6–23)
CO2: 31 mEq/L (ref 19–32)
Calcium: 9.2 mg/dL (ref 8.4–10.5)
Chloride: 103 mEq/L (ref 96–112)
Creatinine, Ser: 0.78 mg/dL (ref 0.40–1.50)
GFR: 97.86 mL/min (ref >60.00)
Glucose, Bld: 96 mg/dL (ref 70–99)
Potassium: 4.5 mEq/L (ref 3.5–5.1)
Sodium: 140 mEq/L (ref 135–145)
Total Bilirubin: 0.7 mg/dL (ref 0.2–1.2)
Total Protein: 6.2 g/dL (ref 6.0–8.3)

## 2019-02-25 LAB — LIPID PANEL
Cholesterol: 196 mg/dL (ref 0–200)
HDL: 75.2 mg/dL (ref >39.00)
LDL Cholesterol: 108 mg/dL — ABNORMAL HIGH (ref 0–99)
NonHDL: 121.25
Total CHOL/HDL Ratio: 3
Triglycerides: 66 mg/dL (ref 0.0–149.0)
VLDL: 13.2 mg/dL (ref 0.0–40.0)

## 2019-02-25 LAB — HEMOGLOBIN A1C: Hgb A1c MFr Bld: 5.9 % (ref 4.6–6.5)

## 2019-02-25 LAB — PSA, MEDICARE: PSA: 0 ng/ml — ABNORMAL LOW (ref 0.10–4.00)

## 2019-02-25 LAB — VITAMIN D 25 HYDROXY (VIT D DEFICIENCY, FRACTURES): VITD: 40.18 ng/mL (ref 30.00–100.00)

## 2019-02-25 NOTE — Patient Instructions (Signed)
Shane Stewart , Thank you for taking time to come for your Medicare Wellness Visit. I appreciate your ongoing commitment to your health goals. Please review the following plan we discussed and let me know if I can assist you in the future.   These are the goals we discussed: Goals    . Increase physical activity     Weather permitting, I will continue to walk for 4-5 miles daily.        This is a list of the screening recommended for you and due dates:  Health Maintenance  Topic Date Due  . Flu Shot  04/25/2019  . Colon Cancer Screening  07/30/2019  . Tetanus Vaccine  11/29/2019  .  Hepatitis C: One time screening is recommended by Center for Disease Control  (CDC) for  adults born from [birth year] through 1964.   Completed  . Pneumonia vaccines  Completed   Preventive Care for Adults  A healthy lifestyle and preventive care can promote health and wellness. Preventive health guidelines for adults include the following key practices.  . A routine yearly physical is a good way to check with your health care provider about your health and preventive screening. It is a chance to share any concerns and updates on your health and to receive a thorough exam.  . Visit your dentist for a routine exam and preventive care every 6 months. Brush your teeth twice a day and floss once a day. Good oral hygiene prevents tooth decay and gum disease.  . The frequency of eye exams is based on your age, health, family medical history, use  of contact lenses, and other factors. Follow your health care provider's recommendations for frequency of eye exams.  . Eat a healthy diet. Foods like vegetables, fruits, whole grains, low-fat dairy products, and lean protein foods contain the nutrients you need without too many calories. Decrease your intake of foods high in solid fats, added sugars, and salt. Eat the right amount of calories for you. Get information about a proper diet from your health care provider, if  necessary.  . Regular physical exercise is one of the most important things you can do for your health. Most adults should get at least 150 minutes of moderate-intensity exercise (any activity that increases your heart rate and causes you to sweat) each week. In addition, most adults need muscle-strengthening exercises on 2 or more days a week.  Silver Sneakers may be a benefit available to you. To determine eligibility, you may visit the website: www.silversneakers.com or contact program at 775-025-0232 Mon-Fri between 8AM-8PM.   . Maintain a healthy weight. The body mass index (BMI) is a screening tool to identify possible weight problems. It provides an estimate of body fat based on height and weight. Your health care provider can find your BMI and can help you achieve or maintain a healthy weight.   For adults 20 years and older: ? A BMI below 18.5 is considered underweight. ? A BMI of 18.5 to 24.9 is normal. ? A BMI of 25 to 29.9 is considered overweight. ? A BMI of 30 and above is considered obese.   . Maintain normal blood lipids and cholesterol levels by exercising and minimizing your intake of saturated fat. Eat a balanced diet with plenty of fruit and vegetables. Blood tests for lipids and cholesterol should begin at age 20 and be repeated every 5 years. If your lipid or cholesterol levels are high, you are over 50, or you are at high  risk for heart disease, you may need your cholesterol levels checked more frequently. Ongoing high lipid and cholesterol levels should be treated with medicines if diet and exercise are not working.  . If you smoke, find out from your health care provider how to quit. If you do not use tobacco, please do not start.  . If you choose to drink alcohol, please do not consume more than 2 drinks per day. One drink is considered to be 12 ounces (355 mL) of beer, 5 ounces (148 mL) of wine, or 1.5 ounces (44 mL) of liquor.  . If you are 10-62 years old, ask your  health care provider if you should take aspirin to prevent strokes.  . Use sunscreen. Apply sunscreen liberally and repeatedly throughout the day. You should seek shade when your shadow is shorter than you. Protect yourself by wearing long sleeves, pants, a wide-brimmed hat, and sunglasses year round, whenever you are outdoors.  . Once a month, do a whole body skin exam, using a mirror to look at the skin on your back. Tell your health care provider of new moles, moles that have irregular borders, moles that are larger than a pencil eraser, or moles that have changed in shape or color.

## 2019-02-25 NOTE — Progress Notes (Signed)
PCP notes:   Health maintenance:  No gaps identified  Abnormal screenings:   None  Patient concerns:   None  Nurse concerns:  None  Next PCP appt:   03/02/19 @ 1200

## 2019-02-25 NOTE — Progress Notes (Signed)
Subjective:   Shane Stewart is a 73 y.o. male who presents for Medicare Annual (Subsequent) preventive examination.  Review of Systems:  N/A Cardiac Risk Factors include: advanced age (>62men, >21 women);dyslipidemia;male gender     Objective:     Vitals: There were no vitals taken for this visit.  There is no height or weight on file to calculate BMI.  Advanced Directives 02/25/2019 06/19/2018 02/19/2018 12/12/2017 11/25/2017 08/12/2017 07/16/2017  Does Patient Have a Medical Advance Directive? Yes No Yes Yes No No Yes  Type of Paramedic of [place];Living will - [place];Living will [place];Living will - - -  Does patient want to make changes to medical advance directive? - - No - Patient declined No - Patient declined - - -  Copy of [place] in Chart? No - copy requested - No - copy requested No - copy requested - - -  Would patient like information on creating a medical advance directive? - No - Patient declined - No - Patient declined No - Patient declined No - Patient declined -    Tobacco Social History   Tobacco Use  Smoking Status Never Smoker  Smokeless Tobacco Never Used     Counseling given: No   Clinical Intake:  Pre-visit preparation completed: Yes  Pain : No/denies pain Pain Score: 0-No pain     Nutritional Status: BMI 25 -29 Overweight Nutritional Risks: None Diabetes: No  How often do you need to have someone help you when you read instructions, pamphlets, or other written materials from your doctor or pharmacy?: 1 - Never  Interpreter Needed?: No  Comments: pt is a widower and lives alone Information entered by :: LPinson, LPN  Past Medical History:  Diagnosis Date  . Cancer [facility])    prostate  . Cataract 12/12/2017   right eye  . Diverticulosis of colon 01/1999  . History of CT scan 04/1985   L/S, H/P L5/S1  . Hyperlipidemia   . Prostate cancer ([place])   .  Right knee pain    better with hyaluronic acid  . Rosacea    Past Surgical History:  Procedure Laterality Date  . APPENDECTOMY    . CYSTOSCOPY N/A 07/16/2017   Procedure: CYSTOSCOPY;  Surgeon: Hollice Espy, MD;  Location: [facility] ORS;  Service: Urology;  Laterality: N/A;  . LUMBAR SPINE SURGERY  1986   LS ruptured disc repair  . RADIOACTIVE SEED IMPLANT N/A 07/16/2017   Procedure: RADIOACTIVE SEED IMPLANT/BRACHYTHERAPY IMPLANT;  Surgeon: Hollice Espy, MD;  Location: [facility] ORS;  Service: Urology;  Laterality: N/A;  . RADIOACTIVE SEED IMPLANT     Prostate- [facility]  . skin excision  2020   multiple   . TONSILLECTOMY    . UMBILICAL HERNIA REPAIR  10/05/2005   Family History  Problem Relation Age of Onset  . Alzheimer's disease Mother   . Dementia Mother   . Heart disease Father        CAD  . Diabetes Father   . Cancer - Colon Father        colon  . Diabetes Sister   . Heart disease Sister        MI CABG x 3  . Hypertension Sister   . Prostate cancer Neg Hx    Social History   Socioeconomic History  . Marital status: Widowed    Spouse name: Not on file  . Number of children: 0  . Years  of education: Not on file  . Highest education level: Not on file  Occupational History  . 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 Financial resource strain: Not on file  . Food insecurity:    Worry: Not on file    Inability: Not on file  . Transportation needs:    Medical: Not on file    Non-medical: Not on file  Tobacco Use  . Smoking status: Never Smoker  . Smokeless tobacco: Never Used  Substance and Sexual Activity  . Alcohol use: No  . Drug use: No  . Sexual activity: Yes  Lifestyle  . Physical activity:    Days per week: Not on file    Minutes per session: Not on file  . Stress: Not on file  Relationships  . Social connections:    Talks on phone: Not on file    Gets together: Not on file    Attends  religious service: Not on file    Active member of club or organization: Not on file    Attends meetings of clubs or organizations: Not on file    Relationship status: Not on file  Other Topics Concern  . 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 Cholecalciferol 2000 units TABS Take 2,000 Units by mouth daily.  . metroNIDAZOLE (METROGEL) 0.75 % gel Apply topically daily. Apply to affected area daily as needed (Patient taking differently: Apply 1 application topically daily. Apply to affected area daily as needed)  . Multiple Vitamin (MULTIVITAMIN) tablet Take 1 tablet by mouth daily.  . tamsulosin (FLOMAX) 0.4 MG CAPS capsule TAKE 1 CAPSULE (0.4 MG TOTAL) BY MOUTH DAILY AFTER SUPPER.  . vitamin B-12 (CYANOCOBALAMIN) 1000 MCG tablet Take 1,000 mcg by mouth daily.  . [DISCONTINUED] omeprazole (PRILOSEC) 40 MG capsule TAKE 1 CAPSULE BY MOUTH EVERY DAY  . [DISCONTINUED] sildenafil (REVATIO) 20 MG tablet Take 3-5 tablets (60-100 mg total) by mouth daily as needed.   No facility-administered encounter medications on file as of 02/25/2019.     Activities of Daily Living In your present state of health, do you have any difficulty performing the following activities: 02/25/2019  Hearing? Y  Vision? N  Difficulty concentrating or making decisions? N  Walking or climbing stairs? N  Dressing or bathing? N  Doing errands, shopping? N  Preparing Food and eating ? N  Using the Toilet? N  In the past six months, have you accidently leaked urine? N  Do you have problems with loss of bowel control? N  Managing your Medications? N  Managing your Finances? N  Housekeeping or managing your Housekeeping? N  Some  recent data might be hidden    Patient Care Team: Tonia Ghent, MD as PCP - General (Family Medicine) Leandrew Koyanagi, MD as Referring Physician (Ophthalmology)    Assessment:   This is a routine wellness examination for Shane Stewart.  Vision Screening Comments: Vision exam in Summer 2019 @ [facility] and Dietary recommendations Current Exercise Habits: Home exercise routine, Type of exercise: walking, Time (Minutes): 60, Frequency (Times/Week): 7, Weekly Exercise (Minutes/Week): 420, Intensity: Moderate,  Exercise limited by: None identified  Goals    . Increase physical activity     Weather permitting, I will continue to walk for 4-5 miles daily.        Fall Risk Fall Risk  02/25/2019 12/12/2017 11/25/2017 12/10/2016 12/05/2016  Falls in the past year? 0 No No No No   Depression Screen PHQ 2/9 Scores 02/25/2019 12/12/2017 11/25/2017 12/10/2016  PHQ - 2 Score 0 0 0 0  PHQ- 9 Score 0 0 - -     Cognitive Function MMSE - Mini Mental State Exam 02/25/2019 12/12/2017 12/05/2016  Orientation to time 5 5 5   Orientation to Place 5 5 5   Registration 3 3 3   Attention/ Calculation 0 0 0  Recall 3 1 3   Recall-comments - unable to recall 2 of 3 words -  Language- name 2 objects 0 0 0  Language- repeat 1 1 1   Language- follow 3 step command 0 3 3  Language- read & follow direction 0 0 0  Write a sentence 0 0 0  Copy design 0 0 0  Total score 17 18 20      PLEASE NOTE: A Mini-Cog screen was completed. Maximum score is 17. A value of 0 denotes this part of Folstein MMSE was not completed or the patient failed this part of the Mini-Cog screening.   Mini-Cog Screening Orientation to Time - Max 5 pts Orientation to Place - Max 5 pts Registration - Max 3 pts Recall - Max 3 pts Language Repeat - Max 1 pts      Immunization History  Administered Date(s) Administered  . H1N1 10/25/2008  . Hepatitis A 09/22/2008, 03/24/2009  . Hepatitis B 09/22/2008, 10/25/2008,  03/24/2009  . Influenza Whole 07/08/2002, 07/07/2008, 07/19/2010  . Influenza, High Dose Seasonal PF 06/28/2017  . Influenza,inj,Quad PF,6+ Mos 07/09/2013, 06/28/2017  . Influenza-Unspecified 07/06/2014, 06/28/2015, 06/24/2016, 06/28/2017  . Pneumococcal Conjugate-13 10/21/2012, 12/07/2014  . Pneumococcal Polysaccharide-23 12/08/2015  . Td 02/15/2003, 10/25/2009  . Tdap 11/28/2009  . Zoster 03/24/2013    Screening Tests Health Maintenance  Topic Date Due  . INFLUENZA VACCINE  04/25/2019  . COLONOSCOPY  07/30/2019  . TETANUS/TDAP  11/29/2019  . Hepatitis C Screening  Completed  . PNA vac Low Risk Adult  Completed     Plan:      I have personally reviewed, addressed, and noted the following in the patient's chart:  A. Medical and social history B. Use of alcohol, tobacco or illicit drugs  C. Current medications and supplements D. Functional ability and status E.  Nutritional status F.  Physical activity G. Advance directives H. List of other physicians I.  Hospitalizations, surgeries, and ER visits in previous 12 months J.  Vitals (unless it is a telemedicine encounter) K. Screenings to include cognitive, depression, hearing, vision (NOTE: hearing and vision screenings not completed in telemedicine encounter) L. Referrals and appointments   In addition, I have reviewed and discussed with patient certain preventive protocols, quality metrics, and best practice recommendations. A written personalized care plan for preventive services and recommendations were provided to patient.  With patient's permission, we connected on 02/25/19 at 12:00 PM EDT by a video enabled telemedicine application. Two patient identifiers were used to ensure the encounter occurred with the correct person.    Patient was in home and writer was in office.   Signed,   Lindell Noe, MHA, BS, LPN Health Coach

## 2019-02-26 NOTE — Progress Notes (Signed)
   Subjective:    Patient ID: Shane Stewart, male    DOB: July 06, 1946, 73 y.o.   MRN: [id number]  HPI I reviewed health advisor's note, was available for consultation, and agree with documentation and plan.    Review of Systems     Objective:   Physical Exam         Assessment & Plan:

## 2019-03-02 ENCOUNTER — Ambulatory Visit (INDEPENDENT_AMBULATORY_CARE_PROVIDER_SITE_OTHER): Payer: PPO | Admitting: Family Medicine

## 2019-03-02 ENCOUNTER — Encounter: Payer: Self-pay | Admitting: Family Medicine

## 2019-03-02 DIAGNOSIS — L719 Rosacea, unspecified: Secondary | ICD-10-CM | POA: Diagnosis not present

## 2019-03-02 DIAGNOSIS — Z0000 Encounter for general adult medical examination without abnormal findings: Secondary | ICD-10-CM

## 2019-03-02 DIAGNOSIS — M25569 Pain in unspecified knee: Secondary | ICD-10-CM

## 2019-03-02 DIAGNOSIS — C61 Malignant neoplasm of prostate: Secondary | ICD-10-CM | POA: Diagnosis not present

## 2019-03-02 DIAGNOSIS — M7540 Impingement syndrome of unspecified shoulder: Secondary | ICD-10-CM

## 2019-03-02 DIAGNOSIS — Z7189 Other specified counseling: Secondary | ICD-10-CM

## 2019-03-02 MED ORDER — METRONIDAZOLE 0.75 % EX GEL: 1.0000 "application " | Freq: Every day | CUTANEOUS | 12 refills | Status: DC

## 2019-03-02 NOTE — Progress Notes (Signed)
Virtual visit completed through WebEx or similar program Patient location: home  Provider location: Financial controller at [facility], office   Pandemic considerations d/w pt.   Limitations and rationale for visit method d/w patient.  Patient agreed to proceed.   CC: follow up.    HPI:  Colonoscopy 2015, d/w pt about possible 2020 f/u per GI, depending on the pandemic.   Advance directive- would have his friend [name] designated if patient were incapacitated.  Tetanus 2011 zostavax 2014 PNA up to date Flu encouraged.    Using metrogel w/o ADE.  D/w pt.  Refill done.   Prostate cancer.  PSA 0.  Has f/u pending with urology.   LUTS d/w pt.  He has some daytime urgency.  He is putting up with that.  No ADE on flomax.  No blood in urine.    Prev flank pain.  He cut out nuts in the meantime and that seemed to help.  No sx in the meantime.  D/w pt.     R knee pain.  Mild, not severe.  Occ.  He is still exercising and putting up with his situation.  He is still walking a lot.    Some occ R shoulder pain.  Comes and goes.  improved with PT exercises.    No dysphagia.  Prev EGD done.    Meds and allergies reviewed.   ROS: Per HPI unless specifically indicated in ROS section   NAD Speech wnl  A/P:  Colonoscopy 2015, d/w pt about possible 2020 f/u per GI, depending on the pandemic.   Advance directive- would have his friend [name] designated if patient were incapacitated.  Tetanus 2011 zostavax 2014 PNA up to date Flu encouraged.    Using metrogel w/o ADE.  D/w pt.  Refill done.  Continue as is.  Prostate cancer.  PSA 0.  Has f/u pending with urology.   LUTS d/w pt.  He has some daytime urgency.  He is putting up with that.  No ADE on flomax.  No blood in urine.  We will route this note to urology as FYI since his PSA is already been done.  R knee pain.  Mild, not severe.  Occ.  He is still exercising and putting up with his situation.  He is still walking a lot.     Some occ R shoulder pain.  Comes and goes.  improved with PT exercises.  He will update me as needed.  No dysphagia.  Prev EGD done.  He will update me as needed.  >25 minutes spent in face to face time with patient, >50% spent in counselling or coordination of care

## 2019-03-04 NOTE — Assessment & Plan Note (Signed)
Colonoscopy 2015, d/w pt about possible 2020 f/u per GI, depending on the pandemic.   Advance directive- would have his friend [name] designated if patient were incapacitated.  Tetanus 2011 zostavax 2014 PNA up to date Flu encouraged.

## 2019-03-04 NOTE — Assessment & Plan Note (Signed)
Advance directive- would have his friend Brent Parnell designated if patient were incapacitated.  

## 2019-03-04 NOTE — Assessment & Plan Note (Signed)
Prostate cancer.  PSA 0.  Has f/u pending with urology.   LUTS d/w pt.  He has some daytime urgency.  He is putting up with that.  No ADE on flomax.  No blood in urine.  We will route this note to urology as FYI since his PSA is already been done.

## 2019-03-04 NOTE — Assessment & Plan Note (Signed)
Some occ R shoulder pain.  Comes and goes.  improved with PT exercises.  He will update me as needed.

## 2019-03-04 NOTE — Assessment & Plan Note (Signed)
  R knee pain.  Mild, not severe.  Occ.  He is still exercising and putting up with his situation.  He is still walking a lot.

## 2019-03-04 NOTE — Assessment & Plan Note (Signed)
Using metrogel w/o ADE.  D/w pt.  Refill done.  Continue as is.

## 2019-03-16 ENCOUNTER — Other Ambulatory Visit: Payer: Self-pay

## 2019-03-16 DIAGNOSIS — C61 Malignant neoplasm of prostate: Secondary | ICD-10-CM

## 2019-03-20 ENCOUNTER — Other Ambulatory Visit: Payer: PPO

## 2019-03-20 ENCOUNTER — Other Ambulatory Visit: Payer: Self-pay

## 2019-03-20 DIAGNOSIS — C61 Malignant neoplasm of prostate: Secondary | ICD-10-CM | POA: Diagnosis not present

## 2019-03-21 LAB — PSA: Prostate Specific Ag, Serum: <0.1 ng/mL (ref 0.0–4.0)

## 2019-03-26 ENCOUNTER — Ambulatory Visit: Payer: PPO | Admitting: Urology

## 2019-03-26 ENCOUNTER — Other Ambulatory Visit: Payer: Self-pay

## 2019-03-26 ENCOUNTER — Encounter: Payer: Self-pay | Admitting: Urology

## 2019-03-26 VITALS — BP 145/77 | HR 71 | Ht 68.0 in | Wt 169.9 lb

## 2019-03-26 DIAGNOSIS — C61 Malignant neoplasm of prostate: Secondary | ICD-10-CM

## 2019-03-26 NOTE — Progress Notes (Signed)
03/26/2019 1:41 PM   Shane Stewart 05-10-1946 [id number]  Referring provider: Tonia Ghent, MD [place],  [place] [address]  Chief Complaint  Patient presents with  . Prostate Cancer    Urologic problem list: - T2high risk prostate cancer diagnosed June 2018; PSA 6.3 and 1 cm nodule at right apex. Pathology:(Right apex w/ nodule on DRE) Gleason 4+4 = 8 in4% of the left lateral apex Gleason 4+3 = 7 in40% and 27% of the left lateral base and left medial mid Gleason 3+3 = 6 prostate cancer in7% of the left medial apex.  Underwent brachytherapy/IMRTcompleted 06/2017; along with androgen deprivation    HPI: 73 year old male presents for prostate cancer follow-up.  Since his last visit January 2020 he states he has been doing well.  He has stable urinary frequency and urgency.  PSA drawn 03/14/2019 was <0.1.  His last Lupron injection was 09/25/2018.   PMH: Past Medical History:  Diagnosis Date  . Cancer [facility])    prostate  . Cataract 12/12/2017   right eye  . Diverticulosis of colon 01/1999  . History of CT scan 04/1985   L/S, H/P L5/S1  . Hyperlipidemia   . Prostate cancer ([place])   . Right knee pain    better with hyaluronic acid  . Rosacea     Surgical History: Past Surgical History:  Procedure Laterality Date  . APPENDECTOMY    . CYSTOSCOPY N/A 07/16/2017   Procedure: CYSTOSCOPY;  Surgeon: Hollice Espy, MD;  Location: [facility] ORS;  Service: Urology;  Laterality: N/A;  . LUMBAR SPINE SURGERY  1986   LS ruptured disc repair  . RADIOACTIVE SEED IMPLANT N/A 07/16/2017   Procedure: RADIOACTIVE SEED IMPLANT/BRACHYTHERAPY IMPLANT;  Surgeon: Hollice Espy, MD;  Location: [facility] ORS;  Service: Urology;  Laterality: N/A;  . RADIOACTIVE SEED IMPLANT     Prostate- [facility]  . skin excision  2020   multiple   . TONSILLECTOMY    . UMBILICAL HERNIA REPAIR  10/05/2005    Home Medications:  Allergies as of 03/26/2019   No Known  Allergies     Medication List       Accurate as of March 26, 2019  1:41 PM. If you have any questions, ask your nurse or doctor.        azelastine 0.1 % nasal spray Commonly known as: ASTELIN SPRAY 2 SPRAYS INTO THE APPROPRIATE NOSTRILS 2 TIMES PER DAY   Cholecalciferol 50 MCG (2000 UT) Tabs Take 2,000 Units by mouth daily.   fluticasone 50 MCG/ACT nasal spray Commonly known as: FLONASE PLACE 1 SPRAY INTO EACH NOSTRI TWICE A DAY AS NEEDED FOR ALLERGY SYMPTOMS   metroNIDAZOLE 0.75 % gel Commonly known as: METROGEL Apply 1 application topically daily.   multivitamin tablet Take 1 tablet by mouth daily.   tamsulosin 0.4 MG Caps capsule Commonly known as: FLOMAX TAKE 1 CAPSULE (0.4 MG TOTAL) BY MOUTH DAILY AFTER SUPPER.   vitamin B-12 1000 MCG tablet Commonly known as: CYANOCOBALAMIN Take 1,000 mcg by mouth daily.       Allergies: No Known Allergies  Family History: Family History  Problem Relation Age of Onset  . Alzheimer's disease Mother   . Dementia Mother   . Heart disease Father        CAD  . Diabetes Father   . Cancer - Colon Father        colon  . Diabetes Sister   . Heart disease Sister  MI CABG x 3  . Hypertension Sister   . Prostate cancer Neg Hx     Social History:  reports that he has never smoked. He has never used smokeless tobacco. He reports that he does not drink alcohol or use drugs.  ROS: UROLOGY Frequent Urination?: Yes Hard to postpone urination?: Yes Burning/pain with urination?: No Get up at night to urinate?: Yes Leakage of urine?: No Urine stream starts and stops?: Yes Trouble starting stream?: Yes Do you have to strain to urinate?: No Blood in urine?: No Urinary tract infection?: No Sexually transmitted disease?: No Injury to kidneys or bladder?: No Painful intercourse?: No Weak stream?: No Erection problems?: No Penile pain?: No  Gastrointestinal Nausea?: No Vomiting?: No Indigestion/heartburn?: No  Diarrhea?: No Constipation?: No  Constitutional Fever: No Night sweats?: No Weight loss?: No Fatigue?: No  Skin Skin rash/lesions?: No Itching?: No  Eyes Blurred vision?: No Double vision?: No  Ears/Nose/Throat Sore throat?: No Sinus problems?: No  Hematologic/Lymphatic Swollen glands?: No Easy bruising?: No  Cardiovascular Leg swelling?: No Chest pain?: No  Respiratory Cough?: No Shortness of breath?: No  Endocrine Excessive thirst?: No  Musculoskeletal Back pain?: No Joint pain?: No  Neurological Headaches?: No Dizziness?: No  Psychologic Depression?: No Anxiety?: No  Physical Exam: BP (!) 145/77 (BP Location: Left Arm, Patient Position: Sitting, Cuff Size: Normal)   Pulse 71   Ht 5\' 8"  (1.727 m)   Wt 169 lb 14.4 oz (77.1 kg)   BMI 25.83 kg/m   Constitutional:  Alert and oriented, No acute distress. HEENT: [place] AT, moist mucus membranes.  Trachea midline, no masses. Cardiovascular: No clubbing, cyanosis, or edema. Respiratory: Normal respiratory effort, no increased work of breathing. Skin: No rashes, bruises or suspicious lesions. Neurologic: Grossly intact, no focal deficits, moving all 4 extremities. Psychiatric: Normal mood and affect.   Assessment & Plan:   73 year old male with T2 high risk prostate cancer.  PSA remains undetectable on androgen deprivation.  He has been on ADT x21 months.  He has a follow-up appointment with Dr. Baruch Gouty in radiation oncology in September.  Abbie Sons, [place] [address] [place], [place] [address] [phone]

## 2019-03-27 ENCOUNTER — Encounter: Payer: Self-pay | Admitting: Radiation Oncology

## 2019-06-11 ENCOUNTER — Inpatient Hospital Stay: Payer: PPO | Attending: Radiation Oncology

## 2019-06-11 ENCOUNTER — Other Ambulatory Visit: Payer: Self-pay

## 2019-06-11 DIAGNOSIS — C61 Malignant neoplasm of prostate: Secondary | ICD-10-CM | POA: Diagnosis not present

## 2019-06-11 LAB — PSA: Prostatic Specific Antigen: <0.01 ng/mL (ref 0.00–4.00)

## 2019-06-17 ENCOUNTER — Other Ambulatory Visit: Payer: Self-pay

## 2019-06-18 ENCOUNTER — Other Ambulatory Visit: Payer: Self-pay

## 2019-06-18 ENCOUNTER — Encounter: Payer: Self-pay | Admitting: Radiation Oncology

## 2019-06-18 ENCOUNTER — Ambulatory Visit
Admission: RE | Admit: 2019-06-18 | Discharge: 2019-06-18 | Disposition: A | Discharge status: Home / Self Care | Payer: PPO | Source: Ambulatory Visit | Attending: Radiation Oncology | Admitting: Radiation Oncology

## 2019-06-18 VITALS — BP 152/79 | HR 73 | Temp 96.4°F | Resp 16 | Wt 169.8 lb

## 2019-06-18 DIAGNOSIS — Z923 Personal history of irradiation: Secondary | ICD-10-CM | POA: Diagnosis not present

## 2019-06-18 DIAGNOSIS — C61 Malignant neoplasm of prostate: Secondary | ICD-10-CM

## 2019-06-18 NOTE — Progress Notes (Signed)
Radiation Oncology Follow up Note  Name: Shane Stewart   Date:   06/18/2019 MRN:  [id number] DOB: [birth year]    This 73 y.o. male presents to the clinic today for 2-year follow-up status post both external beam radiation therapy as well as I-125 interstitial implant for stage IIb adenocarcinoma the prostate.  REFERRING PROVIDER: Tonia Ghent, MD  HPI: Patient is a 73 year old male now 2 years having completed both external beam radiation therapy as well as I-125 implant for stage IIb adenocarcinoma the prostate.  He was late recently a Gleason 8 (4+4) adenocarcinoma presented with a PSA of 6.3 seen today in routine follow-up he is doing extremely well.  Specifically denies any increased lower urinary tract symptoms except urgency.  He is having no diarrhea.  His most recent PSA is less than [id number].  COMPLICATIONS OF TREATMENT: none  FOLLOW UP COMPLIANCE: keeps appointments   PHYSICAL EXAM:  BP (!) 152/79 (BP Location: Left Arm, Patient Position: Sitting)   Pulse 73   Temp (!) 96.4 F (35.8 C) (Tympanic)   Resp 16   Wt 169 lb 12.8 oz (77 kg)   BMI 25.82 kg/m  Well-developed well-nourished patient in NAD. HEENT reveals PERLA, EOMI, discs not visualized.  Oral cavity is clear. No oral mucosal lesions are identified. Neck is clear without evidence of cervical or supraclavicular adenopathy. Lungs are clear to A&P. Cardiac examination is essentially unremarkable with regular rate and rhythm without murmur rub or thrill. Abdomen is benign with no organomegaly or masses noted. Motor sensory and DTR levels are equal and symmetric in the upper and lower extremities. Cranial nerves II through XII are grossly intact. Proprioception is intact. No peripheral adenopathy or edema is identified. No motor or sensory levels are noted. Crude visual fields are within normal range.  RADIOLOGY RESULTS: No current films for review  PLAN: Present time patient is under excellent biochemical control of his  prostate cancer 2 years out I am pleased with his overall progress.  I have asked to see him back in 1 year for follow-up with a PSA prior to that visit.  Patient knows to call with any concerns.  I would like to take this opportunity to thank you for allowing me to participate in the care of your patient.Noreene Filbert, MD

## 2019-06-24 ENCOUNTER — Encounter: Payer: Self-pay | Admitting: Family Medicine

## 2019-06-29 ENCOUNTER — Telehealth: Payer: Self-pay | Admitting: Urology

## 2019-06-29 NOTE — Telephone Encounter (Signed)
App made and mailed °

## 2019-06-29 NOTE — Telephone Encounter (Signed)
-----   Message from Abbie Sons, MD sent at 06/29/2019  7:32 AM EDT ----- Regarding: Follow-up Please schedule follow-up visit March 2021

## 2019-07-08 ENCOUNTER — Encounter: Payer: Self-pay | Admitting: Gastroenterology

## 2019-07-11 DIAGNOSIS — Z20828 Contact with and (suspected) exposure to other viral communicable diseases: Secondary | ICD-10-CM | POA: Diagnosis not present

## 2019-07-17 ENCOUNTER — Encounter: Payer: Self-pay | Admitting: Gastroenterology

## 2019-08-12 ENCOUNTER — Ambulatory Visit (AMBULATORY_SURGERY_CENTER): Payer: PPO | Admitting: *Deleted

## 2019-08-12 ENCOUNTER — Other Ambulatory Visit: Payer: Self-pay

## 2019-08-12 ENCOUNTER — Encounter: Payer: Self-pay | Admitting: Gastroenterology

## 2019-08-12 VITALS — Temp 97.1°F | Ht 68.0 in | Wt 172.0 lb

## 2019-08-12 DIAGNOSIS — Z800 Family history of malignant neoplasm of digestive organs: Secondary | ICD-10-CM

## 2019-08-12 DIAGNOSIS — Z860100 Personal history of colon polyps, unspecified: Secondary | ICD-10-CM

## 2019-08-12 DIAGNOSIS — Z1159 Encounter for screening for other viral diseases: Secondary | ICD-10-CM

## 2019-08-12 MED ORDER — PLENVU 140 G PO SOLR: 1.0000 | ORAL | 0 refills | Status: DC

## 2019-08-12 NOTE — Progress Notes (Signed)
No egg or soy allergy known to patient  No issues with past sedation with any surgeries  or procedures, no intubation problems  No diet pills per patient No home 02 use per patient  No blood thinners per patient  Pt denies issues with constipation  No A fib or A flutter  EMMI video sent to pt's e mail   cov test 11-24 at 920 am   Due to the COVID-19 pandemic we are asking patients to follow these guidelines. Please only bring one care partner. Please be aware that your care partner may wait in the car in the parking lot or if they feel like they will be too hot to wait in the car, they may wait in the lobby on the 4th floor. All care partners are required to wear a mask the entire time (we do not have any that we can provide them), they need to practice social distancing, and we will do a Covid check for all patient's and care partners when you arrive. Also we will check their temperature and your temperature. If the care partner waits in their car they need to stay in the parking lot the entire time and we will call them on their cell phone when the patient is ready for discharge so they can bring the car to the front of the building. Also all patient's will need to wear a mask into building.  [facility] sample lot [id number]  Exp 12/2019

## 2019-08-18 ENCOUNTER — Ambulatory Visit (INDEPENDENT_AMBULATORY_CARE_PROVIDER_SITE_OTHER): Payer: PPO

## 2019-08-18 DIAGNOSIS — Z1159 Encounter for screening for other viral diseases: Secondary | ICD-10-CM | POA: Diagnosis not present

## 2019-08-19 LAB — SARS CORONAVIRUS 2 (TAT 6-24 HRS): SARS Coronavirus 2: NEGATIVE

## 2019-08-25 ENCOUNTER — Other Ambulatory Visit: Payer: Self-pay

## 2019-08-25 ENCOUNTER — Ambulatory Visit (AMBULATORY_SURGERY_CENTER): Payer: PPO | Admitting: Gastroenterology

## 2019-08-25 ENCOUNTER — Encounter: Payer: Self-pay | Admitting: Gastroenterology

## 2019-08-25 VITALS — BP 118/61 | HR 71 | Temp 98.7°F | Resp 13 | Ht 68.0 in | Wt 172.0 lb

## 2019-08-25 DIAGNOSIS — Z800 Family history of malignant neoplasm of digestive organs: Secondary | ICD-10-CM | POA: Diagnosis not present

## 2019-08-25 DIAGNOSIS — Z1211 Encounter for screening for malignant neoplasm of colon: Secondary | ICD-10-CM | POA: Diagnosis not present

## 2019-08-25 MED ORDER — SODIUM CHLORIDE 0.9 % IV SOLN: 500.0000 mL | Freq: Once | INTRAVENOUS | Status: DC

## 2019-08-25 NOTE — Patient Instructions (Signed)
YOU HAD AN ENDOSCOPIC PROCEDURE TODAY AT [facility]:   Refer to the procedure report that was given to you for any specific questions about what was found during the examination.  If the procedure report does not answer your questions, please call your gastroenterologist to clarify.  If you requested that your care partner not be given the details of your procedure findings, then the procedure report has been included in a sealed envelope for you to review at your convenience later.  YOU SHOULD EXPECT: Some feelings of bloating in the abdomen. Passage of more gas than usual.  Walking can help get rid of the air that was put into your GI tract during the procedure and reduce the bloating. If you had a lower endoscopy (such as a colonoscopy or flexible sigmoidoscopy) you may notice spotting of blood in your stool or on the toilet paper. If you underwent a bowel prep for your procedure, you may not have a normal bowel movement for a few days.  Please Note:  You might notice some irritation and congestion in your nose or some drainage.  This is from the oxygen used during your procedure.  There is no need for concern and it should clear up in a day or so.  SYMPTOMS TO REPORT IMMEDIATELY:   Following lower endoscopy (colonoscopy or flexible sigmoidoscopy):  Excessive amounts of blood in the stool  Significant tenderness or worsening of abdominal pains  Swelling of the abdomen that is new, acute  Fever of 100F or higher   For urgent or emergent issues, a gastroenterologist can be reached at any hour by calling [phone].   DIET:  We do recommend a small meal at first, but then you may proceed to your regular diet.  Drink plenty of fluids but you should avoid alcoholic beverages for 24 hours.  ACTIVITY:  You should plan to take it easy for the rest of today and you should NOT DRIVE or use heavy machinery until tomorrow (because of the sedation medicines used during the test).     FOLLOW UP: Our staff will call the number listed on your records 48-72 hours following your procedure to check on you and address any questions or concerns that you may have regarding the information given to you following your procedure. If we do not reach you, we will leave a message.  We will attempt to reach you two times.  During this call, we will ask if you have developed any symptoms of COVID 19. If you develop any symptoms (ie: fever, flu-like symptoms, shortness of breath, cough etc.) before then, please call [phone].  If you test positive for Covid 19 in the 2 weeks post procedure, please call and report this information to [place].    If any biopsies were taken you will be contacted by phone or by letter within the next 1-3 weeks.  Please call us at [phone] if you have not heard about the biopsies in 3 weeks.    SIGNATURES/CONFIDENTIALITY: You and/or your care partner have signed paperwork which will be entered into your electronic medical record.  These signatures attest to the fact that that the information above on your After Visit Summary has been reviewed and is understood.  Full responsibility of the confidentiality of this discharge information lies with you and/or your care-partner.    Handout was given to you on diverticulosis. You may resume your current medications today. Repeat colonoscopy in 5 years for screening purposes. Please call  if any questions or concerns.   

## 2019-08-25 NOTE — Progress Notes (Signed)
No problems noted in the recovery room. maw 

## 2019-08-25 NOTE — Progress Notes (Signed)
To PACU, VSS. Report to [email or web address] 

## 2019-08-25 NOTE — Op Note (Signed)
[place] Patient Name: Shane Stewart Procedure Date: 08/25/2019 1:21 PM MRN: [id number] Endoscopist: Mallie Mussel L. Loletha Carrow , MD Age: 73 Referring MD:  Date of Birth: [birth year] Gender: Male Account #: [id number] Procedure:                Colonoscopy Indications:              Screening in patient at increased risk: Colorectal                            cancer in father 31 or older (last colonoscopy 2015                            with single diminutive adenoma) Medicines:                Monitored Anesthesia Care Procedure:                Pre-Anesthesia Assessment:                           - Prior to the procedure, a History and Physical                            was performed, and patient medications and                            allergies were reviewed. The patient's tolerance of                            previous anesthesia was also reviewed. The risks                            and benefits of the procedure and the sedation                            options and risks were discussed with the patient.                            All questions were answered, and informed consent                            was obtained. Prior Anticoagulants: The patient has                            taken no previous anticoagulant or antiplatelet                            agents. ASA Grade Assessment: II - A patient with                            mild systemic disease. After reviewing the risks                            and benefits, the patient was deemed in  satisfactory condition to undergo the procedure.                           After obtaining informed consent, the colonoscope                            was passed under direct vision. Throughout the                            procedure, the patient's blood pressure, pulse, and                            oxygen saturations were monitored continuously. The                            Colonoscope was introduced  through the anus and                            advanced to the the cecum, identified by                            appendiceal orifice and ileocecal valve. The                            colonoscopy was performed without difficulty. The                            patient tolerated the procedure well. The quality                            of the bowel preparation was excellent. The                            ileocecal valve, appendiceal orifice, and rectum                            were photographed. Scope In: 1:27:21 PM Scope Out: 1:38:37 PM Scope Withdrawal Time: 0 hours 7 minutes 50 seconds  Total Procedure Duration: 0 hours 11 minutes 16 seconds  Findings:                 The perianal and digital rectal examinations were                            normal.                           Multiple diverticula were found in the left colon.                           The mucosa vascular pattern in the distal rectum                            was locally increased, consistent with radiation  proctopathy.                           The exam was otherwise without abnormality on                            direct and retroflexion views. Complications:            No immediate complications. Estimated Blood Loss:     Estimated blood loss: none. Impression:               - Diverticulosis in the left colon.                           - Increased mucosa vascular pattern in the distal                            rectum. Radiation proctopathy.                           - The examination was otherwise normal on direct                            and retroflexion views.                           - No specimens collected. Recommendation:           - Patient has a contact number available for                            emergencies. The signs and symptoms of potential                            delayed complications were discussed with the                            patient. Return to  normal activities tomorrow.                            Written discharge instructions were provided to the                            patient.                           - Resume previous diet.                           - Continue present medications.                           - Repeat colonoscopy in 5 years for screening                            purposes. Briyanna Billingham L. Loletha Carrow, MD 08/25/2019 1:43:17 PM This report has been signed electronically.

## 2019-08-25 NOTE — Progress Notes (Signed)
Pt's states no medical or surgical changes since previsit or office visit. 

## 2019-08-27 ENCOUNTER — Telehealth: Payer: Self-pay

## 2019-08-27 NOTE — Telephone Encounter (Signed)
  Follow up Call-  Call back number 08/25/2019 02/19/2018  Post procedure Call Back phone  # [phone]  Permission to leave phone message Yes Yes  Some recent data might be hidden     Patient questions:  Do you have a fever, pain , or abdominal swelling? No. Pain Score  0 *  Have you tolerated food without any problems? Yes.    Have you been able to return to your normal activities? Yes.    Do you have any questions about your discharge instructions: Diet   No. Medications  No. Follow up visit  No.  Do you have questions or concerns about your Care? No.  Actions: * If pain score is 4 or above: No action needed, pain <4.  1. Have you developed a fever since your procedure? No  2.   Have you had an respiratory symptoms (SOB or cough) since your procedure? No  3.   Have you tested positive for COVID 19 since your procedure No  4.   Have you had any family members/close contacts diagnosed with the COVID 19 since your procedure?  No   If yes to any of these questions please route to Joylene John, RN and Alphonsa Gin, RN.

## 2019-10-08 ENCOUNTER — Encounter: Payer: Self-pay | Admitting: Family Medicine

## 2019-11-06 ENCOUNTER — Ambulatory Visit: Payer: PPO | Attending: Internal Medicine

## 2019-11-06 ENCOUNTER — Encounter: Payer: Self-pay | Admitting: Family Medicine

## 2019-11-06 DIAGNOSIS — Z23 Encounter for immunization: Secondary | ICD-10-CM | POA: Insufficient documentation

## 2019-11-06 NOTE — Progress Notes (Signed)
   Covid-19 Vaccination Clinic  Name:  Shane Stewart    MRN: [id number] DOB: 01/04/1946  11/06/2019  Mr. Shane Stewart was observed post Covid-19 immunization for 15 minutes without incidence. He was provided with Vaccine Information Sheet and instruction to access the V-Safe system.   Mr. Shane Stewart was instructed to call 911 with any severe reactions post vaccine: Marland Kitchen Difficulty breathing  . Swelling of your face and throat  . A fast heartbeat  . A bad rash all over your body  . Dizziness and weakness    Immunizations Administered    Name Date Dose VIS Date Route   Pfizer COVID-19 Vaccine 11/06/2019  8:42 AM 0.3 mL 09/04/2019 Intramuscular   Manufacturer: [place]   Lot: [id number]   [place]: [id number]

## 2019-11-20 ENCOUNTER — Other Ambulatory Visit: Payer: Self-pay | Admitting: Family Medicine

## 2019-11-20 DIAGNOSIS — C61 Malignant neoplasm of prostate: Secondary | ICD-10-CM

## 2019-11-23 ENCOUNTER — Other Ambulatory Visit: Payer: PPO

## 2019-11-23 ENCOUNTER — Other Ambulatory Visit: Payer: Self-pay

## 2019-11-23 DIAGNOSIS — D229 Melanocytic nevi, unspecified: Secondary | ICD-10-CM | POA: Diagnosis not present

## 2019-11-23 DIAGNOSIS — D1801 Hemangioma of skin and subcutaneous tissue: Secondary | ICD-10-CM | POA: Diagnosis not present

## 2019-11-23 DIAGNOSIS — L821 Other seborrheic keratosis: Secondary | ICD-10-CM | POA: Diagnosis not present

## 2019-11-23 DIAGNOSIS — L578 Other skin changes due to chronic exposure to nonionizing radiation: Secondary | ICD-10-CM | POA: Diagnosis not present

## 2019-11-23 DIAGNOSIS — Z8546 Personal history of malignant neoplasm of prostate: Secondary | ICD-10-CM | POA: Diagnosis not present

## 2019-11-23 DIAGNOSIS — L853 Xerosis cutis: Secondary | ICD-10-CM | POA: Diagnosis not present

## 2019-11-23 DIAGNOSIS — C61 Malignant neoplasm of prostate: Secondary | ICD-10-CM

## 2019-11-23 DIAGNOSIS — L570 Actinic keratosis: Secondary | ICD-10-CM | POA: Diagnosis not present

## 2019-11-23 DIAGNOSIS — D225 Melanocytic nevi of trunk: Secondary | ICD-10-CM | POA: Diagnosis not present

## 2019-11-23 DIAGNOSIS — Z1283 Encounter for screening for malignant neoplasm of skin: Secondary | ICD-10-CM | POA: Diagnosis not present

## 2019-11-24 LAB — PSA: Prostate Specific Ag, Serum: <0.1 ng/mL (ref 0.0–4.0)

## 2019-11-26 ENCOUNTER — Encounter: Payer: Self-pay | Admitting: Urology

## 2019-11-26 ENCOUNTER — Ambulatory Visit: Payer: PPO | Admitting: Urology

## 2019-11-26 ENCOUNTER — Other Ambulatory Visit: Payer: Self-pay

## 2019-11-26 VITALS — BP 135/73 | HR 70 | Ht 68.0 in | Wt 165.0 lb

## 2019-11-26 DIAGNOSIS — C61 Malignant neoplasm of prostate: Secondary | ICD-10-CM

## 2019-11-26 NOTE — Progress Notes (Signed)
11/26/2019 1:06 PM   Shane Stewart [birth year] [id number]  Referring provider: Tonia Ghent, MD [address] [place],  [place] [address]  Chief Complaint  Patient presents with  . Prostate Cancer    Urologic problem list: - T2high risk prostate cancer diagnosed June 2018; PSA 6.3 and 1 cm nodule at right apex. Pathology:(Right apex w/ nodule on DRE) Gleason 4+4 = 8 in4% of the left lateral apex Gleason 4+3 = 7 in40% and 27% of the left lateral base and left medial mid Gleason 3+3 = 6 prostate cancer in7% of the left medial apex.  Underwent brachytherapy/IMRTcompleted 06/2017; along with androgen deprivation   HPI: 74 y.o. male presents for follow-up of prostate cancer.  -No problems since last visit -No bothersome lower urinary tract symptoms.  Remains on tamsulosin and does feel he gets benefit -PSA 11/23/2019 <0.1 -No dysuria, gross hematuria   PMH: Past Medical History:  Diagnosis Date  . Allergy    spring, fall  . Cancer [facility])    prostate  . Cataract 12/12/2017   right eye forming   . Diverticulosis of colon 01/1999  . History of CT scan 04/1985   L/S, H/P L5/S1  . Hyperlipidemia    currently ok   . Prostate cancer ([place])   . Right knee pain    better with hyaluronic acid  . Rosacea     Surgical History: Past Surgical History:  Procedure Laterality Date  . APPENDECTOMY    . COLONOSCOPY    . CYSTOSCOPY N/A 07/16/2017   Procedure: CYSTOSCOPY;  Surgeon: Hollice Espy, MD;  Location: [facility] ORS;  Service: Urology;  Laterality: N/A;  . LUMBAR SPINE SURGERY  1986   LS ruptured disc repair  . POLYPECTOMY    . RADIOACTIVE SEED IMPLANT N/A 07/16/2017   Procedure: RADIOACTIVE SEED IMPLANT/BRACHYTHERAPY IMPLANT;  Surgeon: Hollice Espy, MD;  Location: [facility] ORS;  Service: Urology;  Laterality: N/A;  . RADIOACTIVE SEED IMPLANT     Prostate- [facility]  . skin excision  2020   multiple   . TONSILLECTOMY    . UMBILICAL HERNIA  REPAIR  10/05/2005    Home Medications:  Allergies as of 11/26/2019   No Known Allergies     Medication List       Accurate as of November 26, 2019  1:06 PM. If you have any questions, ask your nurse or doctor.        STOP taking these medications   azelastine 0.1 % nasal spray Commonly known as: ASTELIN Stopped by: Abbie Sons, MD   fluticasone 50 MCG/ACT nasal spray Commonly known as: FLONASE Stopped by: Abbie Sons, MD     TAKE these medications   Cholecalciferol 50 MCG (2000 UT) Tabs Take 2,000 Units by mouth daily.   metroNIDAZOLE 0.75 % gel Commonly known as: METROGEL Apply 1 application topically daily.   multivitamin tablet Take 1 tablet by mouth daily.   tamsulosin 0.4 MG Caps capsule Commonly known as: FLOMAX TAKE 1 CAPSULE (0.4 MG TOTAL) BY MOUTH DAILY AFTER SUPPER.   vitamin B-12 1000 MCG tablet Commonly known as: CYANOCOBALAMIN Take 1,000 mcg by mouth daily.   ZINC PO Take by mouth.       Allergies: No Known Allergies  Family History: Family History  Problem Relation Age of Onset  . Alzheimer's disease Mother   . Dementia Mother   . Heart disease Father        CAD  . Diabetes Father   .  Cancer - Colon Father 26       colon  . Colon cancer Father   . Diabetes Sister   . Heart disease Sister        MI CABG x 3  . Hypertension Sister   . Prostate cancer Neg Hx   . Colon polyps Neg Hx   . Esophageal cancer Neg Hx   . Rectal cancer Neg Hx   . Stomach cancer Neg Hx     Social History:  reports that he has never smoked. He has never used smokeless tobacco. He reports that he does not drink alcohol or use drugs.   Physical Exam: BP 135/73   Pulse 70   Ht 5\' 8"  (1.727 m)   Wt 165 lb (74.8 kg)   BMI 25.09 kg/m   Constitutional:  Alert and oriented, No acute distress. HEENT: [place] AT, moist mucus membranes.  Trachea midline, no masses. Cardiovascular: No clubbing, cyanosis, or edema. Respiratory: Normal respiratory effort, no  increased work of breathing.    Assessment & Plan:    -T2 high risk prostate cancer status post EBRT/brachytherapy/ADT Doing well, undetectable PSA.  Has radiation oncology follow-up September 2021.  Will see him back in 1 year with a PSA.    Abbie Sons, [place] [address] [place], [place] [address] [phone]

## 2019-12-02 ENCOUNTER — Ambulatory Visit: Payer: PPO | Attending: Internal Medicine

## 2019-12-02 DIAGNOSIS — Z23 Encounter for immunization: Secondary | ICD-10-CM | POA: Insufficient documentation

## 2019-12-02 NOTE — Progress Notes (Signed)
   Covid-19 Vaccination Clinic  Name:  Shane Stewart    MRN: [id number] DOB: 28-Jul-1946  12/02/2019  Shane Stewart was observed post Covid-19 immunization for 15 minutes without incident. He was provided with Vaccine Information Sheet and instruction to access the V-Safe system.   Shane Stewart was instructed to call 911 with any severe reactions post vaccine: Marland Kitchen Difficulty breathing  . Swelling of face and throat  . A fast heartbeat  . A bad rash all over body  . Dizziness and weakness   Immunizations Administered    Name Date Dose VIS Date Route   Pfizer COVID-19 Vaccine 12/02/2019  8:10 AM 0.3 mL 09/04/2019 Intramuscular   Manufacturer: [place]   Lot: [id number]   [place]: [id number]

## 2020-01-16 ENCOUNTER — Other Ambulatory Visit: Payer: Self-pay | Admitting: Radiation Oncology

## 2020-01-25 ENCOUNTER — Encounter: Payer: Self-pay | Admitting: Dermatology

## 2020-01-25 ENCOUNTER — Other Ambulatory Visit: Payer: Self-pay

## 2020-01-25 ENCOUNTER — Ambulatory Visit: Payer: PPO | Admitting: Dermatology

## 2020-01-25 DIAGNOSIS — L578 Other skin changes due to chronic exposure to nonionizing radiation: Secondary | ICD-10-CM

## 2020-01-25 DIAGNOSIS — L570 Actinic keratosis: Secondary | ICD-10-CM

## 2020-01-25 NOTE — Progress Notes (Signed)
   Follow-Up Visit   Subjective  Shane Stewart is a 74 y.o. male who presents for the following: Actinic Keratosis (previously treated with LN2 - L temple, L ear, R temple/zygoma).  The following portions of the chart were reviewed this encounter and updated as appropriate:  Tobacco  Allergies  Meds  Problems  Med Hx  Surg Hx  Fam Hx     Review of Systems:  No other skin or systemic complaints except as noted in HPI or Assessment and Plan.  Objective  Well appearing patient in no apparent distress; mood and affect are within normal limits.  A focused examination was performed including the face, ears, and scalp. Relevant physical exam findings are noted in the Assessment and Plan.  Objective  R temple x 1, R ear x 1, R cheek x 1, L temple x 1, L cheek x 2, R nasal bridge x 1 (11): Erythematous thin papules/macules with gritty scale.    Assessment & Plan  AK (actinic keratosis) (11) R temple x 1, R ear x 1, R cheek x 1, L temple x 1, L cheek x 2, R nasal bridge x 1  Destruction of lesion - R temple x 1, R ear x 1, R cheek x 1, L temple x 1, L cheek x 2, R nasal bridge x 1 Complexity: simple   Destruction method: cryotherapy   Informed consent: discussed and consent obtained   Timeout:  patient name, date of birth, surgical site, and procedure verified Lesion destroyed using liquid nitrogen: Yes   Region frozen until ice ball extended beyond lesion: Yes   Outcome: patient tolerated procedure well with no complications   Post-procedure details: wound care instructions given     Actinic Damage - diffuse scaly erythematous macules with underlying dyspigmentation - Recommend daily broad spectrum sunscreen SPF 30+ to sun-exposed areas, reapply every 2 hours as needed.  - Call for new or changing lesions.   Return in about 6 months (around 07/27/2020) for AK recheck .  Luther Redo, CMA, am acting as scribe for Sarina Ser, MD .  Documentation: I have reviewed the  above documentation for accuracy and completeness, and I agree with the above.  Sarina Ser, MD

## 2020-02-23 ENCOUNTER — Other Ambulatory Visit: Payer: Self-pay | Admitting: Family Medicine

## 2020-02-23 DIAGNOSIS — Z8639 Personal history of other endocrine, nutritional and metabolic disease: Secondary | ICD-10-CM

## 2020-02-23 DIAGNOSIS — R7309 Other abnormal glucose: Secondary | ICD-10-CM

## 2020-02-23 DIAGNOSIS — E7800 Pure hypercholesterolemia, unspecified: Secondary | ICD-10-CM

## 2020-03-03 ENCOUNTER — Other Ambulatory Visit (INDEPENDENT_AMBULATORY_CARE_PROVIDER_SITE_OTHER): Payer: PPO

## 2020-03-03 DIAGNOSIS — E7800 Pure hypercholesterolemia, unspecified: Secondary | ICD-10-CM | POA: Diagnosis not present

## 2020-03-03 DIAGNOSIS — Z8639 Personal history of other endocrine, nutritional and metabolic disease: Secondary | ICD-10-CM | POA: Diagnosis not present

## 2020-03-03 DIAGNOSIS — R7309 Other abnormal glucose: Secondary | ICD-10-CM

## 2020-03-03 LAB — COMPREHENSIVE METABOLIC PANEL
ALT: 15 U/L (ref 0–53)
AST: 17 U/L (ref 0–37)
Albumin: 4.1 g/dL (ref 3.5–5.2)
Alkaline Phosphatase: 89 U/L (ref 39–117)
BUN: 20 mg/dL (ref 6–23)
CO2: 30 mEq/L (ref 19–32)
Calcium: 8.8 mg/dL (ref 8.4–10.5)
Chloride: 104 mEq/L (ref 96–112)
Creatinine, Ser: 0.9 mg/dL (ref 0.40–1.50)
GFR: 82.73 mL/min (ref >60.00)
Glucose, Bld: 99 mg/dL (ref 70–99)
Potassium: 4 mEq/L (ref 3.5–5.1)
Sodium: 139 mEq/L (ref 135–145)
Total Bilirubin: 0.8 mg/dL (ref 0.2–1.2)
Total Protein: 6.2 g/dL (ref 6.0–8.3)

## 2020-03-03 LAB — LIPID PANEL
Cholesterol: 169 mg/dL (ref 0–200)
HDL: 60.5 mg/dL (ref >39.00)
LDL Cholesterol: 100 mg/dL — ABNORMAL HIGH (ref 0–99)
NonHDL: 108.83
Total CHOL/HDL Ratio: 3
Triglycerides: 44 mg/dL (ref 0.0–149.0)
VLDL: 8.8 mg/dL (ref 0.0–40.0)

## 2020-03-03 LAB — VITAMIN D 25 HYDROXY (VIT D DEFICIENCY, FRACTURES): VITD: 34.09 ng/mL (ref 30.00–100.00)

## 2020-03-04 LAB — HEMOGLOBIN A1C: Hgb A1c MFr Bld: 5.7 % (ref 4.6–6.5)

## 2020-03-09 ENCOUNTER — Ambulatory Visit: Payer: PPO

## 2020-03-10 ENCOUNTER — Ambulatory Visit (INDEPENDENT_AMBULATORY_CARE_PROVIDER_SITE_OTHER): Payer: PPO | Admitting: Family Medicine

## 2020-03-10 ENCOUNTER — Other Ambulatory Visit: Payer: Self-pay

## 2020-03-10 ENCOUNTER — Encounter: Payer: Self-pay | Admitting: Family Medicine

## 2020-03-10 VITALS — BP 110/58 | HR 62 | Temp 97.2°F | Ht 68.0 in | Wt 165.4 lb

## 2020-03-10 DIAGNOSIS — L719 Rosacea, unspecified: Secondary | ICD-10-CM

## 2020-03-10 DIAGNOSIS — C61 Malignant neoplasm of prostate: Secondary | ICD-10-CM

## 2020-03-10 DIAGNOSIS — Z0000 Encounter for general adult medical examination without abnormal findings: Secondary | ICD-10-CM

## 2020-03-10 DIAGNOSIS — Z7189 Other specified counseling: Secondary | ICD-10-CM

## 2020-03-10 MED ORDER — METRONIDAZOLE 0.75 % EX GEL: 1.0000 "application " | Freq: Every day | CUTANEOUS | 12 refills | Status: DC

## 2020-03-10 NOTE — Progress Notes (Signed)
This visit occurred during the SARS-CoV-2 public health emergency.  Safety protocols were in place, including screening questions prior to the visit, additional usage of staff PPE, and extensive cleaning of exam room while observing appropriate contact time as indicated for disinfecting solutions.  He is going to f/u with Dewitt Hoes about phone visit next week.    He isn't lightheaded.    He is still volunteering.  He is still active, walking 4 miles a day.  He is back at the gym.    covid vaccine 2021 Tetanus 2011 Shingles up to date.  PNA up to date.   PSA per urology.  He agrees.  Colonoscopy 2020 Advance directive- would have his friend [name] designated if patient were incapacitated.  Diet and exercise d/w pt.    He is weaning off flomax.  He has some nocturia but that is getting better.  Some nights w/o any urination.  Stream is good.  No FCNAVD.  No blood seen in urine.    Using metrogel daily w/o ADE.  D/w pt.  Refill done.  It helps.  PMH and SH reviewed  ROS: Per HPI unless specifically indicated in ROS section   Meds, vitals, and allergies reviewed.

## 2020-03-10 NOTE — Patient Instructions (Addendum)
Check with your insurance to see if they will cover the tetanus shot. Thank you for your effort.  Update me as needed.  Take care.  Glad to see you.

## 2020-03-11 ENCOUNTER — Ambulatory Visit: Payer: PPO

## 2020-03-13 NOTE — Assessment & Plan Note (Signed)
covid vaccine 2021 Tetanus 2011 Shingles up to date.  PNA up to date.   PSA per urology.  He agrees.  Colonoscopy 2020 Advance directive- would have his friend [name] designated if patient were incapacitated.  Diet and exercise d/w pt.

## 2020-03-13 NOTE — Assessment & Plan Note (Signed)
Advance directive- would have his friend [name] designated if patient were incapacitated.

## 2020-03-13 NOTE — Assessment & Plan Note (Signed)
Using metrogel daily w/o ADE.  D/w pt.  Refill done.

## 2020-03-13 NOTE — Assessment & Plan Note (Signed)
Per urology. He is weaning off flomax.  He has some nocturia but that is getting better.  Some nights w/o any urination.  Stream is good.  No FCNAVD.  No blood seen in urine.

## 2020-03-24 ENCOUNTER — Ambulatory Visit (INDEPENDENT_AMBULATORY_CARE_PROVIDER_SITE_OTHER): Payer: PPO

## 2020-03-24 DIAGNOSIS — Z0000 Encounter for general adult medical examination without abnormal findings: Secondary | ICD-10-CM | POA: Diagnosis not present

## 2020-03-24 NOTE — Progress Notes (Signed)
PCP notes:  Health Maintenance: Tdap- insurance/financial   Abnormal Screenings: none   Patient concerns: none   Nurse concerns: none   Next PCP appt.: none 

## 2020-03-24 NOTE — Patient Instructions (Signed)
Shane Stewart , Thank you for taking time to come for your Medicare Wellness Visit. I appreciate your ongoing commitment to your health goals. Please review the following plan we discussed and let me know if I can assist you in the future.   Screening recommendations/referrals: Colonoscopy: Up to date, completed 08/25/2019, due 08/2024 Recommended yearly ophthalmology/optometry visit for glaucoma screening and checkup Recommended yearly dental visit for hygiene and checkup  Vaccinations: Influenza vaccine: Up to date, completed 06/26/2019, due 04/2020 Pneumococcal vaccine: Completed series Tdap vaccine: decline- insurance/financial Shingles vaccine: Completed series   Covid-19: Completed series  Advanced directives: Please bring a copy of your POA (Power of Attorney) and/or Living Will to your next appointment.   Conditions/risks identified: none  Next appointment: Follow up in one year for your annual wellness visit.   Preventive Care 73 Years and Older, Male Preventive care refers to lifestyle choices and visits with your health care provider that can promote health and wellness. What does preventive care include?  A yearly physical exam. This is also called an annual well check.  Dental exams once or twice a year.  Routine eye exams. Ask your health care provider how often you should have your eyes checked.  Personal lifestyle choices, including:  Daily care of your teeth and gums.  Regular physical activity.  Eating a healthy diet.  Avoiding tobacco and drug use.  Limiting alcohol use.  Practicing safe sex.  Taking low doses of aspirin every day.  Taking vitamin and mineral supplements as recommended by your health care provider. What happens during an annual well check? The services and screenings done by your health care provider during your annual well check will depend on your age, overall health, lifestyle risk factors, and family history of disease. Counseling    Your health care provider may ask you questions about your:  Alcohol use.  Tobacco use.  Drug use.  Emotional well-being.  Home and relationship well-being.  Sexual activity.  Eating habits.  History of falls.  Memory and ability to understand (cognition).  Work and work Statistician. Screening  You may have the following tests or measurements:  Height, weight, and BMI.  Blood pressure.  Lipid and cholesterol levels. These may be checked every 5 years, or more frequently if you are over 73 years old.  Skin check.  Lung cancer screening. You may have this screening every year starting at age 73 if you have a 30-pack-year history of smoking and currently smoke or have quit within the past 15 years.  Fecal occult blood test (FOBT) of the stool. You may have this test every year starting at age 73.  Flexible sigmoidoscopy or colonoscopy. You may have a sigmoidoscopy every 5 years or a colonoscopy every 10 years starting at age 73.  Prostate cancer screening. Recommendations will vary depending on your family history and other risks.  Hepatitis C blood test.  Hepatitis B blood test.  Sexually transmitted disease (STD) testing.  Diabetes screening. This is done by checking your blood sugar (glucose) after you have not eaten for a while (fasting). You may have this done every 1-3 years.  Abdominal aortic aneurysm (AAA) screening. You may need this if you are a current or former smoker.  Osteoporosis. You may be screened starting at age 73 if you are at high risk. Talk with your health care provider about your test results, treatment options, and if necessary, the need for more tests. Vaccines  Your health care provider may recommend certain vaccines,  such as:  Influenza vaccine. This is recommended every year.  Tetanus, diphtheria, and acellular pertussis (Tdap, Td) vaccine. You may need a Td booster every 10 years.  Zoster vaccine. You may need this after age  73.  Pneumococcal 13-valent conjugate (PCV13) vaccine. One dose is recommended after age 73.  Pneumococcal polysaccharide (PPSV23) vaccine. One dose is recommended after age 73. Talk to your health care provider about which screenings and vaccines you need and how often you need them. This information is not intended to replace advice given to you by your health care provider. Make sure you discuss any questions you have with your health care provider. Document Released: 10/07/2015 Document Revised: 05/30/2016 Document Reviewed: 07/12/2015 Elsevier Interactive Patient Education  2017 [place] Prevention in the Home Falls can cause injuries. They can happen to people of all ages. There are many things you can do to make your home safe and to help prevent falls. What can I do on the outside of my home?  Regularly fix the edges of walkways and driveways and fix any cracks.  Remove anything that might make you trip as you walk through a door, such as a raised step or threshold.  Trim any bushes or trees on the path to your home.  Use bright outdoor lighting.  Clear any walking paths of anything that might make someone trip, such as rocks or tools.  Regularly check to see if handrails are loose or broken. Make sure that both sides of any steps have handrails.  Any raised decks and porches should have guardrails on the edges.  Have any leaves, snow, or ice cleared regularly.  Use sand or salt on walking paths during winter.  Clean up any spills in your garage right away. This includes oil or grease spills. What can I do in the bathroom?  Use night lights.  Install grab bars by the toilet and in the tub and shower. Do not use towel bars as grab bars.  Use non-skid mats or decals in the tub or shower.  If you need to sit down in the shower, use a plastic, non-slip stool.  Keep the floor dry. Clean up any water that spills on the floor as soon as it happens.  Remove  soap buildup in the tub or shower regularly.  Attach bath mats securely with double-sided non-slip rug tape.  Do not have throw rugs and other things on the floor that can make you trip. What can I do in the bedroom?  Use night lights.  Make sure that you have a light by your bed that is easy to reach.  Do not use any sheets or blankets that are too big for your bed. They should not hang down onto the floor.  Have a firm chair that has side arms. You can use this for support while you get dressed.  Do not have throw rugs and other things on the floor that can make you trip. What can I do in the kitchen?  Clean up any spills right away.  Avoid walking on wet floors.  Keep items that you use a lot in easy-to-reach places.  If you need to reach something above you, use a strong step stool that has a grab bar.  Keep electrical cords out of the way.  Do not use floor polish or wax that makes floors slippery. If you must use wax, use non-skid floor wax.  Do not have throw rugs and other things on  the floor that can make you trip. What can I do with my stairs?  Do not leave any items on the stairs.  Make sure that there are handrails on both sides of the stairs and use them. Fix handrails that are broken or loose. Make sure that handrails are as long as the stairways.  Check any carpeting to make sure that it is firmly attached to the stairs. Fix any carpet that is loose or worn.  Avoid having throw rugs at the top or bottom of the stairs. If you do have throw rugs, attach them to the floor with carpet tape.  Make sure that you have a light switch at the top of the stairs and the bottom of the stairs. If you do not have them, ask someone to add them for you. What else can I do to help prevent falls?  Wear shoes that:  Do not have high heels.  Have rubber bottoms.  Are comfortable and fit you well.  Are closed at the toe. Do not wear sandals.  If you use a  stepladder:  Make sure that it is fully opened. Do not climb a closed stepladder.  Make sure that both sides of the stepladder are locked into place.  Ask someone to hold it for you, if possible.  Clearly mark and make sure that you can see:  Any grab bars or handrails.  First and last steps.  Where the edge of each step is.  Use tools that help you move around (mobility aids) if they are needed. These include:  Canes.  Walkers.  Scooters.  Crutches.  Turn on the lights when you go into a dark area. Replace any light bulbs as soon as they burn out.  Set up your furniture so you have a clear path. Avoid moving your furniture around.  If any of your floors are uneven, fix them.  If there are any pets around you, be aware of where they are.  Review your medicines with your doctor. Some medicines can make you feel dizzy. This can increase your chance of falling. Ask your doctor what other things that you can do to help prevent falls. This information is not intended to replace advice given to you by your health care provider. Make sure you discuss any questions you have with your health care provider. Document Released: 07/07/2009 Document Revised: 02/16/2016 Document Reviewed: 10/15/2014 Elsevier Interactive Patient Education  2017 [employer].

## 2020-03-24 NOTE — Progress Notes (Signed)
Subjective:   Shane Stewart is a 74 y.o. male who presents for Medicare Annual/Subsequent preventive examination.  Review of Systems: N/A      I connected with the patient today by telephone and verified that I am speaking with the correct person using two identifiers. Location patient: home Location nurse: work Persons participating in the virtual visit: patient, Marine scientist.   I discussed the limitations, risks, security and privacy concerns of performing an evaluation and management service by telephone and the availability of in person appointments. I also discussed with the patient that there may be a patient responsible charge related to this service. The patient expressed understanding and verbally consented to this telephonic visit.    Interactive audio and video telecommunications were attempted between this nurse and patient, however failed, due to patient having technical difficulties OR patient did not have access to video capability.  We continued and completed visit with audio only.     Cardiac Risk Factors include: advanced age (>44men, >63 women);male gender     Objective:    Today's Vitals   There is no height or weight on file to calculate BMI.  Advanced Directives 03/24/2020 08/25/2019 06/18/2019 02/25/2019 06/19/2018 02/19/2018 12/12/2017  Does Patient Have a Medical Advance Directive? Yes Yes No Yes No Yes Yes  Type of Paramedic of [place];Living will [place];Living will - [place];Living will - [place];Living will [place];Living will  Does patient want to make changes to medical advance directive? - - - - - No - Patient declined No - Patient declined  Copy of [place] in Chart? No - copy requested - - No - copy requested - No - copy requested No - copy requested  Would patient like information on creating a medical advance directive? - - No -  Patient declined - No - Patient declined - No - Patient declined    Current Medications (verified) Outpatient Encounter Medications as of 03/24/2020  Medication Sig  . Cholecalciferol 2000 units TABS Take 2,000 Units by mouth daily.  . metroNIDAZOLE (METROGEL) 0.75 % gel Apply 1 application topically daily.  . Multiple Vitamin (MULTIVITAMIN) tablet Take 1 tablet by mouth daily.  . tamsulosin (FLOMAX) 0.4 MG CAPS capsule TAKE 1 CAPSULE (0.4 MG TOTAL) BY MOUTH DAILY AFTER SUPPER.  . vitamin B-12 (CYANOCOBALAMIN) 1000 MCG tablet Take 1,000 mcg by mouth daily.   No facility-administered encounter medications on file as of 03/24/2020.    Allergies (verified) Patient has no known allergies.   History: Past Medical History:  Diagnosis Date  . Allergy    spring, fall  . Cancer [facility])    prostate  . Cataract 12/12/2017   right eye forming   . Diverticulosis of colon 01/1999  . History of CT scan 04/1985   L/S, H/P L5/S1  . Hyperlipidemia    currently ok   . Prostate cancer ([place])   . Right knee pain    better with hyaluronic acid  . Rosacea    Past Surgical History:  Procedure Laterality Date  . APPENDECTOMY    . COLONOSCOPY    . CYSTOSCOPY N/A 07/16/2017   Procedure: CYSTOSCOPY;  Surgeon: Hollice Espy, MD;  Location: [facility] ORS;  Service: Urology;  Laterality: N/A;  . LUMBAR SPINE SURGERY  1986   LS ruptured disc repair  . POLYPECTOMY    . RADIOACTIVE SEED IMPLANT N/A 07/16/2017   Procedure: RADIOACTIVE SEED IMPLANT/BRACHYTHERAPY IMPLANT;  Surgeon: Hollice Espy, MD;  Location: [facility] ORS;  Service: Urology;  Laterality: N/A;  . RADIOACTIVE SEED IMPLANT     Prostate- [facility]  . skin excision  2020   multiple   . TONSILLECTOMY    . UMBILICAL HERNIA REPAIR  10/05/2005   Family History  Problem Relation Age of Onset  . Alzheimer's disease Mother   . Dementia Mother   . Heart disease Father        CAD  . Diabetes Father   . Cancer - Colon Father 26        colon  . Colon cancer Father   . Diabetes Sister   . Heart disease Sister        MI CABG x 3  . Hypertension Sister   . Prostate cancer Neg Hx   . Colon polyps Neg Hx   . Esophageal cancer Neg Hx   . Rectal cancer Neg Hx   . Stomach cancer Neg Hx    Social History   Socioeconomic History  . Marital status: Widowed    Spouse name: Not on file  . Number of children: 0  . Years of education: Not on file  . Highest education level: Not on file  Occupational History  . Occupation: MGR//Contractor Interior and spatial designer: retired  Tobacco Use  . Smoking status: Never Smoker  . Smokeless tobacco: Never Used  Vaping Use  . Vaping Use: Never used  Substance and Sexual Activity  . Alcohol use: No  . Drug use: No  . Sexual activity: Yes  Other Topics Concern  . 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 Difficulty of Paying Living Expenses:   Food Insecurity:   . Worried About Charity fundraiser in the Last Year:   . Arboriculturist in the Last Year:   Transportation Needs:   . Film/video editor (Medical):   Marland Kitchen Lack of Transportation (Non-Medical):   Physical Activity:   . Days of Exercise per Week:   . Minutes of Exercise per Session:   Stress:   . Feeling of Stress :   Social Connections:   . Frequency of Communication with Friends and Family:   . Frequency of Social Gatherings with Friends and Family:   . Attends Religious Services:   . Active Member of Clubs or Organizations:   . Attends Archivist Meetings:   Marland Kitchen Marital Status:     Tobacco Counseling Counseling given: Not Answered   Clinical Intake:  Pre-visit preparation completed: Yes  Pain :  No/denies pain     Nutritional Risks: None Diabetes: No  How often do you need to have someone help you when you read instructions, pamphlets, or other written materials from your doctor or pharmacy?: 1 - Never What is the last grade level you completed in school?: graduate school  Diabetic: No Nutrition Risk Assessment:  Has the patient had any N/V/D within the last 2 months?  No  Does the patient have any non-healing wounds?  No  Has the patient had any unintentional weight loss or weight gain?  No   Diabetes:  Is the patient diabetic?  No  If diabetic, was a CBG obtained today?  No  Did the patient bring in their glucometer from home?  No  How often do you monitor your CBG's? N/A.   Financial Strains and Diabetes Management:  Are you having any financial strains with the device, your supplies or your medication? No .  Does the patient want to be seen by Chronic Care Management for management of their diabetes?  No  Would the patient like to be referred to a Nutritionist or for Diabetic Management?  No    Interpreter Needed?: No  Information entered by :: CJohnson, LPN   Activities of Daily Living In your present state of health, do you have any difficulty performing the following activities: 03/24/2020  Hearing? N  Vision? N  Difficulty concentrating or making decisions? N  Walking or climbing stairs? N  Dressing or bathing? N  Doing errands, shopping? N  Preparing Food and eating ? N  Using the Toilet? N  In the past six months, have you accidently leaked urine? N  Do you have problems with loss of bowel control? N  Managing your Medications? N  Managing your Finances? N  Housekeeping or managing your Housekeeping? N  Some recent data might be hidden    Patient Care Team: Tonia Ghent, MD as PCP - General (Family Medicine) Leandrew Koyanagi, MD as Referring Physician (Ophthalmology) Abbie Sons, MD (Urology)  Indicate any recent Medical  Services you may have received from other than [facility] providers in the past year (date may be approximate).     Assessment:   This is a routine wellness examination for [place].  Hearing/Vision screen  Hearing Screening   125Hz  250Hz  500Hz  1000Hz  2000Hz  3000Hz  4000Hz  6000Hz  8000Hz   Right ear:           Left ear:           Vision Screening Comments: Patient gets annual eye exams  Dietary issues and exercise activities discussed: Current Exercise Habits: Home exercise routine, Type of exercise: walking, Time (Minutes): 60, Frequency (Times/Week): 7, Weekly Exercise (Minutes/Week): 420, Intensity: Moderate, Exercise limited by: None identified  Goals    . Increase physical activity     Weather permitting, I will continue to walk for 4-5 miles daily.     . Patient Stated     03/24/2020, I will continue to walk 4 miles everyday.       Depression Screen PHQ 2/9 Scores 03/24/2020 02/25/2019 12/12/2017 11/25/2017 12/10/2016 12/05/2016 12/08/2015  PHQ - 2 Score 0 0 0 0 0 0 0  PHQ- 9 Score 0 0 0 - - - -    Fall Risk Fall Risk  03/24/2020 02/25/2019 12/12/2017 11/25/2017 12/10/2016  Falls in the past year? 0 0 No No No  Number falls in past yr: 0 - - - -  Injury with Fall? 0 - - - -  Risk for fall due to : Medication side effect - - - -  Follow up Falls evaluation completed;Falls prevention discussed - - - -    Any stairs in or around the home? Yes  If so, are there any without handrails? No  Home free of loose throw rugs in walkways, pet beds, electrical cords, etc? Yes  Adequate lighting in your home to reduce risk of falls? Yes   ASSISTIVE DEVICES UTILIZED TO PREVENT FALLS:  Life alert? No  Use of a cane, walker or w/c? No  Grab bars in the bathroom?  No  Shower chair or bench in shower? No  Elevated toilet seat or a handicapped toilet? No   TIMED UP AND GO:  Was the test performed? N/A, telephonic visit.    Cognitive Function: MMSE - Mini Mental State Exam 03/24/2020 02/25/2019 12/12/2017  12/05/2016  Not completed: Refused - - -  Orientation to time - 5 5 5   Orientation to Place - 5 5 5   Registration - 3 3 3   Attention/ Calculation - 0 0 0  Recall - 3 1 3   Recall-comments - - unable to recall 2 of 3 words -  Language- name 2 objects - 0 0 0  Language- repeat - 1 1 1   Language- follow 3 step command - 0 3 3  Language- read & follow direction - 0 0 0  Write a sentence - 0 0 0  Copy design - 0 0 0  Total score - 17 18 20   Mini Cog  Mini-Cog screen was not completed. Patient refused. Maximum score is 22. A value of 0 denotes this part of the MMSE was not completed or the patient failed this part of the Mini-Cog screening.       Immunizations Immunization History  Administered Date(s) Administered  . H1N1 10/25/2008  . Hepatitis A 09/22/2008, 03/24/2009  . Hepatitis B 09/22/2008, 10/25/2008, 03/24/2009  . Influenza Whole 07/08/2002, 07/07/2008, 07/19/2010  . Influenza, High Dose Seasonal PF 06/28/2017, 06/26/2019  . Influenza,inj,Quad PF,6+ Mos 07/09/2013, 06/28/2017  . Influenza-Unspecified 07/06/2014, 06/28/2015, 06/24/2016, 06/28/2017  . PFIZER SARS-COV-2 Vaccination 11/06/2019, 12/02/2019  . Pneumococcal Conjugate-13 10/21/2012, 12/07/2014  . Pneumococcal Polysaccharide-23 12/08/2015  . Td 02/15/2003, 10/25/2009  . Tdap 11/28/2009  . Zoster 03/24/2013  . Zoster Recombinat (Shingrix) 06/24/2019, 08/24/2019    TDAP status: Due, Education has been provided regarding the importance of this vaccine. Advised may receive this vaccine at local pharmacy or Health Dept. Aware to provide a copy of the vaccination record if obtained from local pharmacy or Health Dept. Verbalized acceptance and understanding. Flu Vaccine status: Up to date Pneumococcal vaccine status: Up to date Covid-19 vaccine status: Completed vaccines  Qualifies for Shingles Vaccine? Yes   Zostavax completed Yes   Shingrix Completed?: Yes  Screening Tests Health Maintenance  Topic Date Due  .  TETANUS/TDAP  03/24/2021 (Originally 11/29/2019)  . INFLUENZA VACCINE  04/24/2020  . COLONOSCOPY  08/24/2024  . COVID-19 Vaccine  Completed  . Hepatitis C Screening  Completed  . PNA vac Low Risk Adult  Completed    Health Maintenance  There are no preventive care reminders to display for this patient.  Colorectal cancer screening: Completed 08/25/2019. Repeat every 5 years  Lung Cancer Screening: (Low Dose CT Chest recommended if Age 60-80 years, 30 pack-year currently smoking OR have quit w/in 15years.) does not qualify.    Additional Screening:  Hepatitis C Screening: does qualify; Completed 12/05/2016  Vision Screening: Recommended annual ophthalmology exams for early detection of glaucoma and other disorders of the eye. Is the patient up to date with their annual eye exam?  Yes  Who is the provider or what is the name of the office in which the patient attends annual eye exams? [facility] If pt is not established with a provider, would they like to be referred to a provider to establish care? No .   Dental Screening: Recommended annual dental exams for proper oral hygiene  Community Resource Referral / Chronic Care Management: CRR required this visit?  No   CCM required this  visit?  No      Plan:     I have personally reviewed and noted the following in the patient's chart:   . Medical and social history . Use of alcohol, tobacco or illicit drugs  . Current medications and supplements . Functional ability and status . Nutritional status . Physical activity . Advanced directives . List of other physicians . Hospitalizations, surgeries, and ER visits in previous 12 months . Vitals . Screenings to include cognitive, depression, and falls . Referrals and appointments  In addition, I have reviewed and discussed with patient certain preventive protocols, quality metrics, and best practice recommendations. A written personalized care plan for preventive  services as well as general preventive health recommendations were provided to patient.   Due to this being a telephonic visit, the after visit summary with patients personalized plan was offered to patient via mail or my-chart. Patient preferred to pick up at office at next visit.   Andrez Grime, LPN   12/26/100

## 2020-03-31 DIAGNOSIS — D3132 Benign neoplasm of left choroid: Secondary | ICD-10-CM | POA: Diagnosis not present

## 2020-03-31 DIAGNOSIS — H2513 Age-related nuclear cataract, bilateral: Secondary | ICD-10-CM | POA: Diagnosis not present

## 2020-05-30 ENCOUNTER — Encounter: Payer: Self-pay | Admitting: Family Medicine

## 2020-05-31 NOTE — Telephone Encounter (Signed)
Do I accept the patient's word on this and document it>?

## 2020-06-01 NOTE — Telephone Encounter (Signed)
Yes.  Please document it and put in the comments " patient reported".  Thanks.

## 2020-06-16 ENCOUNTER — Other Ambulatory Visit: Payer: Self-pay

## 2020-06-16 ENCOUNTER — Inpatient Hospital Stay: Payer: PPO | Attending: Radiation Oncology

## 2020-06-16 DIAGNOSIS — C61 Malignant neoplasm of prostate: Secondary | ICD-10-CM | POA: Diagnosis not present

## 2020-06-16 LAB — PSA: Prostatic Specific Antigen: 0.03 ng/mL (ref 0.00–4.00)

## 2020-06-23 ENCOUNTER — Ambulatory Visit
Admission: RE | Admit: 2020-06-23 | Discharge: 2020-06-23 | Disposition: A | Discharge status: Home / Self Care | Payer: PPO | Source: Ambulatory Visit | Attending: Radiation Oncology | Admitting: Radiation Oncology

## 2020-06-23 ENCOUNTER — Other Ambulatory Visit: Payer: Self-pay | Admitting: Licensed Clinical Social Worker

## 2020-06-23 ENCOUNTER — Other Ambulatory Visit: Payer: Self-pay

## 2020-06-23 ENCOUNTER — Encounter: Payer: Self-pay | Admitting: Radiation Oncology

## 2020-06-23 VITALS — BP 120/74 | HR 70 | Temp 95.7°F | Wt 164.0 lb

## 2020-06-23 DIAGNOSIS — C61 Malignant neoplasm of prostate: Secondary | ICD-10-CM | POA: Diagnosis not present

## 2020-06-23 NOTE — Progress Notes (Signed)
Radiation Oncology Follow up Note  Name: Shane Stewart   Date:   06/23/2020 MRN:  [id number] DOB: 15-Nov-1945    This 74 y.o. male presents to the clinic today for 3-year follow-up status post both external beam radiation therapy as well as I-125 interstitial implant for stage IIb adenocarcinoma the prostate.  REFERRING PROVIDER: Tonia Ghent, MD  HPI: Patient is a 74 year old male now at 2 years having pleated both external beam radiation as well as I-125 interstitial implant for boost for Gleason 8 (4+4) adenocarcinoma the prostate presenting with a PSA of 6.3.  He is seen today in routine follow-up is doing well.  He specifically denies any increased lower urinary tract symptoms diarrhea or fatigue.  His PSA has jumped slightly.  From 1 year ago when it was less than 0.0 2.13  COMPLICATIONS OF TREATMENT: none  FOLLOW UP COMPLIANCE: keeps appointments   PHYSICAL EXAM:  BP 120/74   Pulse 70   Temp (!) 95.7 F (35.4 C) (Tympanic)   Wt 164 lb (74.4 kg)   BMI 24.94 kg/m  Well-developed well-nourished patient in NAD. HEENT reveals PERLA, EOMI, discs not visualized.  Oral cavity is clear. No oral mucosal lesions are identified. Neck is clear without evidence of cervical or supraclavicular adenopathy. Lungs are clear to A&P. Cardiac examination is essentially unremarkable with regular rate and rhythm without murmur rub or thrill. Abdomen is benign with no organomegaly or masses noted. Motor sensory and DTR levels are equal and symmetric in the upper and lower extremities. Cranial nerves II through XII are grossly intact. Proprioception is intact. No peripheral adenopathy or edema is identified. No motor or sensory levels are noted. Crude visual fields are within normal range.  RADIOLOGY RESULTS: No current films to review  PLAN: Present time patient is at a slight uptake in his PSA.  With a follow-up in 6 months with a repeat PSA should we see a steady incline and PSA will refer him to  medical oncology for evaluation.  I am otherwise pleased with his overall progress and low side effect profile.  Patient knows to call with any concerns.  I would like to take this opportunity to thank you for allowing me to participate in the care of your patient.Noreene Filbert, MD

## 2020-07-28 ENCOUNTER — Ambulatory Visit: Payer: PPO | Admitting: Dermatology

## 2020-07-28 ENCOUNTER — Other Ambulatory Visit: Payer: Self-pay

## 2020-07-28 DIAGNOSIS — L578 Other skin changes due to chronic exposure to nonionizing radiation: Secondary | ICD-10-CM | POA: Diagnosis not present

## 2020-07-28 DIAGNOSIS — L570 Actinic keratosis: Secondary | ICD-10-CM

## 2020-07-28 NOTE — Progress Notes (Signed)
   Follow-Up Visit   Subjective  Shane Stewart is a 74 y.o. male who presents for the following: Actinic Keratosis (Follow up of face - treated with LN2 x 11). He has other areas to be evaluated today.  The following portions of the chart were reviewed this encounter and updated as appropriate:  Tobacco  Allergies  Meds  Problems  Med Hx  Surg Hx  Fam Hx     Review of Systems:  No other skin or systemic complaints except as noted in HPI or Assessment and Plan.  Objective  Well appearing patient in no apparent distress; mood and affect are within normal limits.  A focused examination was performed including the face, ears, and scalp. Relevant physical exam findings are noted in the Assessment and Plan.  Objective  face and ears (5): Erythematous thin papules/macules with gritty scale.   Assessment & Plan  AK (actinic keratosis) (5) face and ears  Destruction of lesion - face and ears Complexity: simple   Destruction method: cryotherapy   Informed consent: discussed and consent obtained   Timeout:  patient name, date of birth, surgical site, and procedure verified Lesion destroyed using liquid nitrogen: Yes   Region frozen until ice ball extended beyond lesion: Yes   Outcome: patient tolerated procedure well with no complications   Post-procedure details: wound care instructions given     Actinic Damage - chronic, secondary to cumulative UV radiation exposure/sun exposure over time - diffuse scaly erythematous macules with underlying dyspigmentation - Recommend daily broad spectrum sunscreen SPF 30+ to sun-exposed areas, reapply every 2 hours as needed.  - Call for new or changing lesions.  Return in about 6 months (around 01/25/2021) for TBSE.  Luther Redo, CMA, am acting as scribe for Sarina Ser, MD .  Documentation: I have reviewed the above documentation for accuracy and completeness, and I agree with the above.  Sarina Ser, MD

## 2020-07-29 ENCOUNTER — Encounter: Payer: Self-pay | Admitting: Dermatology

## 2020-08-24 ENCOUNTER — Encounter: Payer: Self-pay | Admitting: Family Medicine

## 2020-11-13 ENCOUNTER — Encounter: Payer: Self-pay | Admitting: Dermatology

## 2020-11-13 ENCOUNTER — Encounter: Payer: Self-pay | Admitting: Radiation Oncology

## 2020-11-21 ENCOUNTER — Other Ambulatory Visit: Payer: Self-pay | Admitting: *Deleted

## 2020-11-21 DIAGNOSIS — C61 Malignant neoplasm of prostate: Secondary | ICD-10-CM

## 2020-11-22 ENCOUNTER — Other Ambulatory Visit: Payer: PPO

## 2020-11-22 ENCOUNTER — Other Ambulatory Visit: Payer: Self-pay

## 2020-11-22 DIAGNOSIS — C61 Malignant neoplasm of prostate: Secondary | ICD-10-CM

## 2020-11-23 LAB — PSA: Prostate Specific Ag, Serum: <0.1 ng/mL (ref 0.0–4.0)

## 2020-11-25 ENCOUNTER — Encounter: Payer: Self-pay | Admitting: Urology

## 2020-11-25 ENCOUNTER — Ambulatory Visit: Payer: PPO | Admitting: Urology

## 2020-11-25 ENCOUNTER — Other Ambulatory Visit: Payer: Self-pay

## 2020-11-25 VITALS — BP 145/80 | HR 69 | Ht 68.0 in | Wt 166.3 lb

## 2020-11-25 DIAGNOSIS — C61 Malignant neoplasm of prostate: Secondary | ICD-10-CM

## 2020-11-25 NOTE — Addendum Note (Signed)
Addended by: Donalee Citrin on: 11/25/2020 01:33 PM   Modules accepted: Orders

## 2020-11-25 NOTE — Progress Notes (Signed)
11/25/2020 1:20 PM   Shane Stewart 10-27-1945 [id number]  Referring provider: Tonia Ghent, MD [address] [place],  [place] [address]  Chief Complaint  Patient presents with  . Prostate Cancer    Urologic problem list: - T2high risk prostate cancer diagnosed June 2018; PSA 6.3 and 1 cm nodule at right apex. Pathology:(Right apex w/ nodule on DRE) Gleason 4+4 = 8 in4% of the left lateral apex Gleason 4+3 = 7 in40% and 27% of the left lateral base and left medial mid Gleason 3+3 = 6 prostate cancer in7% of the left medial apex.  Underwent brachytherapy/IMRTcompleted 06/2017; along with androgen deprivation   HPI: 75 y.o. male presents for annual follow-up.   No problems since last years visit  Denies bothersome LUTS  Stopped tamsulosin 6-8 months ago  Denies dysuria, gross hematuria  No flank, abdominal or pelvic pain  PSA 11/22/2020 remains undetectable at <0.1   PMH: Past Medical History:  Diagnosis Date  . Allergy    spring, fall  . Cancer [facility])    prostate  . Cataract 12/12/2017   right eye forming   . Diverticulosis of colon 01/1999  . History of CT scan 04/1985   L/S, H/P L5/S1  . Hyperlipidemia    currently ok   . Prostate cancer ([place])   . Right knee pain    better with hyaluronic acid  . Rosacea     Surgical History: Past Surgical History:  Procedure Laterality Date  . APPENDECTOMY    . COLONOSCOPY    . CYSTOSCOPY N/A 07/16/2017   Procedure: CYSTOSCOPY;  Surgeon: Hollice Espy, MD;  Location: [facility] ORS;  Service: Urology;  Laterality: N/A;  . LUMBAR SPINE SURGERY  1986   LS ruptured disc repair  . POLYPECTOMY    . RADIOACTIVE SEED IMPLANT N/A 07/16/2017   Procedure: RADIOACTIVE SEED IMPLANT/BRACHYTHERAPY IMPLANT;  Surgeon: Hollice Espy, MD;  Location: [facility] ORS;  Service: Urology;  Laterality: N/A;  . RADIOACTIVE SEED IMPLANT     Prostate- [facility]  . skin excision  2020   multiple   .  TONSILLECTOMY    . UMBILICAL HERNIA REPAIR  10/05/2005    Home Medications:  Allergies as of 11/25/2020   No Known Allergies     Medication List       Accurate as of November 25, 2020  1:20 PM. If you have any questions, ask your nurse or doctor.        STOP taking these medications   tamsulosin 0.4 MG Caps capsule Commonly known as: FLOMAX Stopped by: Abbie Sons, MD     TAKE these medications   Cholecalciferol 50 MCG (2000 UT) Tabs Take 2,000 Units by mouth daily.   metroNIDAZOLE 0.75 % gel Commonly known as: METROGEL Apply 1 application topically daily.   multivitamin tablet Take 1 tablet by mouth daily.   vitamin B-12 1000 MCG tablet Commonly known as: CYANOCOBALAMIN Take 1,000 mcg by mouth daily.       Allergies: No Known Allergies  Family History: Family History  Problem Relation Age of Onset  . Alzheimer's disease Mother   . Dementia Mother   . Heart disease Father        CAD  . Diabetes Father   . Cancer - Colon Father 53       colon  . Colon cancer Father   . Diabetes Sister   . Heart disease Sister        MI CABG x  3  . Hypertension Sister   . Prostate cancer Neg Hx   . Colon polyps Neg Hx   . Esophageal cancer Neg Hx   . Rectal cancer Neg Hx   . Stomach cancer Neg Hx     Social History:  reports that he has never smoked. He has never used smokeless tobacco. He reports that he does not drink alcohol and does not use drugs.   Physical Exam: BP (!) 145/80 (BP Location: Left Arm, Patient Position: Sitting, Cuff Size: Large)   Pulse 69   Ht 5\' 8"  (1.727 m)   Wt 166 lb 4.8 oz (75.4 kg)   BMI 25.29 kg/m   Constitutional:  Alert and oriented, No acute distress. HEENT: [place] AT, moist mucus membranes.  Trachea midline, no masses. Cardiovascular: No clubbing, cyanosis, or edema. Respiratory: Normal respiratory effort, no increased work of breathing. Skin: No rashes, bruises or suspicious lesions. Neurologic: Grossly intact, no focal deficits,  moving all 4 extremities. Psychiatric: Normal mood and affect.   Assessment & Plan:    1.  T2 high risk prostate cancer s/p EBRT + brachytherapy + ADT  PSA remains undetectable  Continue annual follow-up   Abbie Sons, MD  [facility] [address], [place], [place] [address] [phone]

## 2020-12-14 ENCOUNTER — Other Ambulatory Visit: Payer: PPO

## 2020-12-22 ENCOUNTER — Ambulatory Visit
Admission: RE | Admit: 2020-12-22 | Discharge: 2020-12-22 | Disposition: A | Discharge status: Home / Self Care | Payer: PPO | Source: Ambulatory Visit | Attending: Radiation Oncology | Admitting: Radiation Oncology

## 2020-12-22 ENCOUNTER — Other Ambulatory Visit: Payer: Self-pay

## 2020-12-22 VITALS — BP 124/70 | HR 57 | Temp 95.9°F | Wt 163.8 lb

## 2020-12-22 DIAGNOSIS — C61 Malignant neoplasm of prostate: Secondary | ICD-10-CM | POA: Diagnosis not present

## 2020-12-22 DIAGNOSIS — Z923 Personal history of irradiation: Secondary | ICD-10-CM | POA: Diagnosis not present

## 2020-12-22 DIAGNOSIS — Z08 Encounter for follow-up examination after completed treatment for malignant neoplasm: Secondary | ICD-10-CM | POA: Diagnosis not present

## 2020-12-22 NOTE — Progress Notes (Signed)
Radiation Oncology Follow up Note  Name: Shane Stewart   Date:   12/22/2020 MRN:  [id number] DOB: Mar 13, 1946    This 75 y.o. male presents to the clinic today for 3-1/2-year follow-up status post IMRT radiation therapy as well as I-125 interstitial implant for stage IIb adenocarcinoma the prostate.  REFERRING PROVIDER: Tonia Ghent, MD  HPI: Patient is a 75 year old male now out 3-1/2 years having completed both external beam radiation therapy to prostate and pelvic nodes as well as I-125 interstitial implant for a Gleason 8 (4+4) adenocarcinoma presenting with a PSA of 6.3.  Seen today in routine follow-up he is doing well.  He specifically denies any increased lower urinary tract symptoms diarrhea or fatigue.  His PSA remains less than 0.1.  There is absolutely stable over the past 2 years  COMPLICATIONS OF TREATMENT: none  FOLLOW UP COMPLIANCE: keeps appointments   PHYSICAL EXAM:  BP 124/70   Pulse (!) 57   Temp (!) 95.9 F (35.5 C) (Tympanic)   Wt 163 lb 12.8 oz (74.3 kg)   BMI 24.91 kg/m  Well-developed well-nourished patient in NAD. HEENT reveals PERLA, EOMI, discs not visualized.  Oral cavity is clear. No oral mucosal lesions are identified. Neck is clear without evidence of cervical or supraclavicular adenopathy. Lungs are clear to A&P. Cardiac examination is essentially unremarkable with regular rate and rhythm without murmur rub or thrill. Abdomen is benign with no organomegaly or masses noted. Motor sensory and DTR levels are equal and symmetric in the upper and lower extremities. Cranial nerves II through XII are grossly intact. Proprioception is intact. No peripheral adenopathy or edema is identified. No motor or sensory levels are noted. Crude visual fields are within normal range.  RADIOLOGY RESULTS: No current films for review  PLAN: At the present time patient continues to do well under excellent biochemical control of his prostate cancer and pleased with his overall  progress.  I have asked to see him back in 1 year for follow-up.  Patient knows to call with any concerns.  I would like to take this opportunity to thank you for allowing me to participate in the care of your patient.Noreene Filbert, MD

## 2021-02-02 ENCOUNTER — Ambulatory Visit: Payer: PPO | Admitting: Dermatology

## 2021-02-02 ENCOUNTER — Other Ambulatory Visit: Payer: Self-pay

## 2021-02-02 DIAGNOSIS — Z1283 Encounter for screening for malignant neoplasm of skin: Secondary | ICD-10-CM | POA: Diagnosis not present

## 2021-02-02 DIAGNOSIS — L578 Other skin changes due to chronic exposure to nonionizing radiation: Secondary | ICD-10-CM | POA: Diagnosis not present

## 2021-02-02 DIAGNOSIS — D229 Melanocytic nevi, unspecified: Secondary | ICD-10-CM | POA: Diagnosis not present

## 2021-02-02 DIAGNOSIS — D1800 Hemangioma unspecified site: Secondary | ICD-10-CM | POA: Diagnosis not present

## 2021-02-02 DIAGNOSIS — L821 Other seborrheic keratosis: Secondary | ICD-10-CM | POA: Diagnosis not present

## 2021-02-02 DIAGNOSIS — L570 Actinic keratosis: Secondary | ICD-10-CM | POA: Diagnosis not present

## 2021-02-02 DIAGNOSIS — L814 Other melanin hyperpigmentation: Secondary | ICD-10-CM

## 2021-02-02 NOTE — Patient Instructions (Signed)

## 2021-02-02 NOTE — Progress Notes (Signed)
   Follow-Up Visit   Subjective  Shane Stewart is a 75 y.o. male who presents for the following: Actinic Keratosis (6 month follow up of face and ears treated with LN2 - TBSE today). The patient presents for Total-Body Skin Exam (TBSE) for skin cancer screening and mole check.  The following portions of the chart were reviewed this encounter and updated as appropriate:   Tobacco  Allergies  Meds  Problems  Med Hx  Surg Hx  Fam Hx     Review of Systems:  No other skin or systemic complaints except as noted in HPI or Assessment and Plan.  Objective  Well appearing patient in no apparent distress; mood and affect are within normal limits.  A full examination was performed including scalp, head, eyes, ears, nose, lips, neck, chest, axillae, abdomen, back, buttocks, bilateral upper extremities, bilateral lower extremities, hands, feet, fingers, toes, fingernails, and toenails. All findings within normal limits unless otherwise noted below.  Objective  Scalp, face, ears (8): Erythematous thin papules/macules with gritty scale.    Assessment & Plan    Lentigines - Scattered tan macules - Due to sun exposure - Benign-appering, observe - Recommend daily broad spectrum sunscreen SPF 30+ to sun-exposed areas, reapply every 2 hours as needed. - Call for any changes  Seborrheic Keratoses - Stuck-on, waxy, tan-brown papules and/or plaques  - Benign-appearing - Discussed benign etiology and prognosis. - Observe - Call for any changes  Melanocytic Nevi - Tan-brown and/or pink-flesh-colored symmetric macules and papules - Benign appearing on exam today - Observation - Call clinic for new or changing moles - Recommend daily use of broad spectrum spf 30+ sunscreen to sun-exposed areas.   Hemangiomas - Red papules - Discussed benign nature - Observe - Call for any changes  Actinic Damage - Chronic condition, secondary to cumulative UV/sun exposure - diffuse scaly erythematous  macules with underlying dyspigmentation - Recommend daily broad spectrum sunscreen SPF 30+ to sun-exposed areas, reapply every 2 hours as needed.  - Staying in the shade or wearing long sleeves, sun glasses (UVA+UVB protection) and wide brim hats (4-inch brim around the entire circumference of the hat) are also recommended for sun protection.  - Call for new or changing lesions.  Skin cancer screening performed today.  AK (actinic keratosis) (8) Scalp, face, ears  Destruction of lesion - Scalp, face, ears Complexity: simple   Destruction method: cryotherapy   Informed consent: discussed and consent obtained   Timeout:  patient name, date of birth, surgical site, and procedure verified Lesion destroyed using liquid nitrogen: Yes   Region frozen until ice ball extended beyond lesion: Yes   Outcome: patient tolerated procedure well with no complications   Post-procedure details: wound care instructions given    Skin cancer screening  Return in about 1 year (around 02/02/2022) for TBSE.  I, Ashok Cordia, CMA, am acting as scribe for Sarina Ser, MD .  Documentation: I have reviewed the above documentation for accuracy and completeness, and I agree with the above.  Sarina Ser, MD

## 2021-02-07 ENCOUNTER — Encounter: Payer: Self-pay | Admitting: Dermatology

## 2021-03-05 ENCOUNTER — Other Ambulatory Visit: Payer: Self-pay | Admitting: Family Medicine

## 2021-03-05 DIAGNOSIS — Z8639 Personal history of other endocrine, nutritional and metabolic disease: Secondary | ICD-10-CM

## 2021-03-05 DIAGNOSIS — E7800 Pure hypercholesterolemia, unspecified: Secondary | ICD-10-CM

## 2021-03-05 DIAGNOSIS — R7309 Other abnormal glucose: Secondary | ICD-10-CM

## 2021-03-06 ENCOUNTER — Other Ambulatory Visit (INDEPENDENT_AMBULATORY_CARE_PROVIDER_SITE_OTHER): Payer: PPO

## 2021-03-06 ENCOUNTER — Other Ambulatory Visit: Payer: Self-pay

## 2021-03-06 DIAGNOSIS — E7800 Pure hypercholesterolemia, unspecified: Secondary | ICD-10-CM

## 2021-03-06 DIAGNOSIS — Z8639 Personal history of other endocrine, nutritional and metabolic disease: Secondary | ICD-10-CM | POA: Diagnosis not present

## 2021-03-06 DIAGNOSIS — R7309 Other abnormal glucose: Secondary | ICD-10-CM | POA: Diagnosis not present

## 2021-03-06 LAB — LIPID PANEL
Cholesterol: 167 mg/dL (ref 0–200)
HDL: 62.6 mg/dL (ref >39.00)
LDL Cholesterol: 92 mg/dL (ref 0–99)
NonHDL: 104.08
Total CHOL/HDL Ratio: 3
Triglycerides: 62 mg/dL (ref 0.0–149.0)
VLDL: 12.4 mg/dL (ref 0.0–40.0)

## 2021-03-06 LAB — COMPREHENSIVE METABOLIC PANEL
ALT: 13 U/L (ref 0–53)
AST: 17 U/L (ref 0–37)
Albumin: 4.2 g/dL (ref 3.5–5.2)
Alkaline Phosphatase: 72 U/L (ref 39–117)
BUN: 17 mg/dL (ref 6–23)
CO2: 28 mEq/L (ref 19–32)
Calcium: 9.1 mg/dL (ref 8.4–10.5)
Chloride: 105 mEq/L (ref 96–112)
Creatinine, Ser: 0.91 mg/dL (ref 0.40–1.50)
GFR: 83.35 mL/min (ref >60.00)
Glucose, Bld: 99 mg/dL (ref 70–99)
Potassium: 4.3 mEq/L (ref 3.5–5.1)
Sodium: 140 mEq/L (ref 135–145)
Total Bilirubin: 1 mg/dL (ref 0.2–1.2)
Total Protein: 6.2 g/dL (ref 6.0–8.3)

## 2021-03-06 LAB — VITAMIN D 25 HYDROXY (VIT D DEFICIENCY, FRACTURES): VITD: 37.81 ng/mL (ref 30.00–100.00)

## 2021-03-06 LAB — HEMOGLOBIN A1C: Hgb A1c MFr Bld: 5.8 % (ref 4.6–6.5)

## 2021-03-13 ENCOUNTER — Ambulatory Visit (INDEPENDENT_AMBULATORY_CARE_PROVIDER_SITE_OTHER): Payer: PPO | Admitting: Family Medicine

## 2021-03-13 ENCOUNTER — Other Ambulatory Visit: Payer: Self-pay

## 2021-03-13 ENCOUNTER — Encounter: Payer: Self-pay | Admitting: Family Medicine

## 2021-03-13 VITALS — BP 118/62 | HR 73 | Temp 97.9°F | Ht 68.0 in | Wt 165.0 lb

## 2021-03-13 DIAGNOSIS — C61 Malignant neoplasm of prostate: Secondary | ICD-10-CM

## 2021-03-13 DIAGNOSIS — Z0000 Encounter for general adult medical examination without abnormal findings: Secondary | ICD-10-CM

## 2021-03-13 DIAGNOSIS — Z7189 Other specified counseling: Secondary | ICD-10-CM

## 2021-03-13 NOTE — Progress Notes (Signed)
This visit occurred during the SARS-CoV-2 public health emergency.  Safety protocols were in place, including screening questions prior to the visit, additional usage of staff PPE, and extensive cleaning of exam room while observing appropriate contact time as indicated for disinfecting solutions.  I have personally reviewed the Medicare Annual Wellness questionnaire and have noted 1. The patient's medical and social history 2. Their use of alcohol, tobacco or illicit drugs 3. Their current medications and supplements 4. The patient's functional ability including ADL's, fall risks, home safety risks and hearing or visual             impairment. 5. Diet and physical activities 6. Evidence for depression or mood disorders  The patients weight, height, BMI have been recorded in the chart and visual acuity is per eye clinic.  I have made referrals, counseling and provided education to the patient based review of the above and I have provided the pt with a written personalized care plan for preventive services.  Provider list updated- see scanned forms.  Routine anticipatory guidance given to patient.  See health maintenance. The possibility exists that previously documented standard health maintenance information may have been brought forward from a previous encounter into this note.  If needed, that same information has been updated to reflect the current situation based on today's encounter.    Recent labs discussed with patient.  Unremarkable.  Flu up-to-date Shingles up-to-date PNA up-to-date Tetanus 2021 COVID-vaccine up-to-date Colonoscopy 2020 PSA prev per urology, low PSA.  Nocturia 0-1 per night.   Advance directive-Brent Parnell designated patient were incapacitated. Cognitive function addressed- see scanned forms- and if abnormal then additional documentation follows.   Occ R knee soreness, better with taking hyaluronic acid.  Still active.  He can update me as needed.  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  I thanked him for his effort.  PMH and SH reviewed  Meds, vitals, and allergies reviewed.   ROS: Per HPI.  Unless specifically indicated otherwise in HPI, the patient denies:  General: fever. Eyes: acute vision changes ENT: sore throat Cardiovascular: chest pain Respiratory: SOB GI: vomiting GU: dysuria Musculoskeletal: acute back pain Derm: acute rash Neuro: acute motor dysfunction Psych: worsening mood Endocrine: polydipsia Heme: bleeding Allergy: hayfever  GEN: nad, alert and oriented HEENT: ncat NECK: supple w/o LA CV: rrr. PULM: ctab, no inc wob ABD: soft, +bs EXT: no edema SKIN: no acute rash

## 2021-03-13 NOTE — Patient Instructions (Signed)
You can try plain claritin 10mg  a day if needed for seasonal allergies.  Then add on flonase if needed.   Take care.  Glad to see you.

## 2021-03-15 NOTE — Assessment & Plan Note (Signed)
Advance directive-Shane Stewart designated patient were incapacitated.

## 2021-03-15 NOTE — Assessment & Plan Note (Signed)
Flu up-to-date Shingles up-to-date PNA up-to-date Tetanus 2021 COVID-vaccine up-to-date Colonoscopy 2020 PSA prev per urology, low PSA.  Nocturia 0-1 per night.   Advance directive-Shane Stewart designated patient were incapacitated. Cognitive function addressed- see scanned forms- and if abnormal then additional documentation follows.   Occ R knee soreness, better with taking hyaluronic acid.  Still active.

## 2021-03-15 NOTE — Assessment & Plan Note (Signed)
Per urology.  I will defer.  He agrees. 

## 2021-03-16 ENCOUNTER — Encounter: Payer: Self-pay | Admitting: Family Medicine

## 2021-03-16 ENCOUNTER — Other Ambulatory Visit: Payer: Self-pay

## 2021-03-16 ENCOUNTER — Telehealth (INDEPENDENT_AMBULATORY_CARE_PROVIDER_SITE_OTHER): Payer: PPO | Admitting: Family Medicine

## 2021-03-16 DIAGNOSIS — J069 Acute upper respiratory infection, unspecified: Secondary | ICD-10-CM | POA: Diagnosis not present

## 2021-03-16 DIAGNOSIS — Z20822 Contact with and (suspected) exposure to covid-19: Secondary | ICD-10-CM | POA: Diagnosis not present

## 2021-03-16 NOTE — Progress Notes (Signed)
Interactive audio and video telecommunications were attempted between this provider and patient, however failed, due to patient having technical difficulties OR patient did not have access to video capability.  We continued and completed visit with audio only.   Virtual Visit via Telephone Note  I connected with patient on 03/16/21  at 4:45 PM  by telephone and verified that I am speaking with the correct person using two identifiers.  Location of patient: home  Location of MD: [place] Name of referring provider (if blank then none associated): Names per persons and role in encounter:  MD: Earlyne Iba, Patient: name listed above.    I discussed the limitations, risks, security and privacy concerns of performing an evaluation and management service by telephone and the availability of in person appointments. I also discussed with the patient that there may be a patient responsible charge related to this service. The patient expressed understanding and agreed to proceed.  CC: URI   History of Present Illness:  sx started 2 days ago.  ST.  Hoarse voice.  Temps this AM 99.6.  normal now per patient report.  Some cough.  More cough a few days ago.  Some sputum, yellowish.  Not SOB.  No vomiting, no diarrhea.  Normal taste and smell.  Some pain near the B eyes, better than this AM.  No vision changes.  No frontal or maxillary pain.  No ear pain.  No rash.  Some stuffy nose. Post nasal gtt.  Frequent throat clearing.  Doing well eating and drinking, taking plenty of PO fluids.  Taking OTC cough medicine.  Tussin cough medicine used w/o much relief.  He took covid test, neg so far with one pending.  No wheeze.  Cough is better today.  More head congestion today.     Observations/Objective:  Hoarse voice.  NAD  Assessment and Plan:  URI sx.   D/w pt about flonase trial in the meantime.  He started claritin in the meantime.   We can check with patient tomorrow unless he calls back first.   Discussed possible paxlovid use (GFR not low) but hold rxing until results back.    Interval update.  I checked in with patient over the weekend.  His COVID test was negative.  He went to urgent care and was started on antibiotics and prednisone. Follow Up Instructions: see above.    I discussed the assessment and treatment plan with the patient. The patient was provided an opportunity to ask questions and all were answered. The patient agreed with the plan and demonstrated an understanding of the instructions.   The patient was advised to call back or seek an in-person evaluation if the symptoms worsen or if the condition fails to improve as anticipated.  I provided 22 minutes of non-face-to-face time during this encounter.  Elsie Stain, MD

## 2021-03-16 NOTE — Telephone Encounter (Signed)
Spoke to Dr Damita Dunnings. He said to add pt on at 430 for a virtual. Spoke to pt and made appt.

## 2021-03-18 DIAGNOSIS — R519 Headache, unspecified: Secondary | ICD-10-CM | POA: Diagnosis not present

## 2021-03-18 DIAGNOSIS — R509 Fever, unspecified: Secondary | ICD-10-CM | POA: Diagnosis not present

## 2021-03-18 DIAGNOSIS — J029 Acute pharyngitis, unspecified: Secondary | ICD-10-CM | POA: Diagnosis not present

## 2021-03-18 DIAGNOSIS — R059 Cough, unspecified: Secondary | ICD-10-CM | POA: Diagnosis not present

## 2021-03-19 ENCOUNTER — Encounter: Payer: Self-pay | Admitting: Family Medicine

## 2021-03-19 NOTE — Assessment & Plan Note (Signed)
URI sx.   D/w pt about flonase trial in the meantime.  He started claritin in the meantime.   We can check with patient tomorrow unless he calls back first.  Discussed possible paxlovid use (GFR not low) but hold rxing until results back.    Interval update.  I checked in with patient over the weekend.  His COVID test was negative.  He went to urgent care and was started on antibiotics and prednisone.

## 2021-04-05 DIAGNOSIS — H2513 Age-related nuclear cataract, bilateral: Secondary | ICD-10-CM | POA: Diagnosis not present

## 2021-05-17 ENCOUNTER — Ambulatory Visit: Payer: PPO | Admitting: Dermatology

## 2021-05-17 ENCOUNTER — Other Ambulatory Visit: Payer: Self-pay

## 2021-05-17 ENCOUNTER — Encounter: Payer: Self-pay | Admitting: Dermatology

## 2021-05-17 DIAGNOSIS — L578 Other skin changes due to chronic exposure to nonionizing radiation: Secondary | ICD-10-CM | POA: Diagnosis not present

## 2021-05-17 DIAGNOSIS — D692 Other nonthrombocytopenic purpura: Secondary | ICD-10-CM

## 2021-05-17 DIAGNOSIS — L570 Actinic keratosis: Secondary | ICD-10-CM | POA: Diagnosis not present

## 2021-05-17 NOTE — Progress Notes (Signed)
   Follow-Up Visit   Subjective  Shane Stewart is a 75 y.o. male who presents for the following: check new spot (L temple/ lat eye, 3 days, no symptoms).  The following portions of the chart were reviewed this encounter and updated as appropriate:   Tobacco  Allergies  Meds  Problems  Med Hx  Surg Hx  Fam Hx     Review of Systems:  No other skin or systemic complaints except as noted in HPI or Assessment and Plan.  Objective  Well appearing patient in no apparent distress; mood and affect are within normal limits.  A focused examination was performed including face, arms, scalp. Relevant physical exam findings are noted in the Assessment and Plan.  L face x 2 (2) Pink scaly macules   L lat canthus/zygoma purpura   Assessment & Plan  AK (actinic keratosis) (2) L face x 2  Destruction of lesion - L face x 2 Complexity: simple   Destruction method: cryotherapy   Informed consent: discussed and consent obtained   Timeout:  patient name, date of birth, surgical site, and procedure verified Lesion destroyed using liquid nitrogen: Yes   Region frozen until ice ball extended beyond lesion: Yes   Outcome: patient tolerated procedure well with no complications   Post-procedure details: wound care instructions given    Purpura ([place]) L lat canthus/zygoma Benign, observe.    Actinic Damage - chronic, secondary to cumulative UV radiation exposure/sun exposure over time - diffuse scaly erythematous macules with underlying dyspigmentation - Recommend daily broad spectrum sunscreen SPF 30+ to sun-exposed areas, reapply every 2 hours as needed.  - Recommend staying in the shade or wearing long sleeves, sun glasses (UVA+UVB protection) and wide brim hats (4-inch brim around the entire circumference of the hat). - Call for new or changing lesions.  Return for as scheduled.  I, Othelia Pulling, RMA, am acting as scribe for Sarina Ser, MD . Documentation: I have reviewed the  above documentation for accuracy and completeness, and I agree with the above.  Sarina Ser, MD

## 2021-05-17 NOTE — Patient Instructions (Signed)

## 2021-09-16 ENCOUNTER — Encounter: Payer: Self-pay | Admitting: Family Medicine

## 2021-09-20 ENCOUNTER — Other Ambulatory Visit: Payer: Self-pay | Admitting: Family Medicine

## 2021-09-20 ENCOUNTER — Encounter: Payer: Self-pay | Admitting: Family Medicine

## 2021-09-20 MED ORDER — SILDENAFIL CITRATE 20 MG PO TABS: 60.0000 mg | ORAL_TABLET | Freq: Every day | ORAL | 12 refills | Status: DC | PRN

## 2021-11-13 ENCOUNTER — Other Ambulatory Visit: Payer: PPO

## 2021-11-20 ENCOUNTER — Encounter: Payer: PPO | Admitting: Family Medicine

## 2021-11-24 ENCOUNTER — Other Ambulatory Visit: Payer: Self-pay

## 2021-11-24 ENCOUNTER — Other Ambulatory Visit: Payer: PPO

## 2021-11-24 DIAGNOSIS — C61 Malignant neoplasm of prostate: Secondary | ICD-10-CM | POA: Diagnosis not present

## 2021-11-25 LAB — PSA: Prostate Specific Ag, Serum: <0.1 ng/mL (ref 0.0–4.0)

## 2021-11-27 ENCOUNTER — Other Ambulatory Visit: Payer: Self-pay

## 2021-11-27 ENCOUNTER — Encounter: Payer: Self-pay | Admitting: Urology

## 2021-11-27 ENCOUNTER — Ambulatory Visit: Payer: PPO | Admitting: Urology

## 2021-11-27 VITALS — BP 142/73 | HR 73 | Ht 68.0 in | Wt 168.0 lb

## 2021-11-27 DIAGNOSIS — C61 Malignant neoplasm of prostate: Secondary | ICD-10-CM

## 2021-11-27 NOTE — Progress Notes (Signed)
? ?11/27/2021 ?1:01 PM  ? ?Shane Stewart ?1946-08-25 ?[id number] ? ?Referring provider: Tonia Ghent, MD ?[facility] ?[place],  [place] [address] ? ?Chief Complaint  ?Patient presents with  ? Elevated PSA  ? ? ?Urologic problem list: ?- T2 high risk prostate cancer diagnosed June 2018; PSA 6.3 and 1 cm nodule at right apex. ?Pathology: (Right apex w/ nodule on DRE) ?Gleason 4+4 = 8 in 4% of the left lateral apex ?Gleason 4+3 = 7 in 40% and 27% of the left lateral base and left medial mid ?Gleason 3+3 = 6 prostate cancer in 7% of the left medial apex.   ?Underwent brachytherapy/IMRT completed 06/2017; along with androgen deprivation  ? ? ?HPI: ?76 y.o. male presents for annual follow-up. ? ?No problems since last years visit ?Denies bothersome LUTS ?Stopped tamsulosin 6-8 months ago ?Denies dysuria, gross hematuria ?No flank, abdominal or pelvic pain ?PSA 11/24/2021 remains undetectable at <0.1 ? ? ?PMH: ?Past Medical History:  ?Diagnosis Date  ? Allergy   ? spring, fall  ? Cancer [facility])   ? prostate  ? Cataract 12/12/2017  ? right eye forming   ? Diverticulosis of colon 01/1999  ? History of CT scan 04/1985  ? L/S, H/P L5/S1  ? Hyperlipidemia   ? currently ok   ? Prostate cancer ([place])   ? Right knee pain   ? better with hyaluronic acid  ? Rosacea   ? ? ?Surgical History: ?Past Surgical History:  ?Procedure Laterality Date  ? APPENDECTOMY    ? COLONOSCOPY    ? CYSTOSCOPY N/A 07/16/2017  ? Procedure: CYSTOSCOPY;  Surgeon: Hollice Espy, MD;  Location: [facility] ORS;  Service: Urology;  Laterality: N/A;  ? [place]  ? LS ruptured disc repair  ? POLYPECTOMY    ? RADIOACTIVE SEED IMPLANT N/A 07/16/2017  ? Procedure: RADIOACTIVE SEED IMPLANT/BRACHYTHERAPY IMPLANT;  Surgeon: Hollice Espy, MD;  Location: [facility] ORS;  Service: Urology;  Laterality: N/A;  ? RADIOACTIVE SEED IMPLANT    ? Prostate- [facility]  ? skin excision  2020  ? multiple   ? TONSILLECTOMY    ? UMBILICAL HERNIA REPAIR   10/05/2005  ? ? ?Home Medications:  ?Allergies as of 11/27/2021   ?No Known Allergies ?  ? ?  ?Medication List  ?  ? ?  ? Accurate as of November 27, 2021  1:01 PM. If you have any questions, ask your nurse or doctor.  ?  ?  ? ?  ? ?Cholecalciferol 50 MCG (2000 UT) Tabs ?Take 2,000 Units by mouth daily. ?  ?metroNIDAZOLE 0.75 % gel ?Commonly known as: METROGEL ?Apply 1 application topically daily. ?  ?multivitamin tablet ?Take 1 tablet by mouth daily. ?  ?sildenafil 20 MG tablet ?Commonly known as: REVATIO ?Take 3-5 tablets (60-100 mg total) by mouth daily as needed. ?  ?vitamin B-12 1000 MCG tablet ?Commonly known as: CYANOCOBALAMIN ?Take 1,000 mcg by mouth daily. ?  ? ?  ? ? ?Allergies: No Known Allergies ? ?Family History: ?Family History  ?Problem Relation Age of Onset  ? Alzheimer's disease Mother   ? Dementia Mother   ? Heart disease Father   ?     CAD  ? Diabetes Father   ? Cancer - Colon Father 30  ?     colon  ? Colon cancer Father   ? Diabetes Sister   ? Heart disease Sister   ?     MI CABG x 3  ?  Hypertension Sister   ? Prostate cancer Neg Hx   ? Esophageal cancer Neg Hx   ? Rectal cancer Neg Hx   ? Stomach cancer Neg Hx   ? ? ?Social History:  reports that he has never smoked. He has never used smokeless tobacco. He reports that he does not drink alcohol and does not use drugs. ? ? ?Physical Exam: ?BP (!) 142/73   Pulse 73   Ht '5\' 8"'$  (1.727 m)   Wt 168 lb (76.2 kg)   BMI 25.54 kg/m?   ?Constitutional:  Alert and oriented, No acute distress. ?Neurologic: Grossly intact, no focal deficits, moving all 4 extremities. ?Psychiatric: Normal mood and affect. ? ? ?Assessment & Plan:   ? ?1.  T2 high risk prostate cancer s/p EBRT + brachytherapy + ADT ?PSA remains undetectable ?Continue annual follow-up with PSA ? ? ?Abbie Sons, MD ? ?[facility] ?[address] ?[place], [place] [address] ?[phone] ? ?

## 2021-12-14 ENCOUNTER — Encounter: Payer: Self-pay | Admitting: Dermatology

## 2021-12-19 DIAGNOSIS — M5418 Radiculopathy, sacral and sacrococcygeal region: Secondary | ICD-10-CM | POA: Diagnosis not present

## 2021-12-19 DIAGNOSIS — M9904 Segmental and somatic dysfunction of sacral region: Secondary | ICD-10-CM | POA: Diagnosis not present

## 2021-12-19 DIAGNOSIS — M9903 Segmental and somatic dysfunction of lumbar region: Secondary | ICD-10-CM | POA: Diagnosis not present

## 2021-12-19 DIAGNOSIS — M7918 Myalgia, other site: Secondary | ICD-10-CM | POA: Diagnosis not present

## 2021-12-20 DIAGNOSIS — M5418 Radiculopathy, sacral and sacrococcygeal region: Secondary | ICD-10-CM | POA: Diagnosis not present

## 2021-12-20 DIAGNOSIS — M9903 Segmental and somatic dysfunction of lumbar region: Secondary | ICD-10-CM | POA: Diagnosis not present

## 2021-12-20 DIAGNOSIS — M9904 Segmental and somatic dysfunction of sacral region: Secondary | ICD-10-CM | POA: Diagnosis not present

## 2021-12-20 DIAGNOSIS — M7918 Myalgia, other site: Secondary | ICD-10-CM | POA: Diagnosis not present

## 2021-12-22 DIAGNOSIS — M9904 Segmental and somatic dysfunction of sacral region: Secondary | ICD-10-CM | POA: Diagnosis not present

## 2021-12-22 DIAGNOSIS — M5418 Radiculopathy, sacral and sacrococcygeal region: Secondary | ICD-10-CM | POA: Diagnosis not present

## 2021-12-22 DIAGNOSIS — M9903 Segmental and somatic dysfunction of lumbar region: Secondary | ICD-10-CM | POA: Diagnosis not present

## 2021-12-22 DIAGNOSIS — M7918 Myalgia, other site: Secondary | ICD-10-CM | POA: Diagnosis not present

## 2021-12-25 ENCOUNTER — Ambulatory Visit
Admission: RE | Admit: 2021-12-25 | Discharge: 2021-12-25 | Disposition: A | Discharge status: Home / Self Care | Payer: PPO | Source: Ambulatory Visit | Attending: Radiation Oncology | Admitting: Radiation Oncology

## 2021-12-25 VITALS — BP 133/69 | HR 62 | Temp 95.6°F | Ht 68.0 in | Wt 174.9 lb

## 2021-12-25 DIAGNOSIS — Z923 Personal history of irradiation: Secondary | ICD-10-CM | POA: Insufficient documentation

## 2021-12-25 DIAGNOSIS — C61 Malignant neoplasm of prostate: Secondary | ICD-10-CM | POA: Insufficient documentation

## 2021-12-25 DIAGNOSIS — Z08 Encounter for follow-up examination after completed treatment for malignant neoplasm: Secondary | ICD-10-CM | POA: Diagnosis not present

## 2021-12-25 DIAGNOSIS — M5418 Radiculopathy, sacral and sacrococcygeal region: Secondary | ICD-10-CM | POA: Diagnosis not present

## 2021-12-25 DIAGNOSIS — M9903 Segmental and somatic dysfunction of lumbar region: Secondary | ICD-10-CM | POA: Diagnosis not present

## 2021-12-25 DIAGNOSIS — M7918 Myalgia, other site: Secondary | ICD-10-CM | POA: Diagnosis not present

## 2021-12-25 DIAGNOSIS — M9904 Segmental and somatic dysfunction of sacral region: Secondary | ICD-10-CM | POA: Diagnosis not present

## 2021-12-25 NOTE — Progress Notes (Signed)
Radiation Oncology ?Follow up Note ? ?Name: Shane Stewart   ?Date:   12/25/2021 ?MRN:  [id number] ?DOB: Sep 22, 1946  ? ? ?This 76 y.o. male presents to the clinic today for 4 and half year follow-up status post IMRT radiation therapy as well as I-125 interstitial implant for stage IIb adenocarcinoma the prostate. ? ?REFERRING PROVIDER: Tonia Ghent, MD ? ?HPI: Patient is a 76 year old male now out close to 5 years having completed both external beam radiation therapy to his prostate and pelvic nodes as well as I-125 interstitial implant for boost for Gleason 8 (4+4) adenocarcinoma presenting with a PSA of 6.3.  Seen today in routine follow-up is doing well specifically denies any increased lower urinary tract symptoms diarrhea or fatigue.  His PSA remains less than 0.1.Marland Kitchen ? ?COMPLICATIONS OF TREATMENT: none ? ?FOLLOW UP COMPLIANCE: keeps appointments  ? ?PHYSICAL EXAM:  ?BP 133/69   Pulse 62   Temp (!) 95.6 ?F (35.3 ?C)   Ht '5\' 8"'$  (1.727 m)   Wt 174 lb 14.4 oz (79.3 kg)   BMI 26.59 kg/m?  ?Well-developed well-nourished patient in NAD. HEENT reveals PERLA, EOMI, discs not visualized.  Oral cavity is clear. No oral mucosal lesions are identified. Neck is clear without evidence of cervical or supraclavicular adenopathy. Lungs are clear to A&P. Cardiac examination is essentially unremarkable with regular rate and rhythm without murmur rub or thrill. Abdomen is benign with no organomegaly or masses noted. Motor sensory and DTR levels are equal and symmetric in the upper and lower extremities. Cranial nerves II through XII are grossly intact. Proprioception is intact. No peripheral adenopathy or edema is identified. No motor or sensory levels are noted. Crude visual fields are within normal range. ? ?RADIOLOGY RESULTS: No current films for review ? ?PLAN: Present time patient is doing well under excellent biochemical control of his prostate cancer now at close to 5 years.  I am turning follow-up care over to urology  and his GP.  I would be happy to reevaluate him anytime should further intervention be indicated. ? ?I would like to take this opportunity to thank you for allowing me to participate in the care of your patient.. ?  ? Noreene Filbert, MD ? ?

## 2021-12-27 DIAGNOSIS — M9903 Segmental and somatic dysfunction of lumbar region: Secondary | ICD-10-CM | POA: Diagnosis not present

## 2021-12-27 DIAGNOSIS — M5418 Radiculopathy, sacral and sacrococcygeal region: Secondary | ICD-10-CM | POA: Diagnosis not present

## 2021-12-27 DIAGNOSIS — M9904 Segmental and somatic dysfunction of sacral region: Secondary | ICD-10-CM | POA: Diagnosis not present

## 2021-12-27 DIAGNOSIS — M7918 Myalgia, other site: Secondary | ICD-10-CM | POA: Diagnosis not present

## 2022-01-01 DIAGNOSIS — M5418 Radiculopathy, sacral and sacrococcygeal region: Secondary | ICD-10-CM | POA: Diagnosis not present

## 2022-01-01 DIAGNOSIS — M7918 Myalgia, other site: Secondary | ICD-10-CM | POA: Diagnosis not present

## 2022-01-01 DIAGNOSIS — M9903 Segmental and somatic dysfunction of lumbar region: Secondary | ICD-10-CM | POA: Diagnosis not present

## 2022-01-01 DIAGNOSIS — M9904 Segmental and somatic dysfunction of sacral region: Secondary | ICD-10-CM | POA: Diagnosis not present

## 2022-01-03 DIAGNOSIS — M7918 Myalgia, other site: Secondary | ICD-10-CM | POA: Diagnosis not present

## 2022-01-03 DIAGNOSIS — M5418 Radiculopathy, sacral and sacrococcygeal region: Secondary | ICD-10-CM | POA: Diagnosis not present

## 2022-01-03 DIAGNOSIS — M9904 Segmental and somatic dysfunction of sacral region: Secondary | ICD-10-CM | POA: Diagnosis not present

## 2022-01-03 DIAGNOSIS — M9903 Segmental and somatic dysfunction of lumbar region: Secondary | ICD-10-CM | POA: Diagnosis not present

## 2022-02-08 ENCOUNTER — Ambulatory Visit: Payer: PPO | Admitting: Dermatology

## 2022-02-08 DIAGNOSIS — D229 Melanocytic nevi, unspecified: Secondary | ICD-10-CM

## 2022-02-08 DIAGNOSIS — L570 Actinic keratosis: Secondary | ICD-10-CM

## 2022-02-08 DIAGNOSIS — Z1283 Encounter for screening for malignant neoplasm of skin: Secondary | ICD-10-CM

## 2022-02-08 DIAGNOSIS — L578 Other skin changes due to chronic exposure to nonionizing radiation: Secondary | ICD-10-CM | POA: Diagnosis not present

## 2022-02-08 DIAGNOSIS — L814 Other melanin hyperpigmentation: Secondary | ICD-10-CM

## 2022-02-08 DIAGNOSIS — D1800 Hemangioma unspecified site: Secondary | ICD-10-CM

## 2022-02-08 MED ORDER — FLUOROURACIL 5 % EX CREA: TOPICAL_CREAM | Freq: Two times a day (BID) | CUTANEOUS | 3 refills | Status: DC

## 2022-02-08 NOTE — Progress Notes (Signed)
Follow-Up Visit   Subjective  Shane Stewart is a 76 y.o. male who presents for the following: Total body skin exam (Hx of AKs). The patient presents for Total-Body Skin Exam (TBSE) for skin cancer screening and mole check.  The patient has spots, moles and lesions to be evaluated, some may be new or changing and the patient has concerns that these could be cancer.  The following portions of the chart were reviewed this encounter and updated as appropriate:   Tobacco  Allergies  Meds  Problems  Med Hx  Surg Hx  Fam Hx     Review of Systems:  No other skin or systemic complaints except as noted in HPI or Assessment and Plan.  Objective  Well appearing patient in no apparent distress; mood and affect are within normal limits.  A full examination was performed including scalp, head, eyes, ears, nose, lips, neck, chest, axillae, abdomen, back, buttocks, bilateral upper extremities, bilateral lower extremities, hands, feet, fingers, toes, fingernails, and toenails. All findings within normal limits unless otherwise noted below.  face, scalp, ears x 6 (6) Pink scaly macules   Assessment & Plan   Lentigines - Scattered tan macules - Due to sun exposure - Benign-appearing, observe - Recommend daily broad spectrum sunscreen SPF 30+ to sun-exposed areas, reapply every 2 hours as needed. - Call for any changes  Seborrheic Keratoses - Stuck-on, waxy, tan-brown papules and/or plaques  - Benign-appearing - Discussed benign etiology and prognosis. - Observe - Call for any changes  Melanocytic Nevi - Tan-brown and/or pink-flesh-colored symmetric macules and papules - Benign appearing on exam today - Observation - Call clinic for new or changing moles - Recommend daily use of broad spectrum spf 30+ sunscreen to sun-exposed areas.   Hemangiomas - Red papules - Discussed benign nature - Observe - Call for any changes  Actinic Damage - Severe, confluent actinic changes with  pre-cancerous actinic keratoses  - Severe, chronic, not at goal, secondary to cumulative UV radiation exposure over time - diffuse scaly erythematous macules and papules with underlying dyspigmentation - Discussed Prescription "Field Treatment" for Severe, Chronic Confluent Actinic Changes with Pre-Cancerous Actinic Keratoses Field treatment involves treatment of an entire area of skin that has confluent Actinic Changes (Sun/ Ultraviolet light damage) and PreCancerous Actinic Keratoses by method of PhotoDynamic Therapy (PDT) and/or prescription Topical Chemotherapy agents such as 5-fluorouracil, 5-fluorouracil/calcipotriene, and/or imiquimod.  The purpose is to decrease the number of clinically evident and subclinical PreCancerous lesions to prevent progression to development of skin cancer by chemically destroying early precancer changes that may or may not be visible.  It has been shown to reduce the risk of developing skin cancer in the treated area. As a result of treatment, redness, scaling, crusting, and open sores may occur during treatment course. One or more than one of these methods may be used and may have to be used several times to control, suppress and eliminate the PreCancerous changes. Discussed treatment course, expected reaction, and possible side effects. - Recommend daily broad spectrum sunscreen SPF 30+ to sun-exposed areas, reapply every 2 hours as needed.  - Staying in the shade or wearing long sleeves, sun glasses (UVA+UVB protection) and wide brim hats (4-inch brim around the entire circumference of the hat) are also recommended. - Call for new or changing lesions.  -Start 5-fluorouracil/calcipotriene cream twice a day for 7 days to affected areas including forehead, temples, cheek/nose, scalp/ears. Prescription sent to [facility]. Patient provided with contact information for pharmacy  and advised the pharmacy will mail the prescription to their home. Patient provided with  handout reviewing treatment course and side effects and advised to call or message [place] on MyChart with any concerns.   Skin cancer screening performed today.  AK (actinic keratosis) (6) face, scalp, ears x 6  Destruction of lesion - face, scalp, ears x 6 Complexity: simple   Destruction method: cryotherapy   Informed consent: discussed and consent obtained   Timeout:  patient name, date of birth, surgical site, and procedure verified Lesion destroyed using liquid nitrogen: Yes   Region frozen until ice ball extended beyond lesion: Yes   Outcome: patient tolerated procedure well with no complications   Post-procedure details: wound care instructions given    fluorouracil (EFUDEX) 5 % cream - face, scalp, ears x 6 Apply topically 2 (two) times daily. Bid for 7 days to forehead, temples, cheeks, nose, scalp and ears as directed.  Skin cancer screening   Return in about 6 months (around 08/11/2022) for AK f/u.  I, Othelia Pulling, RMA, am acting as scribe for Sarina Ser, MD . Documentation: I have reviewed the above documentation for accuracy and completeness, and I agree with the above.  Sarina Ser, MD

## 2022-02-08 NOTE — Patient Instructions (Addendum)
-Start 5-fluorouracil/calcipotriene cream twice a day for 7 days to affected areas including forehead, temples, cheek/nose, scalp/ears. Prescription sent to [facility].   Will treat the forehead first for 7 days, then wait 2 weeks and then treat the temples for 7 days, then wait 2 weeks and treat the cheeks/nose for 7 days, then wait 2 weeks and treat the scalp/ears for 7 days  5-Fluorouracil/Calcipotriene Patient Education   Actinic keratoses are the dry, red scaly spots on the skin caused by sun damage. A portion of these spots can turn into skin cancer with time, and treating them can help prevent development of skin cancer.   Treatment of these spots requires removal of the defective skin cells. There are various ways to remove actinic keratoses, including freezing with liquid nitrogen, treatment with creams, or treatment with a blue light procedure in the office.   5-fluorouracil cream is a topical cream used to treat actinic keratoses. It works by interfering with the growth of abnormal fast-growing skin cells, such as actinic keratoses. These cells peel off and are replaced by healthy ones.   5-fluorouracil/calcipotriene is a combination of the 5-fluorouracil cream with a vitamin D analog cream called calcipotriene. The calcipotriene alone does not treat actinic keratoses. However, when it is combined with 5-fluorouracil, it helps the 5-fluorouracil treat the actinic keratoses much faster so that the same results can be achieved with a much shorter treatment time.  INSTRUCTIONS FOR 5-FLUOROURACIL/CALCIPOTRIENE CREAM:   5-fluorouracil/calcipotriene cream typically only needs to be used for 4-7 days. A thin layer should be applied twice a day to the treatment areas recommended by your physician.   If your physician prescribed you separate tubes of 5-fluourouracil and calcipotriene, apply a thin layer of 5-fluorouracil followed by a thin layer of calcipotriene.   Avoid contact with your  eyes, nostrils, and mouth. Do not use 5-fluorouracil/calcipotriene cream on infected or open wounds.   You will develop redness, irritation and some crusting at areas where you have pre-cancer damage/actinic keratoses. IF YOU DEVELOP PAIN, BLEEDING, OR SIGNIFICANT CRUSTING, STOP THE TREATMENT EARLY - you have already gotten a good response and the actinic keratoses should clear up well.  Wash your hands after applying 5-fluorouracil 5% cream on your skin.   A moisturizer or sunscreen with a minimum SPF 30 should be applied each morning.   Once you have finished the treatment, you can apply a thin layer of Vaseline twice a day to irritated areas to soothe and calm the areas more quickly. If you experience significant discomfort, contact your physician.  For some patients it is necessary to repeat the treatment for best results.  SIDE EFFECTS: When using 5-fluorouracil/calcipotriene cream, you may have mild irritation, such as redness, dryness, swelling, or a mild burning sensation. This usually resolves within 2 weeks. The more actinic keratoses you have, the more redness and inflammation you can expect during treatment. Eye irritation has been reported rarely. If this occurs, please let us know.   If you have any trouble using this cream, please call the office. If you have any other questions about this information, please do not hesitate to ask me before you leave the office.  If You Need Anything After Your Visit  If you have any questions or concerns for your doctor, please call our main line at [phone] and press option 4 to reach your doctor's medical assistant. If no one answers, please leave a voicemail as directed and we will return your call as soon as possible.  Messages left after 4 pm will be answered the following business day.   You may also send [place] a message via [place]. We typically respond to MyChart messages within 1-2 business days.  For prescription refills, please ask  your pharmacy to contact our office. Our fax number is [phone].  If you have an urgent issue when the clinic is closed that cannot wait until the next business day, you can page your doctor at the number below.    Please note that while we do our best to be available for urgent issues outside of office hours, we are not available 24/7.   If you have an urgent issue and are unable to reach [place], you may choose to seek medical care at your doctor's office, retail clinic, urgent care center, or emergency room.  If you have a medical emergency, please immediately call 911 or go to the emergency department.  Pager Numbers  - Dr. Nehemiah Massed: [phone]  - Dr. Laurence Ferrari: [phone]  - Dr. Nicole Kindred: [phone]  In the event of inclement weather, please call our main line at [phone] for an update on the status of any delays or closures.  Dermatology Medication Tips: Please keep the boxes that topical medications come in in order to help keep track of the instructions about where and how to use these. Pharmacies typically print the medication instructions only on the boxes and not directly on the medication tubes.   If your medication is too expensive, please contact our office at [phone] option 4 or send [place] a message through [place].   We are unable to tell what your co-pay for medications will be in advance as this is different depending on your insurance coverage. However, we may be able to find a substitute medication at lower cost or fill out paperwork to get insurance to cover a needed medication.   If a prior authorization is required to get your medication covered by your insurance company, please allow [place] 1-2 business days to complete this process.  Drug prices often vary depending on where the prescription is filled and some pharmacies may offer cheaper prices.  The website www.goodrx.com contains coupons for medications through different pharmacies. The prices here do not  account for what the cost may be with help from insurance (it may be cheaper with your insurance), but the website can give you the price if you did not use any insurance.  - You can print the associated coupon and take it with your prescription to the pharmacy.  - You may also stop by our office during regular business hours and pick up a GoodRx coupon card.  - If you need your prescription sent electronically to a different pharmacy, notify our office through [facility] or by phone at [phone] option 4.     Si Usted Necesita Algo Despus de Su Visita  Tambin puede enviarnos un mensaje a travs de Pharmacist, community. Por lo general respondemos a los mensajes de MyChart en el transcurso de 1 a 2 das hbiles.  Para renovar recetas, por favor pida a su farmacia que se ponga en contacto con nuestra oficina. Harland Dingwall de fax es [place] [phone].  Si tiene un asunto urgente cuando la clnica est cerrada y que no puede esperar hasta el siguiente da hbil, puede llamar/localizar a su doctor(a) al nmero que aparece a continuacin.   Por favor, tenga en cuenta que aunque hacemos todo lo posible para estar disponibles para asuntos urgentes fuera del horario de [place], no  estamos disponibles las 24 horas del da, los 7 das de la [place].   Si tiene un problema urgente y no puede comunicarse con nosotros, puede optar por buscar atencin mdica  en el consultorio de su doctor(a), en una clnica privada, en un centro de atencin urgente o en una sala de emergencias.  Si tiene Engineering geologist, por favor llame inmediatamente al 911 o vaya a la sala de emergencias.  Nmeros de bper  - Dr. Nehemiah Massed: [phone]  - Dra. Moye: [phone]  - Dra. Nicole Kindred: [phone]  En caso de inclemencias del [place], por favor llame a Johnsie Kindred principal al [phone] para una actualizacin sobre el [place] de cualquier retraso o cierre.  Consejos para la medicacin en dermatologa: Por  favor, guarde las cajas en las que vienen los medicamentos de uso tpico para ayudarle a seguir las instrucciones sobre dnde y cmo usarlos. Las farmacias generalmente imprimen las instrucciones del medicamento slo en las cajas y no directamente en los tubos del [place].   Si su medicamento es muy caro, por favor, pngase en contacto con Zigmund Daniel llamando al [phone] y presione la opcin 4 o envenos un mensaje a travs de Pharmacist, community.   No podemos decirle cul ser su copago por los medicamentos por adelantado ya que esto es diferente dependiendo de la cobertura de su seguro. Sin embargo, es posible que podamos encontrar un medicamento sustituto a Electrical engineer un formulario para que el seguro cubra el medicamento que se considera necesario.   Si se requiere una autorizacin previa para que su compaa de seguros [place] su medicamento, por favor permtanos de 1 a 2 das hbiles para completar este proceso.  Los precios de los medicamentos varan con frecuencia dependiendo del Environmental consultant de dnde se surte la receta y alguna farmacias pueden ofrecer precios ms baratos.  El sitio web www.goodrx.com tiene cupones para medicamentos de Airline pilot. Los precios aqu no tienen en cuenta lo que podra costar con la ayuda del seguro (puede ser ms barato con su seguro), pero el sitio web puede darle el precio si no utiliz Research scientist (physical sciences).  - Puede imprimir el cupn correspondiente y llevarlo con su receta a la farmacia.  - Tambin puede pasar por nuestra oficina durante el horario de atencin regular y Charity fundraiser una tarjeta de cupones de GoodRx.  - Si necesita que su receta se enve electrnicamente a una farmacia diferente, informe a nuestra oficina a travs de MyChart de [place] o por telfono llamando al [phone] y presione la opcin 4.

## 2022-02-18 ENCOUNTER — Encounter: Payer: Self-pay | Admitting: Dermatology

## 2022-02-25 ENCOUNTER — Other Ambulatory Visit: Payer: Self-pay | Admitting: Family Medicine

## 2022-02-25 DIAGNOSIS — E7800 Pure hypercholesterolemia, unspecified: Secondary | ICD-10-CM

## 2022-02-25 DIAGNOSIS — Z8639 Personal history of other endocrine, nutritional and metabolic disease: Secondary | ICD-10-CM

## 2022-02-25 DIAGNOSIS — R7309 Other abnormal glucose: Secondary | ICD-10-CM

## 2022-03-05 ENCOUNTER — Other Ambulatory Visit: Payer: PPO

## 2022-03-07 ENCOUNTER — Other Ambulatory Visit (INDEPENDENT_AMBULATORY_CARE_PROVIDER_SITE_OTHER): Payer: PPO

## 2022-03-07 DIAGNOSIS — R7309 Other abnormal glucose: Secondary | ICD-10-CM

## 2022-03-07 DIAGNOSIS — Z8639 Personal history of other endocrine, nutritional and metabolic disease: Secondary | ICD-10-CM | POA: Diagnosis not present

## 2022-03-07 DIAGNOSIS — E7800 Pure hypercholesterolemia, unspecified: Secondary | ICD-10-CM | POA: Diagnosis not present

## 2022-03-07 LAB — COMPREHENSIVE METABOLIC PANEL
ALT: 15 U/L (ref 0–53)
AST: 15 U/L (ref 0–37)
Albumin: 4.2 g/dL (ref 3.5–5.2)
Alkaline Phosphatase: 86 U/L (ref 39–117)
BUN: 14 mg/dL (ref 6–23)
CO2: 30 mEq/L (ref 19–32)
Calcium: 9.1 mg/dL (ref 8.4–10.5)
Chloride: 104 mEq/L (ref 96–112)
Creatinine, Ser: 0.89 mg/dL (ref 0.40–1.50)
GFR: 84.16 mL/min (ref >60.00)
Glucose, Bld: 100 mg/dL — ABNORMAL HIGH (ref 70–99)
Potassium: 4.4 mEq/L (ref 3.5–5.1)
Sodium: 138 mEq/L (ref 135–145)
Total Bilirubin: 0.8 mg/dL (ref 0.2–1.2)
Total Protein: 6.2 g/dL (ref 6.0–8.3)

## 2022-03-07 LAB — LIPID PANEL
Cholesterol: 175 mg/dL (ref 0–200)
HDL: 61.6 mg/dL (ref >39.00)
LDL Cholesterol: 101 mg/dL — ABNORMAL HIGH (ref 0–99)
NonHDL: 113.75
Total CHOL/HDL Ratio: 3
Triglycerides: 64 mg/dL (ref 0.0–149.0)
VLDL: 12.8 mg/dL (ref 0.0–40.0)

## 2022-03-07 LAB — HEMOGLOBIN A1C: Hgb A1c MFr Bld: 5.7 % (ref 4.6–6.5)

## 2022-03-07 LAB — VITAMIN D 25 HYDROXY (VIT D DEFICIENCY, FRACTURES): VITD: 30.28 ng/mL (ref 30.00–100.00)

## 2022-03-12 ENCOUNTER — Encounter: Payer: PPO | Admitting: Family Medicine

## 2022-03-13 ENCOUNTER — Encounter: Payer: Self-pay | Admitting: Family Medicine

## 2022-03-13 ENCOUNTER — Ambulatory Visit (INDEPENDENT_AMBULATORY_CARE_PROVIDER_SITE_OTHER): Payer: PPO | Admitting: Family Medicine

## 2022-03-13 VITALS — BP 120/74 | HR 66 | Temp 97.7°F | Ht 68.0 in | Wt 174.1 lb

## 2022-03-13 DIAGNOSIS — C61 Malignant neoplasm of prostate: Secondary | ICD-10-CM

## 2022-03-13 DIAGNOSIS — Z0000 Encounter for general adult medical examination without abnormal findings: Secondary | ICD-10-CM | POA: Diagnosis not present

## 2022-03-13 DIAGNOSIS — Z7189 Other specified counseling: Secondary | ICD-10-CM

## 2022-03-13 DIAGNOSIS — L719 Rosacea, unspecified: Secondary | ICD-10-CM

## 2022-03-13 MED ORDER — METRONIDAZOLE 0.75 % EX GEL: 1.0000 | Freq: Every day | CUTANEOUS | 12 refills | Status: DC

## 2022-03-13 MED ORDER — SILDENAFIL CITRATE 20 MG PO TABS: 60.0000 mg | ORAL_TABLET | Freq: Every day | ORAL | 12 refills | Status: DC | PRN

## 2022-03-13 NOTE — Patient Instructions (Signed)
Update me as needed.  Thanks for your effort.  Take care.  Glad to see you. 

## 2022-03-13 NOTE — Assessment & Plan Note (Signed)
Wife Shane Stewart designated if patient were incapacitated.

## 2022-03-13 NOTE — Progress Notes (Unsigned)
I have personally reviewed the Medicare Annual Wellness questionnaire and have noted 1. The patient's medical and social history 2. Their use of alcohol, tobacco or illicit drugs 3. Their current medications and supplements 4. The patient's functional ability including ADL's, fall risks, home safety risks and hearing or visual             impairment. 5. Diet and physical activities 6. Evidence for depression or mood disorders  The patients weight, height, BMI have been recorded in the chart and visual acuity is per eye clinic.  I have made referrals, counseling and provided education to the patient based review of the above and I have provided the pt with a written personalized care plan for preventive services.  Provider list updated- see scanned forms.  Routine anticipatory guidance given to patient.  See health maintenance. The possibility exists that previously documented standard health maintenance information may have been brought forward from a previous encounter into this note.  If needed, that same information has been updated to reflect the current situation based on today's encounter.    Flu Shingles PNA Tetanus Colon  Breast cancer screening Prostate cancer screening Advance directive- Wife Bush[place] designated if patient were incapacitated.   Cognitive function addressed- see scanned forms- and if abnormal then additional documentation follows.   In addition to [facility], follow up visit for the below conditions:  Prostate cancer per uro.  No ADE on sildenafil, no NTG use.  D/w pt.    PMH and SH reviewed  Meds, vitals, and allergies reviewed.   ROS: Per HPI.  Unless specifically indicated otherwise in HPI, the patient denies:  General: fever. Eyes: acute vision changes ENT: sore throat Cardiovascular: chest pain Respiratory: SOB GI: vomiting GU: dysuria Musculoskeletal: acute back pain Derm: acute rash Neuro: acute motor dysfunction Psych: worsening  mood Endocrine: polydipsia Heme: bleeding Allergy: hayfever  GEN: nad, alert and oriented HEENT: mucous membranes moist NECK: supple w/o LA CV: rrr. PULM: ctab, no inc wob ABD: soft, +bs EXT: no edema SKIN: no acute rash

## 2022-03-14 NOTE — Assessment & Plan Note (Signed)
Continue metronidazole.

## 2022-03-14 NOTE — Assessment & Plan Note (Signed)
Flu previously done Shingles previously done PNA previously done Tetanus previously done COVID-vaccine previously done Colonoscopy 2020 PSA per urology. Advance directive- Wife Mischelle designated if patient were incapacitated.   Cognitive function addressed- see scanned forms- and if abnormal then additional documentation follows.

## 2022-03-14 NOTE — Assessment & Plan Note (Signed)
History of, per uro.  No ADE on sildenafil, no NTG use.  D/w pt.

## 2022-05-16 DIAGNOSIS — H2513 Age-related nuclear cataract, bilateral: Secondary | ICD-10-CM | POA: Diagnosis not present

## 2022-06-10 ENCOUNTER — Encounter: Payer: Self-pay | Admitting: Family Medicine

## 2022-06-11 ENCOUNTER — Telehealth (INDEPENDENT_AMBULATORY_CARE_PROVIDER_SITE_OTHER): Payer: PPO | Admitting: Family

## 2022-06-11 ENCOUNTER — Encounter: Payer: Self-pay | Admitting: Family

## 2022-06-11 VITALS — BP 119/75 | Ht 68.0 in | Wt 169.0 lb

## 2022-06-11 DIAGNOSIS — U071 COVID-19: Secondary | ICD-10-CM | POA: Insufficient documentation

## 2022-06-11 DIAGNOSIS — Z8546 Personal history of malignant neoplasm of prostate: Secondary | ICD-10-CM

## 2022-06-11 MED ORDER — MOLNUPIRAVIR EUA 200MG CAPSULE: 4.0000 | ORAL_CAPSULE | Freq: Two times a day (BID) | ORAL | 0 refills | Status: AC

## 2022-06-11 NOTE — Telephone Encounter (Signed)
Spoke to patient by telephone and scheduled an appointment today 06/11/22 at 12:20 with Eugenia Pancoast NP. Patient stated that he tested positive yesterday. Patient was given ER precautions and he verbalized understanding.

## 2022-06-11 NOTE — Progress Notes (Signed)
MyChart Video Visit    Virtual Visit via Video Note   This visit type was conducted due to national recommendations for restrictions regarding the COVID-19 Pandemic (e.g. social distancing) in an effort to limit this patient's exposure and mitigate transmission in our community. This patient is at least at moderate risk for complications without adequate follow up. This format is felt to be most appropriate for this patient at this time. Physical exam was limited by quality of the video and audio technology used for the visit. CMA was able to get the patient set up on a video visit.  Patient location: Home. Patient and provider in visit Provider location: Office  I discussed the limitations of evaluation and management by telemedicine and the availability of in person appointments. The patient expressed understanding and agreed to proceed.  Visit Date: 06/11/2022  Today's healthcare provider: Eugenia Pancoast, FNP     Subjective:    Patient ID: Shane Stewart, male    DOB: July 23, 1946, 76 y.o.   MRN: [id number]  Chief Complaint  Patient presents with   Covid Positive    Since yesterday     HPI  Pt here today via video visit with concerns.  Tested positive for covid 9/17.  Started with symptoms Saturday on the 16th.  He has some cough that is productive and some nasal congestion. Sinus pressure.   No fever or chills.  Slight chest congestion. No sob or wheezing.   His wife at home also tested positive for covid.   Past Medical History:  Diagnosis Date   Actinic keratoses    Allergy    spring, fall   Cancer [facility])    prostate   Cataract 12/12/2017   right eye forming    Diverticulosis of colon 01/1999   History of CT scan 04/1985   L/S, H/P L5/S1   Hyperlipidemia    currently ok    Prostate cancer ([place])    Right knee pain    better with hyaluronic acid   Rosacea     Past Surgical History:  Procedure Laterality Date   APPENDECTOMY     COLONOSCOPY      CYSTOSCOPY N/A 07/16/2017   Procedure: CYSTOSCOPY;  Surgeon: Hollice Espy, MD;  Location: [facility] ORS;  Service: Urology;  Laterality: N/A;   LUMBAR SPINE SURGERY  1986   LS ruptured disc repair   POLYPECTOMY     RADIOACTIVE SEED IMPLANT N/A 07/16/2017   Procedure: RADIOACTIVE SEED IMPLANT/BRACHYTHERAPY IMPLANT;  Surgeon: Hollice Espy, MD;  Location: [facility] ORS;  Service: Urology;  Laterality: N/A;   RADIOACTIVE SEED IMPLANT     Prostate- [place]   skin excision  2020   multiple    TONSILLECTOMY     UMBILICAL HERNIA REPAIR  10/05/2005    Family History  Problem Relation Age of Onset   Alzheimer's disease Mother    Dementia Mother    Heart disease Father        CAD   Diabetes Father    Cancer - Colon Father 66       colon   Colon cancer Father    Diabetes Sister    Heart disease Sister        MI CABG x 3   Hypertension Sister    Prostate cancer Neg Hx    Esophageal cancer Neg Hx    Rectal cancer Neg Hx    Stomach cancer Neg Hx     Social History   Socioeconomic History  Marital status: Married    Spouse name: Not on file   Number of children: 0   Years of education: Not on file   Highest education level: Not on file  Occupational History   Occupation: MGR//Contractor Self Employed    Employer: retired  Tobacco Use   Smoking status: Never   Smokeless tobacco: Never  Vaping Use   Vaping Use: Never used  Substance and Sexual Activity   Alcohol use: No   Drug use: No   Sexual activity: Yes  Other Topics Concern   Not on file  Social History Narrative   Remarried 2022.  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    Social Determinants of Health   Financial Resource Strain: Not on file  Food Insecurity:  Not on file  Transportation Needs: Not on file  Physical Activity: Not on file  Stress: Not on file  Social Connections: Not on file  Intimate Partner Violence: Not on file    Outpatient Medications Prior to Visit  Medication Sig Dispense Refill   Cholecalciferol 2000 units TABS Take 2,000 Units by mouth daily.     fluorouracil (EFUDEX) 5 % cream Apply topically 2 (two) times daily. Bid for 7 days to forehead, temples, cheeks, nose, scalp and ears as directed. 15 g 3   folic acid (FOLVITE) 433 MCG tablet Take 400 mcg by mouth daily.     metroNIDAZOLE (METROGEL) 0.75 % gel Apply 1 Application topically daily. 45 g 12   sildenafil (REVATIO) 20 MG tablet Take 3-5 tablets (60-100 mg total) by mouth daily as needed. 50 tablet 12   vitamin B-12 (CYANOCOBALAMIN) 1000 MCG tablet Take 1,000 mcg by mouth daily.     vitamin C (ASCORBIC ACID) 500 MG tablet Take 500 mg by mouth daily.     zinc gluconate 50 MG tablet Take 50 mg by mouth daily.     No facility-administered medications prior to visit.    No Known Allergies  Review of Systems     Objective:    Physical Exam Constitutional:      General: He is not in acute distress.    Appearance: Normal appearance. He is normal weight. He is not ill-appearing, toxic-appearing or diaphoretic.  Pulmonary:     Effort: Pulmonary effort is normal.  Neurological:     Mental Status: He is alert.  Psychiatric:        Mood and Affect: Mood normal.        Behavior: Behavior normal.        Thought Content: Thought content normal.        Judgment: Judgment normal.     BP 119/75   Ht '5\' 8"'$  (1.727 m)   Wt 169 lb (76.7 kg)   BMI 25.70 kg/m  Wt Readings from Last 3 Encounters:  06/11/22 169 lb (76.7 kg)  03/13/22 174 lb 2 oz (79 kg)  12/25/21 174 lb 14.4 oz (79.3 kg)       Assessment & Plan:   Problem List Items Addressed This Visit       Other   History of prostate cancer - Primary   COVID-19    Advised of CDC guidelines for self  isolation/ ending isolation.  Advised of  safe practice guidelines. Symptom Tier reviewed.  Encouraged to monitor for any worsening symptoms; watch for increased shortness of breath, weakness, and signs of dehydration. Advised when to seek emergency care.  Instructed to rest and hydrate well.  Advised to leave the house during recommended isolation period, only if it is necessary to seek medical care  Pt considered high risk for hospitalization. have decided pt is a candidate for antiviral and pt agrees that she would like to take this. I have sent in RX for molnupiravir 200 mg capsules to be taken as directed.  Did recommend otc mucinex       Relevant Medications   molnupiravir EUA (LAGEVRIO) 200 mg CAPS capsule    I am having Roque Cash. Mascaro start on molnupiravir EUA. I am also having him maintain his cyanocobalamin, Cholecalciferol, fluorouracil, ascorbic acid, zinc gluconate, folic acid, sildenafil, and metroNIDAZOLE.  Meds ordered this encounter  Medications   molnupiravir EUA (LAGEVRIO) 200 mg CAPS capsule    Sig: Take 4 capsules (800 mg total) by mouth 2 (two) times daily for 5 days.    Dispense:  40 capsule    Refill:  0    Order Specific Question:   Supervising Provider    Answer:   BEDSOLE, AMY E [[id number]]    I discussed the assessment and treatment plan with the patient. The patient was provided an opportunity to ask questions and all were answered. The patient agreed with the plan and demonstrated an understanding of the instructions.   The patient was advised to call back or seek an in-person evaluation if the symptoms worsen or if the condition fails to improve as anticipated.  I provided 15 minutes of face-to-face time during this encounter.   Eugenia Pancoast, [place] [facility] [place] [phone] (phone) [phone] (fax)  [place]

## 2022-06-11 NOTE — Patient Instructions (Signed)
------------------------------------ 

## 2022-06-11 NOTE — Telephone Encounter (Signed)
Agree with precautions given to pt  Agree with nurse assessment in plan.  Thank you for speaking with them. 

## 2022-06-11 NOTE — Assessment & Plan Note (Addendum)
Advised of CDC guidelines for self isolation/ ending isolation.  Advised of safe practice guidelines. Symptom Tier reviewed.  Encouraged to monitor for any worsening symptoms; watch for increased shortness of breath, weakness, and signs of dehydration. Advised when to seek emergency care.  Instructed to rest and hydrate well.  Advised to leave the house during recommended isolation period, only if it is necessary to seek medical care  Pt considered high risk for hospitalization. have decided pt is a candidate for antiviral and pt agrees that she would like to take this. I have sent in RX for molnupiravir 200 mg capsules to be taken as directed.  Did recommend otc mucinex

## 2022-06-22 ENCOUNTER — Encounter: Payer: Self-pay | Admitting: Urology

## 2022-07-26 ENCOUNTER — Telehealth: Payer: Self-pay

## 2022-07-26 NOTE — Patient Outreach (Signed)
  Care Coordination   07/26/2022 Name: Shane Stewart MRN: [id number] DOB: September 11, 1946   Care Coordination Outreach Attempts:  An unsuccessful telephone outreach was attempted today to offer the patient information about available care coordination services as a benefit of their health plan.   Follow Up Plan:  Additional outreach attempts will be made to offer the patient care coordination information and services.   Encounter Outcome:  No Answer  Care Coordination Interventions Activated:  No   Care Coordination Interventions:  No, not indicated    Quinn Plowman RN,BSN,CCM [place] [phone] direct line

## 2022-08-02 ENCOUNTER — Encounter: Payer: Self-pay | Admitting: Family Medicine

## 2022-08-13 ENCOUNTER — Ambulatory Visit: Payer: PPO | Admitting: Dermatology

## 2022-08-31 ENCOUNTER — Ambulatory Visit (INDEPENDENT_AMBULATORY_CARE_PROVIDER_SITE_OTHER): Payer: PPO | Admitting: Family Medicine

## 2022-08-31 ENCOUNTER — Encounter: Payer: Self-pay | Admitting: Family Medicine

## 2022-08-31 VITALS — BP 118/60 | HR 70 | Temp 97.5°F | Ht 68.0 in | Wt 179.0 lb

## 2022-08-31 DIAGNOSIS — K219 Gastro-esophageal reflux disease without esophagitis: Secondary | ICD-10-CM

## 2022-08-31 MED ORDER — OMEPRAZOLE 20 MG PO CPDR: 20.0000 mg | DELAYED_RELEASE_CAPSULE | Freq: Every day | ORAL | 3 refills | Status: DC

## 2022-08-31 NOTE — Progress Notes (Unsigned)
GERD. More sx after eating chocolate, he thought that contributed.  Bothered at night, had to sleep sitting up.  Sx going on intermittently over the last year.   Taking TUMS/rolaids.  Frequent belching.  Swallowing well now.  Prev dilation noted.  No vomiting, no blood in stools. No black stools. No recent PPI use.    Meds, vitals, and allergies reviewed.   ROS: Per HPI unless specifically indicated in ROS section   Prev EGD 2019 - Normal larynx. - LA Grade B reflux esophagitis. Biopsied. Dilated. - Small hiatal hernia. - Normal stomach. - Normal examined duodenum.  Path neg for Barrett's at the time.

## 2022-08-31 NOTE — Patient Instructions (Signed)
Go to the lab on the way out.   If you have mychart we'll likely use that to update you.    Start prilosec after collecting a sample. If test is positive, we'll address.     Assuming your test is negative, take prilosec for 1 month.  Then slowly taper by 1 pill a week if doing well.  Take care.  Glad to see you.

## 2022-09-02 DIAGNOSIS — K219 Gastro-esophageal reflux disease without esophagitis: Secondary | ICD-10-CM | POA: Insufficient documentation

## 2022-09-02 NOTE — Assessment & Plan Note (Signed)
Check H. pylori test. Start prilosec after collecting a sample. If test is positive, we'll address.     Assuming H. pylori test is negative, take prilosec for 1 month.  Then slowly taper by 1 pill a week if doing well.  Limit trigger food exposure.  He will update me as needed.

## 2022-09-03 ENCOUNTER — Other Ambulatory Visit: Payer: Self-pay

## 2022-09-03 DIAGNOSIS — K219 Gastro-esophageal reflux disease without esophagitis: Secondary | ICD-10-CM | POA: Diagnosis not present

## 2022-09-04 LAB — HELICOBACTER PYLORI  SPECIAL ANTIGEN
MICRO NUMBER:: 14297251
SPECIMEN QUALITY: ADEQUATE

## 2022-09-27 ENCOUNTER — Ambulatory Visit: Payer: PPO | Admitting: Dermatology

## 2022-09-27 VITALS — BP 104/60 | HR 75

## 2022-09-27 DIAGNOSIS — L578 Other skin changes due to chronic exposure to nonionizing radiation: Secondary | ICD-10-CM | POA: Diagnosis not present

## 2022-09-27 DIAGNOSIS — L570 Actinic keratosis: Secondary | ICD-10-CM | POA: Diagnosis not present

## 2022-09-27 NOTE — Progress Notes (Signed)
   Follow-Up Visit   Subjective  Shane Stewart is a 77 y.o. male who presents for the following: Actinic Keratosis (Face, scalp, ears, 73mf/u, Pt used 5FU/Calcipotriene cr to forehead, temples, cheeks, nose, scalp and ears bid for 7 days since last visit, pt states he had a good reaction). The patient has spots, moles and lesions to be evaluated, some may be new or changing and the patient has concerns that these could be cancer.  The following portions of the chart were reviewed this encounter and updated as appropriate:   Tobacco  Allergies  Meds  Problems  Med Hx  Surg Hx  Fam Hx     Review of Systems:  No other skin or systemic complaints except as noted in HPI or Assessment and Plan.  Objective  Well appearing patient in no apparent distress; mood and affect are within normal limits.  A focused examination was performed including face, scalp, ears. Relevant physical exam findings are noted in the Assessment and Plan.  forehead x 3, scalp x 1 (4) Pink scaly macules   Assessment & Plan   Actinic Damage - chronic, secondary to cumulative UV radiation exposure/sun exposure over time - diffuse scaly erythematous macules with underlying dyspigmentation - Recommend daily broad spectrum sunscreen SPF 30+ to sun-exposed areas, reapply every 2 hours as needed.  - Recommend staying in the shade or wearing long sleeves, sun glasses (UVA+UVB protection) and wide brim hats (4-inch brim around the entire circumference of the hat). - Call for new or changing lesions.   AK (actinic keratosis) (4) forehead x 3, scalp x 1  Good results with 5FU/Calcipotriene treatment  Destruction of lesion - forehead x 3, scalp x 1 Complexity: simple   Destruction method: cryotherapy   Informed consent: discussed and consent obtained   Timeout:  patient name, date of birth, surgical site, and procedure verified Lesion destroyed using liquid nitrogen: Yes   Region frozen until ice ball extended  beyond lesion: Yes   Outcome: patient tolerated procedure well with no complications   Post-procedure details: wound care instructions given    Actinic skin damage   Return in about 1 year (around 09/28/2023) for TBSE, Hx of AKs.  I, SOthelia Pulling RMA, am acting as scribe for DSarina Ser MD . Documentation: I have reviewed the above documentation for accuracy and completeness, and I agree with the above.  DSarina Ser MD

## 2022-09-27 NOTE — Patient Instructions (Addendum)
Cryotherapy Aftercare  Wash gently with soap and water everyday.   Apply Vaseline and Band-Aid daily until healed.     Due to recent changes in healthcare laws, you may see results of your pathology and/or laboratory studies on MyChart before the doctors have had a chance to review them. We understand that in some cases there may be results that are confusing or concerning to you. Please understand that not all results are received at the same time and often the doctors may need to interpret multiple results in order to provide you with the best plan of care or course of treatment. Therefore, we ask that you please give us 2 business days to thoroughly review all your results before contacting the office for clarification. Should we see a critical lab result, you will be contacted sooner.   If You Need Anything After Your Visit  If you have any questions or concerns for your doctor, please call our main line at [phone] and press option 4 to reach your doctor's medical assistant. If no one answers, please leave a voicemail as directed and we will return your call as soon as possible. Messages left after 4 pm will be answered the following business day.   You may also send us a message via MyChart. We typically respond to MyChart messages within 1-2 business days.  For prescription refills, please ask your pharmacy to contact our office. Our fax number is [phone].  If you have an urgent issue when the clinic is closed that cannot wait until the next business day, you can page your doctor at the number below.    Please note that while we do our best to be available for urgent issues outside of office hours, we are not available 24/7.   If you have an urgent issue and are unable to reach us, you may choose to seek medical care at your doctor's office, retail clinic, urgent care center, or emergency room.  If you have a medical emergency, please immediately call 911 or go to the  emergency department.  Pager Numbers  - Dr. Kowalski: [phone]  - Dr. Moye: [phone]  - Dr. Stewart: [phone]  In the event of inclement weather, please call our main line at [phone] for an update on the status of any delays or closures.  Dermatology Medication Tips: Please keep the boxes that topical medications come in in order to help keep track of the instructions about where and how to use these. Pharmacies typically print the medication instructions only on the boxes and not directly on the medication tubes.   If your medication is too expensive, please contact our office at [phone] option 4 or send us a message through MyChart.   We are unable to tell what your co-pay for medications will be in advance as this is different depending on your insurance coverage. However, we may be able to find a substitute medication at lower cost or fill out paperwork to get insurance to cover a needed medication.   If a prior authorization is required to get your medication covered by your insurance company, please allow us 1-2 business days to complete this process.  Drug prices often vary depending on where the prescription is filled and some pharmacies may offer cheaper prices.  The website www.goodrx.com contains coupons for medications through different pharmacies. The prices here do not account for what the cost may be with help from insurance (it may be cheaper with your insurance), but the website can   give you the price if you did not use any insurance.  - You can print the associated coupon and take it with your prescription to the pharmacy.  - You may also stop by our office during regular business hours and pick up a GoodRx coupon card.  - If you need your prescription sent electronically to a different pharmacy, notify our office through [place] MyChart or by phone at [phone] option 4.     Si Usted Necesita Algo Despus de Su Visita  Tambin puede  enviarnos un mensaje a travs de MyChart. Por lo general respondemos a los mensajes de MyChart en el transcurso de 1 a 2 das hbiles.  Para renovar recetas, por favor pida a su farmacia que se ponga en contacto con nuestra oficina. Nuestro nmero de fax es el [phone].  Si tiene un asunto urgente cuando la clnica est cerrada y que no puede esperar hasta el siguiente da hbil, puede llamar/localizar a su doctor(a) al nmero que aparece a continuacin.   Por favor, tenga en cuenta que aunque hacemos todo lo posible para estar disponibles para asuntos urgentes fuera del horario de oficina, no estamos disponibles las 24 horas del da, los 7 das de la semana.   Si tiene un problema urgente y no puede comunicarse con nosotros, puede optar por buscar atencin mdica  en el consultorio de su doctor(a), en una clnica privada, en un centro de atencin urgente o en una sala de emergencias.  Si tiene una emergencia mdica, por favor llame inmediatamente al 911 o vaya a la sala de emergencias.  Nmeros de bper  - Dr. Kowalski: [phone]  - Dra. Moye: [phone]  - Dra. Stewart: [phone]  En caso de inclemencias del tiempo, por favor llame a nuestra lnea principal al [phone] para una actualizacin sobre el estado de cualquier retraso o cierre.  Consejos para la medicacin en dermatologa: Por favor, guarde las cajas en las que vienen los medicamentos de uso tpico para ayudarle a seguir las instrucciones sobre dnde y cmo usarlos. Las farmacias generalmente imprimen las instrucciones del medicamento slo en las cajas y no directamente en los tubos del medicamento.   Si su medicamento es muy caro, por favor, pngase en contacto con nuestra oficina llamando al [phone] y presione la opcin 4 o envenos un mensaje a travs de MyChart.   No podemos decirle cul ser su copago por los medicamentos por adelantado ya que esto es diferente dependiendo de la cobertura de su seguro.  Sin embargo, es posible que podamos encontrar un medicamento sustituto a menor costo o llenar un formulario para que el seguro cubra el medicamento que se considera necesario.   Si se requiere una autorizacin previa para que su compaa de seguros cubra su medicamento, por favor permtanos de 1 a 2 das hbiles para completar este proceso.  Los precios de los medicamentos varan con frecuencia dependiendo del lugar de dnde se surte la receta y alguna farmacias pueden ofrecer precios ms baratos.  El sitio web www.goodrx.com tiene cupones para medicamentos de diferentes farmacias. Los precios aqu no tienen en cuenta lo que podra costar con la ayuda del seguro (puede ser ms barato con su seguro), pero el sitio web puede darle el precio si no utiliz ningn seguro.  - Puede imprimir el cupn correspondiente y llevarlo con su receta a la farmacia.  - Tambin puede pasar por nuestra oficina durante el horario de atencin regular y recoger una tarjeta de cupones de GoodRx.  -   Si necesita que su receta se enve electrnicamente a una farmacia diferente, informe a nuestra oficina a travs de MyChart de [place] o por telfono llamando al [phone] y presione la opcin 4.  

## 2022-09-29 ENCOUNTER — Encounter: Payer: Self-pay | Admitting: Dermatology

## 2022-10-24 ENCOUNTER — Other Ambulatory Visit: Payer: Self-pay | Admitting: Family Medicine

## 2022-11-21 ENCOUNTER — Encounter: Payer: Self-pay | Admitting: Family Medicine

## 2022-11-26 ENCOUNTER — Other Ambulatory Visit: Payer: PPO

## 2022-11-26 DIAGNOSIS — C61 Malignant neoplasm of prostate: Secondary | ICD-10-CM | POA: Diagnosis not present

## 2022-11-27 ENCOUNTER — Other Ambulatory Visit: Payer: Self-pay | Admitting: Family Medicine

## 2022-11-27 LAB — PSA: Prostate Specific Ag, Serum: <0.1 ng/mL (ref 0.0–4.0)

## 2022-11-29 ENCOUNTER — Ambulatory Visit: Payer: PPO | Admitting: Urology

## 2022-12-07 ENCOUNTER — Ambulatory Visit: Payer: PPO | Admitting: Urology

## 2022-12-07 ENCOUNTER — Encounter: Payer: Self-pay | Admitting: Urology

## 2022-12-07 ENCOUNTER — Encounter: Payer: Self-pay | Admitting: Family Medicine

## 2022-12-07 VITALS — BP 150/75 | HR 72 | Ht 68.0 in | Wt 170.0 lb

## 2022-12-07 DIAGNOSIS — Z8546 Personal history of malignant neoplasm of prostate: Secondary | ICD-10-CM

## 2022-12-07 NOTE — Progress Notes (Signed)
12/07/2022 12:53 PM   Shane Stewart December 21, 1945 [id number]  Referring provider: Tonia Ghent, MD [place],  [place] [address]  No chief complaint on file.   Urologic problem list: - T2 high risk prostate cancer diagnosed June 2018; PSA 6.3 and 1 cm nodule at right apex. Pathology: (Right apex w/ nodule on DRE) Gleason 4+4 = 8 in 4% of the left lateral apex Gleason 4+3 = 7 in 40% and 27% of the left lateral base and left medial mid Gleason 3+3 = 6 prostate cancer in 7% of the left medial apex.   Underwent brachytherapy/IMRT completed 06/2017; along with androgen deprivation    HPI: 77 y.o. male presents for annual follow-up.  No problems since last years visit Denies bothersome LUTS Denies dysuria, gross hematuria No flank, abdominal or pelvic pain PSA 11/26/2022 remains undetectable at < 0.1   PMH: Past Medical History:  Diagnosis Date   Actinic keratoses    Allergy    spring, fall   Cancer [facility])    prostate   Cataract 12/12/2017   right eye forming    Diverticulosis of colon 01/1999   History of CT scan 04/1985   L/S, H/P L5/S1   Hyperlipidemia    currently ok    Prostate cancer ([place])    Right knee pain    better with hyaluronic acid   Rosacea     Surgical History: Past Surgical History:  Procedure Laterality Date   APPENDECTOMY     COLONOSCOPY     CYSTOSCOPY N/A 07/16/2017   Procedure: CYSTOSCOPY;  Surgeon: Hollice Espy, MD;  Location: [facility] ORS;  Service: Urology;  Laterality: N/A;   LUMBAR SPINE SURGERY  1986   LS ruptured disc repair   POLYPECTOMY     RADIOACTIVE SEED IMPLANT N/A 07/16/2017   Procedure: RADIOACTIVE SEED IMPLANT/BRACHYTHERAPY IMPLANT;  Surgeon: Hollice Espy, MD;  Location: [facility] ORS;  Service: Urology;  Laterality: N/A;   RADIOACTIVE SEED IMPLANT     Prostate- [place]   skin excision  2020   multiple    TONSILLECTOMY     UMBILICAL HERNIA REPAIR  10/05/2005    Home Medications:   Allergies as of 12/07/2022   No Known Allergies      Medication List        Accurate as of December 07, 2022 12:53 PM. If you have any questions, ask your nurse or doctor.          ascorbic acid 500 MG tablet Commonly known as: VITAMIN C Take 500 mg by mouth daily.   Cholecalciferol 50 MCG (2000 UT) Tabs Take 2,000 Units by mouth daily.   cyanocobalamin 1000 MCG tablet Commonly known as: VITAMIN B12 Take 1,000 mcg by mouth daily.   folic acid A999333 MCG tablet Commonly known as: FOLVITE Take 400 mcg by mouth daily.   metroNIDAZOLE 0.75 % gel Commonly known as: METROGEL Apply 1 Application topically daily.   omeprazole 20 MG capsule Commonly known as: PRILOSEC TAKE 1 CAPSULE BY MOUTH EVERY DAY   sildenafil 20 MG tablet Commonly known as: REVATIO TAKE 3-5 TABLETS BY MOUTH DAILY AS NEEDED   zinc gluconate 50 MG tablet Take 50 mg by mouth daily.        Allergies: No Known Allergies  Family History: Family History  Problem Relation Age of Onset   Alzheimer's disease Mother    Dementia Mother    Heart disease Father        CAD  Diabetes Father    Cancer - Colon Father 85       colon   Colon cancer Father    Diabetes Sister    Heart disease Sister        MI CABG x 3   Hypertension Sister    Prostate cancer Neg Hx    Esophageal cancer Neg Hx    Rectal cancer Neg Hx    Stomach cancer Neg Hx     Social History:  reports that he has never smoked. He has never used smokeless tobacco. He reports that he does not drink alcohol and does not use drugs.   Physical Exam: There were no vitals taken for this visit.  Constitutional:  Alert and oriented, No acute distress. Neurologic: Grossly intact, no focal deficits, moving all 4 extremities. Psychiatric: Normal mood and affect.   Assessment & Plan:    1.  History T2 high risk prostate cancer s/p EBRT + brachytherapy + ADT PSA remains undetectable >5 years posttreatment Offered continued annual visits  here or ask Dr. Damita Dunnings to check PSA annually and to return for detectable/rising PSA.  He has elected the latter   Abbie Sons, MD  [place] [address], [place], [place] [address] [phone]

## 2022-12-10 ENCOUNTER — Other Ambulatory Visit: Payer: Self-pay | Admitting: Family Medicine

## 2022-12-10 DIAGNOSIS — Z125 Encounter for screening for malignant neoplasm of prostate: Secondary | ICD-10-CM

## 2022-12-11 ENCOUNTER — Encounter: Payer: Self-pay | Admitting: Family Medicine

## 2022-12-11 ENCOUNTER — Ambulatory Visit (INDEPENDENT_AMBULATORY_CARE_PROVIDER_SITE_OTHER): Payer: PPO | Admitting: Family Medicine

## 2022-12-11 VITALS — BP 138/70 | HR 77 | Temp 97.5°F | Ht 68.0 in | Wt 181.0 lb

## 2022-12-11 DIAGNOSIS — K219 Gastro-esophageal reflux disease without esophagitis: Secondary | ICD-10-CM | POA: Diagnosis not present

## 2022-12-11 MED ORDER — PANTOPRAZOLE SODIUM 20 MG PO TBEC: 20.0000 mg | DELAYED_RELEASE_TABLET | Freq: Every day | ORAL | 1 refills | Status: DC | PRN

## 2022-12-11 NOTE — Patient Instructions (Signed)
Try pantoprazole as needed and let me know if that isn't helping or tolerated.  Goal BP <140/<90.  Take care.  Glad to see you.

## 2022-12-11 NOTE — Progress Notes (Unsigned)
Elevated BP on multiple checks.  Normalized today.  No CP.  Not SOB.  No BLE edema.  Still walking 3 miles w/o troubles.  Started omeprazole in December.  BP improved off med.  No extra salt, no other med changes.  He feels well o/w.  Can get slightly lightheaded upon quick standing, if he has been laying down.  No syncope.    He was able to use pantoprazole w/o BP elevation.    Meds, vitals, and allergies reviewed.   ROS: Per HPI unless specifically indicated in ROS section   GEN: nad, alert and oriented HEENT: ncat NECK: supple w/o LA CV: rrr. PULM: ctab, no inc wob ABD: soft, +bs EXT: no edema SKIN: no acute rash

## 2022-12-12 NOTE — Assessment & Plan Note (Signed)
There are case reports of blood pressure elevation noted with omeprazole use.  It was not reported with pantoprazole.  Discussed options with patient and the fact that both medications are in the same class.  He can try pantoprazole as needed and let me know if that isn't helping or tolerated.  Goal BP <140/<90.  He agrees to plan.

## 2023-01-05 ENCOUNTER — Other Ambulatory Visit: Payer: Self-pay | Admitting: Family Medicine

## 2023-03-08 ENCOUNTER — Other Ambulatory Visit: Payer: Self-pay | Admitting: Family Medicine

## 2023-03-08 DIAGNOSIS — E7800 Pure hypercholesterolemia, unspecified: Secondary | ICD-10-CM

## 2023-03-08 DIAGNOSIS — R7309 Other abnormal glucose: Secondary | ICD-10-CM

## 2023-03-08 DIAGNOSIS — Z8546 Personal history of malignant neoplasm of prostate: Secondary | ICD-10-CM

## 2023-03-08 DIAGNOSIS — Z8639 Personal history of other endocrine, nutritional and metabolic disease: Secondary | ICD-10-CM

## 2023-03-11 ENCOUNTER — Other Ambulatory Visit (INDEPENDENT_AMBULATORY_CARE_PROVIDER_SITE_OTHER): Payer: PPO

## 2023-03-11 DIAGNOSIS — E7800 Pure hypercholesterolemia, unspecified: Secondary | ICD-10-CM

## 2023-03-11 DIAGNOSIS — Z8546 Personal history of malignant neoplasm of prostate: Secondary | ICD-10-CM

## 2023-03-11 DIAGNOSIS — Z125 Encounter for screening for malignant neoplasm of prostate: Secondary | ICD-10-CM | POA: Diagnosis not present

## 2023-03-11 DIAGNOSIS — R7309 Other abnormal glucose: Secondary | ICD-10-CM | POA: Diagnosis not present

## 2023-03-11 DIAGNOSIS — Z8639 Personal history of other endocrine, nutritional and metabolic disease: Secondary | ICD-10-CM

## 2023-03-11 LAB — VITAMIN D 25 HYDROXY (VIT D DEFICIENCY, FRACTURES): VITD: 25.02 ng/mL — ABNORMAL LOW (ref 30.00–100.00)

## 2023-03-11 LAB — COMPREHENSIVE METABOLIC PANEL
ALT: 17 U/L (ref 0–53)
AST: 14 U/L (ref 0–37)
Albumin: 4.1 g/dL (ref 3.5–5.2)
Alkaline Phosphatase: 81 U/L (ref 39–117)
BUN: 14 mg/dL (ref 6–23)
CO2: 30 mEq/L (ref 19–32)
Calcium: 9.2 mg/dL (ref 8.4–10.5)
Chloride: 103 mEq/L (ref 96–112)
Creatinine, Ser: 0.98 mg/dL (ref 0.40–1.50)
GFR: 75.19 mL/min (ref >60.00)
Glucose, Bld: 102 mg/dL — ABNORMAL HIGH (ref 70–99)
Potassium: 4.5 mEq/L (ref 3.5–5.1)
Sodium: 138 mEq/L (ref 135–145)
Total Bilirubin: 0.8 mg/dL (ref 0.2–1.2)
Total Protein: 6.4 g/dL (ref 6.0–8.3)

## 2023-03-11 LAB — LIPID PANEL
Cholesterol: 185 mg/dL (ref 0–200)
HDL: 50.3 mg/dL (ref >39.00)
LDL Cholesterol: 116 mg/dL — ABNORMAL HIGH (ref 0–99)
NonHDL: 134.21
Total CHOL/HDL Ratio: 4
Triglycerides: 89 mg/dL (ref 0.0–149.0)
VLDL: 17.8 mg/dL (ref 0.0–40.0)

## 2023-03-11 LAB — HEMOGLOBIN A1C: Hgb A1c MFr Bld: 5.7 % (ref 4.6–6.5)

## 2023-03-11 LAB — PSA, MEDICARE: PSA: 0.01 ng/ml — ABNORMAL LOW (ref 0.10–4.00)

## 2023-03-18 ENCOUNTER — Ambulatory Visit (INDEPENDENT_AMBULATORY_CARE_PROVIDER_SITE_OTHER): Payer: PPO | Admitting: Family Medicine

## 2023-03-18 ENCOUNTER — Encounter: Payer: Self-pay | Admitting: Family Medicine

## 2023-03-18 VITALS — BP 124/64 | HR 69 | Temp 98.0°F | Ht 68.0 in | Wt 176.0 lb

## 2023-03-18 DIAGNOSIS — N529 Male erectile dysfunction, unspecified: Secondary | ICD-10-CM

## 2023-03-18 DIAGNOSIS — Z0000 Encounter for general adult medical examination without abnormal findings: Secondary | ICD-10-CM | POA: Diagnosis not present

## 2023-03-18 DIAGNOSIS — Z7189 Other specified counseling: Secondary | ICD-10-CM

## 2023-03-18 DIAGNOSIS — L719 Rosacea, unspecified: Secondary | ICD-10-CM | POA: Diagnosis not present

## 2023-03-18 DIAGNOSIS — K219 Gastro-esophageal reflux disease without esophagitis: Secondary | ICD-10-CM | POA: Diagnosis not present

## 2023-03-18 DIAGNOSIS — Z8639 Personal history of other endocrine, nutritional and metabolic disease: Secondary | ICD-10-CM

## 2023-03-18 MED ORDER — SILDENAFIL CITRATE 20 MG PO TABS: ORAL_TABLET | ORAL | 12 refills | Status: DC

## 2023-03-18 MED ORDER — METRONIDAZOLE 0.75 % EX GEL: 1.0000 | Freq: Every day | CUTANEOUS | 12 refills | Status: DC

## 2023-03-18 MED ORDER — CHOLECALCIFEROL 50 MCG (2000 UT) PO TABS: 4000.0000 [IU] | ORAL_TABLET | Freq: Every day | ORAL | Status: AC

## 2023-03-18 NOTE — Progress Notes (Signed)
I have personally reviewed the Medicare Annual Wellness questionnaire and have noted 1. The patient's medical and social history 2. Their use of alcohol, tobacco or illicit drugs 3. Their current medications and supplements 4. The patient's functional ability including ADL's, fall risks, home safety risks and hearing or visual             impairment. 5. Diet and physical activities 6. Evidence for depression or mood disorders  The patients weight, height, BMI have been recorded in the chart and visual acuity is per eye clinic.  I have made referrals, counseling and provided education to the patient based review of the above and I have provided the pt with a written personalized care plan for preventive services.  Provider list updated- see scanned forms.  Routine anticipatory guidance given to patient.  See health maintenance. The possibility exists that previously documented standard health maintenance information may have been brought forward from a previous encounter into this note.  If needed, that same information has been updated to reflect the current situation based on today's encounter.    Flu Shingles PNA Tetanus Colon  Breast cancer screening Prostate cancer screening Advance directive Cognitive function addressed- see scanned forms- and if abnormal then additional documentation follows.   In addition to [facility] Wellness, follow up visit for the below conditions:  D/w pt about potential cardiac screening.    Taking protonix every 2-3 days as needed. Swallowing well.    Metrogel daily.  It helps with rosacea.    Sildenafil used prn w/o ADE on med.  No NTG use.    He is still volunteering with disaster relief.    PMH and SH reviewed  Meds, vitals, and allergies reviewed.   ROS: Per HPI.  Unless specifically indicated otherwise in HPI, the patient denies:  General: fever. Eyes: acute vision changes ENT: sore throat Cardiovascular: chest pain Respiratory:  SOB GI: vomiting GU: dysuria Musculoskeletal: acute back pain Derm: acute rash Neuro: acute motor dysfunction Psych: worsening mood Endocrine: polydipsia Heme: bleeding Allergy: hayfever  GEN: nad, alert and oriented HEENT: ncat NECK: supple w/o LA CV: rrr. PULM: ctab, no inc wob ABD: soft, +bs EXT: no edema SKIN: no acute rash

## 2023-03-18 NOTE — Patient Instructions (Addendum)
Let me know if you want to get the cardiac screening set up.   Take care.  Glad to see you. Thanks for your effort.   [email or web address]

## 2023-03-20 ENCOUNTER — Encounter: Payer: Self-pay | Admitting: Family Medicine

## 2023-03-20 DIAGNOSIS — Z0000 Encounter for general adult medical examination without abnormal findings: Secondary | ICD-10-CM

## 2023-03-20 HISTORY — DX: Encounter for general adult medical examination without abnormal findings: Z00.00

## 2023-03-20 NOTE — Assessment & Plan Note (Signed)
Continue rosacea.

## 2023-03-20 NOTE — Assessment & Plan Note (Signed)
Would continue Protonix as is.

## 2023-03-20 NOTE — Assessment & Plan Note (Signed)
Advance directive- wife designated if patient were incapacitated.  

## 2023-03-20 NOTE — Assessment & Plan Note (Signed)
Continue sildenafil as needed. 

## 2023-03-20 NOTE — Assessment & Plan Note (Signed)
Flu 2023 Shingles previously done PNA previously done Tetanus 2021 COVID-vaccine previously done Colonoscopy 2020 Prostate cancer screening-2023, discussed getting PSA done here yearly. Advance directive-wife designated if patient were incapacitated. Cognitive function addressed- see scanned forms- and if abnormal then additional documentation follows.

## 2023-04-21 ENCOUNTER — Encounter: Payer: Self-pay | Admitting: Family Medicine

## 2023-04-24 ENCOUNTER — Other Ambulatory Visit: Payer: Self-pay | Admitting: Family Medicine

## 2023-04-24 DIAGNOSIS — E785 Hyperlipidemia, unspecified: Secondary | ICD-10-CM

## 2023-05-02 ENCOUNTER — Ambulatory Visit
Admission: RE | Admit: 2023-05-02 | Discharge: 2023-05-02 | Disposition: A | Discharge status: Home / Self Care | Payer: Self-pay | Source: Ambulatory Visit | Attending: Family Medicine | Admitting: Family Medicine

## 2023-05-02 DIAGNOSIS — E785 Hyperlipidemia, unspecified: Secondary | ICD-10-CM | POA: Insufficient documentation

## 2023-05-04 ENCOUNTER — Encounter: Payer: Self-pay | Admitting: Family Medicine

## 2023-05-07 ENCOUNTER — Other Ambulatory Visit: Payer: Self-pay | Admitting: Family Medicine

## 2023-05-07 MED ORDER — ASPIRIN 81 MG PO TBEC: 81.0000 mg | DELAYED_RELEASE_TABLET | Freq: Every day | ORAL | Status: AC

## 2023-05-08 ENCOUNTER — Other Ambulatory Visit: Payer: Self-pay | Admitting: Family Medicine

## 2023-05-08 ENCOUNTER — Encounter: Payer: Self-pay | Admitting: Family Medicine

## 2023-05-08 DIAGNOSIS — I2510 Atherosclerotic heart disease of native coronary artery without angina pectoris: Secondary | ICD-10-CM | POA: Insufficient documentation

## 2023-05-08 MED ORDER — ATORVASTATIN CALCIUM 10 MG PO TABS: 10.0000 mg | ORAL_TABLET | Freq: Every day | ORAL | 3 refills | Status: DC

## 2023-05-09 NOTE — Telephone Encounter (Signed)
Duplicate message from patient addressed in open encounter.  No further action needed at this time.

## 2023-05-10 ENCOUNTER — Encounter: Payer: Self-pay | Admitting: Family Medicine

## 2023-05-12 NOTE — Telephone Encounter (Signed)
Is it possible for this patient's cardiology appointment to get moved sooner?  Many thanks.

## 2023-05-20 DIAGNOSIS — H52222 Regular astigmatism, left eye: Secondary | ICD-10-CM | POA: Diagnosis not present

## 2023-05-20 DIAGNOSIS — D3132 Benign neoplasm of left choroid: Secondary | ICD-10-CM | POA: Diagnosis not present

## 2023-05-20 DIAGNOSIS — H5211 Myopia, right eye: Secondary | ICD-10-CM | POA: Diagnosis not present

## 2023-05-20 DIAGNOSIS — H2513 Age-related nuclear cataract, bilateral: Secondary | ICD-10-CM | POA: Diagnosis not present

## 2023-05-20 DIAGNOSIS — H524 Presbyopia: Secondary | ICD-10-CM | POA: Diagnosis not present

## 2023-05-21 DIAGNOSIS — H2513 Age-related nuclear cataract, bilateral: Secondary | ICD-10-CM | POA: Diagnosis not present

## 2023-05-21 DIAGNOSIS — H25013 Cortical age-related cataract, bilateral: Secondary | ICD-10-CM | POA: Diagnosis not present

## 2023-05-21 DIAGNOSIS — H18413 Arcus senilis, bilateral: Secondary | ICD-10-CM | POA: Diagnosis not present

## 2023-05-21 DIAGNOSIS — H25043 Posterior subcapsular polar age-related cataract, bilateral: Secondary | ICD-10-CM | POA: Diagnosis not present

## 2023-05-21 DIAGNOSIS — H2511 Age-related nuclear cataract, right eye: Secondary | ICD-10-CM | POA: Diagnosis not present

## 2023-05-21 NOTE — Telephone Encounter (Signed)
I thank all involved. 

## 2023-05-31 ENCOUNTER — Encounter: Payer: Self-pay | Admitting: Cardiology

## 2023-05-31 ENCOUNTER — Ambulatory Visit: Payer: PPO | Attending: Cardiology | Admitting: Cardiology

## 2023-05-31 VITALS — BP 132/72 | HR 67 | Ht 68.0 in | Wt 175.6 lb

## 2023-05-31 DIAGNOSIS — I2510 Atherosclerotic heart disease of native coronary artery without angina pectoris: Secondary | ICD-10-CM

## 2023-05-31 DIAGNOSIS — E7800 Pure hypercholesterolemia, unspecified: Secondary | ICD-10-CM | POA: Diagnosis not present

## 2023-05-31 MED ORDER — ATORVASTATIN CALCIUM 20 MG PO TABS: 20.0000 mg | ORAL_TABLET | Freq: Every day | ORAL | 0 refills | Status: DC

## 2023-05-31 NOTE — Patient Instructions (Addendum)
Medication Instructions:   INCREASE Atorvastatin - Take one tablet ( 20mg ) by mouth daily.   *If you need a refill on your cardiac medications before your next appointment, please call your pharmacy*   Lab Work:  None Ordered  If you have labs (blood work) drawn today and your tests are completely normal, you will receive your results only by: MyChart Message (if you have MyChart) OR A paper copy in the mail If you have any lab test that is abnormal or we need to change your treatment, we will call you to review the results.   Testing/Procedures:  Your physician has requested that you have an echocardiogram. Echocardiography is a painless test that uses sound waves to create images of your heart. It provides your doctor with information about the size and shape of your heart and how well your heart's chambers and valves are working. This procedure takes approximately one hour. There are no restrictions for this procedure. Please do NOT wear cologne, perfume, aftershave, or lotions (deodorant is allowed). Please arrive 15 minutes prior to your appointment time.     Follow-Up: At [facility], you and your health needs are our priority.  As part of our continuing mission to provide you with exceptional heart care, we have created designated Provider Care Teams.  These Care Teams include your primary Cardiologist (physician) and Advanced Practice Providers (APPs -  Physician Assistants and Nurse Practitioners) who all work together to provide you with the care you need, when you need it.  We recommend signing up for the patient portal called "MyChart".  Sign up information is provided on this After Visit Summary.  MyChart is used to connect with patients for Virtual Visits (Telemedicine).  Patients are able to view lab/test results, encounter notes, upcoming appointments, etc.  Non-urgent messages can be sent to your provider as well.   To learn more about what you can do with  MyChart, go to [email or web address].    Your next appointment:   2 month(s)  Provider:   You may see Debbe Odea, MD or one of the following Advanced Practice Providers on your designated Care Team:   Nicolasa Ducking, NP Eula Listen, PA-C Cadence Fransico Michael, PA-C Charlsie Quest, NP

## 2023-05-31 NOTE — Progress Notes (Signed)
Cardiology Office Note:    Date:  05/31/2023   ID:  Shane Stewart, DOB 08-Jul-1946, MRN [id number]  PCP:  Joaquim Nam, MD    HeartCare Providers Cardiologist:  Debbe Odea, MD     Referring MD: Joaquim Nam, MD   Chief Complaint  Patient presents with   New Patient (Initial Visit)    Referral for cardiac evaluation of Coronary artery disease, unspecified vessel or lesion type with no cardiac history.     Shane Stewart is a 77 y.o. male who is being seen today for the evaluation of CAD at the request of Joaquim Nam, MD.   History of Present Illness:    Shane Stewart is a 77 y.o. male with a hx of CAD (coronary calcifications in LAD, left circumflex, RCA ), hyperlipidemia who presents due to elevated coronary calcium score.  Patient had a calcium score 05/02/2023 showing score of 984, 77th percentile.  He denies chest pain or shortness of breath.  Started on aspirin 81 mg, Lipitor 10 mg daily after coronary calcium score.  He is tolerating Lipitor with no adverse effects.  Sister had an MI in her 24s.  Past Medical History:  Diagnosis Date   Actinic keratoses    Allergy    spring, fall   Cancer [facility])    prostate   Cataract 12/12/2017   right eye forming    Diverticulosis of colon 01/1999   GERD (gastroesophageal reflux disease)    History of CT scan 04/1985   L/S, H/P L5/S1   Hyperlipidemia    Medicare annual wellness visit, subsequent 03/20/2023   Prostate cancer ([facility])    Right knee pain    better with hyaluronic acid   Rosacea     Past Surgical History:  Procedure Laterality Date   APPENDECTOMY     COLONOSCOPY     CYSTOSCOPY N/A 07/16/2017   Procedure: CYSTOSCOPY;  Surgeon: Vanna Scotland, MD;  Location: [facility] ORS;  Service: Urology;  Laterality: N/A;   LUMBAR SPINE SURGERY  1986   LS ruptured disc repair   POLYPECTOMY     RADIOACTIVE SEED IMPLANT N/A 07/16/2017   Procedure: RADIOACTIVE SEED IMPLANT/BRACHYTHERAPY IMPLANT;  Surgeon:  Vanna Scotland, MD;  Location: [facility] ORS;  Service: Urology;  Laterality: N/A;   RADIOACTIVE SEED IMPLANT     Prostate- [facility]   skin excision  2020   multiple    TONSILLECTOMY     UMBILICAL HERNIA REPAIR  10/05/2005    Current Medications: Current Meds  Medication Sig   aspirin EC 81 MG tablet Take 1 tablet (81 mg total) by mouth daily. Swallow whole.   Cholecalciferol 50 MCG (2000 UT) TABS Take 2 tablets (4,000 Units total) by mouth daily.   folic acid (FOLVITE) 400 MCG tablet Take 400 mcg by mouth daily.   metroNIDAZOLE (METROGEL) 0.75 % gel Apply 1 Application topically daily.   pantoprazole (PROTONIX) 20 MG tablet TAKE 1 TABLET BY MOUTH EVERY DAY AS NEEDED   sildenafil (REVATIO) 20 MG tablet TAKE 3-5 TABLETS BY MOUTH DAILY AS NEEDED   vitamin B-12 (CYANOCOBALAMIN) 1000 MCG tablet Take 1,000 mcg by mouth daily.   vitamin C (ASCORBIC ACID) 500 MG tablet Take 500 mg by mouth daily.   zinc gluconate 50 MG tablet Take 50 mg by mouth daily.   [DISCONTINUED] atorvastatin (LIPITOR) 10 MG tablet Take 1 tablet (10 mg total) by mouth daily.     Allergies:   Omeprazole   Social  History   Socioeconomic History   Marital status: Married    Spouse name: Not on file   Number of children: 0   Years of education: Not on file   Highest education level: Not on file  Occupational History   Occupation: MGR//Contractor Self Employed    Employer: retired  Tobacco Use   Smoking status: Never   Smokeless tobacco: Never  Vaping Use   Vaping status: Never Used  Substance and Sexual Activity   Alcohol use: No   Drug use: Never   Sexual activity: Yes  Other Topics Concern   Not on file  Social History Narrative   Remarried 2022.  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    Social Determinants of Health   Financial Resource Strain: Not on file  Food Insecurity: Not on file  Transportation Needs: Not on file  Physical Activity: Not on file  Stress: Not on file  Social Connections: Not on file     Family History: The patient's family history includes Alzheimer's disease in his mother; Cancer in his father, sister, and sister; Cancer - Colon (age of onset: 25) in his father; Colon cancer in his father; Dementia in his mother; Diabetes in his father and sister; Heart disease in his father and sister; Hypertension in his sister. There is no history of Prostate cancer, Esophageal cancer, Rectal cancer, or Stomach cancer.  ROS:   Please see the history of present illness.     All other systems reviewed and are negative.  EKGs/Labs/Other Studies Reviewed:    The following studies were reviewed today:  EKG Interpretation Date/Time:  Friday May 31 2023 13:16:50 EDT Ventricular Rate:  67 PR Interval:  156 QRS Duration:  72 QT Interval:  382 QTC Calculation: 403 R Axis:   -14  Text Interpretation: Normal sinus rhythm Normal ECG Confirmed by Debbe Odea ([id number]) on 05/31/2023 1:21:48 PM    Recent Labs: 03/11/2023: ALT 17; BUN 14; Creatinine, Ser 0.98; Potassium 4.5; Sodium 138  Recent Lipid Panel    Component Value Date/Time   CHOL 185 03/11/2023 0801   TRIG 89.0 03/11/2023 0801   HDL 50.30 03/11/2023 0801   CHOLHDL 4 03/11/2023 0801   VLDL 17.8 03/11/2023 0801   LDLCALC 116 (H) 03/11/2023 0801   LDLDIRECT 158.3 09/13/2008 0910     Risk Assessment/Calculations:             Physical Exam:    VS:  BP 132/72 (BP Location: Left Arm, Patient Position: Sitting, Cuff Size: Normal)   Pulse 67   Ht 5\' 8"  (1.727 m)   Wt 175 lb 9.6 oz (79.7 kg)   SpO2 97%   BMI 26.70 kg/m     Wt Readings from Last 3 Encounters:  05/31/23 175 lb 9.6 oz (79.7 kg)  03/18/23 176 lb (79.8 kg)  12/11/22 181 lb  (82.1 kg)     GEN:  Well nourished, well developed in no acute distress HEENT: Normal NECK: No JVD; No carotid bruits CARDIAC: RRR, no murmurs, rubs, gallops RESPIRATORY:  Clear to auscultation without rales, wheezing or rhonchi  ABDOMEN: Soft, non-tender, non-distended MUSCULOSKELETAL:  No edema; No deformity  SKIN: Warm and dry NEUROLOGIC:  Alert and oriented x 3  PSYCHIATRIC:  Normal affect   ASSESSMENT:    1. Coronary artery disease, unspecified vessel or lesion type, unspecified whether angina present, unspecified whether native or transplanted heart   2. Pure hypercholesterolemia    PLAN:    In order of problems listed above:  CAD, LAD, RCA, left circumflex calcifications.  Calcium score 984.  Increase Lipitor to 20 mg, continue aspirin 81 mg daily.  Obtain echocardiogram.  Consider ischemic workup if patient develops symptoms or EF is reduced. Hyperlipidemia, goal LDL less than 70.  Lipitor as above.  Plan to recheck lipid panel with PCP in about 6 weeks.  Titrate Lipitor as needed to achieve goal LDL.  Follow-up after echo     Medication Adjustments/Labs and Tests Ordered: Current medicines are reviewed at length with the patient today.  Concerns regarding medicines are outlined above.  Orders Placed This Encounter  Procedures   EKG 12-Lead   ECHOCARDIOGRAM COMPLETE   Meds ordered this encounter  Medications   atorvastatin (LIPITOR) 20 MG tablet    Sig: Take 1 tablet (20 mg total) by mouth daily.    Dispense:  90 tablet    Refill:  0    Patient Instructions  Medication Instructions:   INCREASE Atorvastatin - Take one tablet ( 20mg ) by mouth daily.   *If you need a refill on your cardiac medications before your next appointment, please call your pharmacy*   Lab Work:  None Ordered  If you have labs (blood work) drawn today and your tests are completely normal, you will receive your results only by: MyChart Message (if you have MyChart) OR A paper copy  in the mail If you have any lab test that is abnormal or we need to change your treatment, we will call you to review the results.   Testing/Procedures:  Your physician has requested that you have an echocardiogram. Echocardiography is a painless test that uses sound waves to create images of your heart. It provides your doctor with information about the size and shape of your heart and how well your heart's chambers and valves are working. This procedure takes approximately one hour. There are no restrictions for this procedure. Please do NOT wear cologne, perfume, aftershave, or lotions (deodorant is allowed). Please arrive 15 minutes prior to your appointment time.     Follow-Up: At [facility], you and your health needs are our priority.  As part of our continuing mission to provide you with exceptional heart care, we have created designated Provider Care Teams.  These Care Teams include your primary Cardiologist (physician) and Advanced Practice Providers (APPs -  Physician Assistants and Nurse Practitioners) who all work together to provide you with the care you need, when you need it.  We recommend signing up for the patient portal called "MyChart".  Sign up information is provided on this After Visit Summary.  MyChart is used to connect with patients for Virtual Visits (Telemedicine).  Patients are able to view lab/test results, encounter notes, upcoming appointments, etc.  Non-urgent messages can be sent to your provider as well.   To learn more about what you can do with MyChart, go to [email or web address].    Your next appointment:   2 month(s)  Provider:   You may see Debbe Odea, MD or one of the following Advanced Practice Providers on your designated Care Team:   Nicolasa Ducking, NP Eula Listen, PA-C Cadence Fransico Michael, PA-C Charlsie Quest, NP   Signed, Debbe Odea, MD  05/31/2023 2:46 PM  [facility]

## 2023-06-17 ENCOUNTER — Ambulatory Visit (HOSPITAL_COMMUNITY): Payer: PPO | Attending: Cardiology

## 2023-06-17 DIAGNOSIS — I2510 Atherosclerotic heart disease of native coronary artery without angina pectoris: Secondary | ICD-10-CM | POA: Diagnosis not present

## 2023-06-17 LAB — ECHOCARDIOGRAM COMPLETE
Area-P 1/2: 3.4 cm2
S' Lateral: 2.7 cm

## 2023-06-17 NOTE — Progress Notes (Unsigned)
Cardiology Office Note    Date:  06/18/2023   ID:  Shane Stewart, DOB 15-Jul-1946, MRN [id number]  PCP:  Joaquim Nam, MD  Cardiologist:  Debbe Odea, MD  Electrophysiologist:  None   Chief Complaint: Follow-up  History of Present Illness:   Shane Stewart is a 77 y.o. male with history of CAD with coronary calcification in the LAD, LCx, and RCA on recent calcium score, HLD, prostate cancer, and GERD who presents for follow-up of echo.  Calcium score in 04/2023 of 984 which was the 77th percentile.  In this setting, he was referred to cardiology for risk stratification.  He established care with Dr. Azucena Cecil on 05/31/2023 and was tolerating recently initiated atorvastatin and aspirin.  He reported he had a sister with an MI in her 42s.  He was without symptoms of angina or cardiac decompensation.  Echo on 06/17/2023 showed an EF of 60 to 65%, no regional wall motion abnormalities, mild LVH, grade 1 diastolic dysfunction, normal RV systolic function and ventricular cavity size, no significant valvular abnormalities, and an estimated right atrial pressure of 3 mmHg.  He comes in accompanied by his wife today and is doing well from a cardiac perspective.  He is without symptoms of angina or cardiac decompensation.  He remains very active at baseline with high intensity interval training several days per week without cardiac limitation.  No palpitations, dizziness, presyncope, or syncope.  Adherent and tolerating aspirin and atorvastatin without issues.  Continues to improve diet.   Labs independently reviewed: 02/2023 - potassium 4.5, BUN 14, serum creatinine 0.98, albumin 4.1, AST/ALT normal, TC 185, TG 89, HDL 50, LDL 116, A1c 5.7 06/2017 - Hgb 15.1, PLT 139  Past Medical History:  Diagnosis Date   Actinic keratoses    Allergy    spring, fall   Cancer [facility])    prostate   Cataract 12/12/2017   right eye forming    Diverticulosis of colon 01/1999   GERD (gastroesophageal reflux  disease)    History of CT scan 04/1985   L/S, H/P L5/S1   Hyperlipidemia    Medicare annual wellness visit, subsequent 03/20/2023   Prostate cancer (HCC)    Right knee pain    better with hyaluronic acid   Rosacea     Past Surgical History:  Procedure Laterality Date   APPENDECTOMY     COLONOSCOPY     CYSTOSCOPY N/A 07/16/2017   Procedure: CYSTOSCOPY;  Surgeon: Vanna Scotland, MD;  Location: [facility] ORS;  Service: Urology;  Laterality: N/A;   LUMBAR SPINE SURGERY  1986   LS ruptured disc repair   POLYPECTOMY     RADIOACTIVE SEED IMPLANT N/A 07/16/2017   Procedure: RADIOACTIVE SEED IMPLANT/BRACHYTHERAPY IMPLANT;  Surgeon: Vanna Scotland, MD;  Location: [facility] ORS;  Service: Urology;  Laterality: N/A;   RADIOACTIVE SEED IMPLANT     Prostate- [facility]   skin excision  2020   multiple    TONSILLECTOMY     UMBILICAL HERNIA REPAIR  10/05/2005    Current Medications: Current Meds  Medication Sig   aspirin EC 81 MG tablet Take 1 tablet (81 mg total) by mouth daily. Swallow whole.   atorvastatin (LIPITOR) 20 MG tablet Take 1 tablet (20 mg total) by mouth daily.   Cholecalciferol 50 MCG (2000 UT) TABS Take 2 tablets (4,000 Units total) by mouth daily.   folic acid (FOLVITE) 400 MCG tablet Take 400 mcg by mouth daily.   metroNIDAZOLE (METROGEL) 0.75 % gel  Apply 1 Application topically daily.   pantoprazole (PROTONIX) 20 MG tablet TAKE 1 TABLET BY MOUTH EVERY DAY AS NEEDED   sildenafil (REVATIO) 20 MG tablet TAKE 3-5 TABLETS BY MOUTH DAILY AS NEEDED   vitamin B-12 (CYANOCOBALAMIN) 1000 MCG tablet Take 1,000 mcg by mouth daily.   vitamin C (ASCORBIC ACID) 500 MG tablet Take 500 mg by mouth daily.   zinc gluconate 50 MG tablet Take 50 mg by mouth daily.    Allergies:   Omeprazole   Social History   Socioeconomic History   Marital status: Married    Spouse name: Not on file   Number of children: 0   Years of education: Not on file   Highest education level: Not on  file  Occupational History   Occupation: MGR//Contractor Self Employed    Employer: retired  Tobacco Use   Smoking status: Never   Smokeless tobacco: Never  Vaping Use   Vaping status: Never Used  Substance and Sexual Activity   Alcohol use: No   Drug use: Never   Sexual activity: Yes  Other Topics Concern   Not on file  Social History Narrative   Remarried 2022.  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    Social Determinants of Health   Financial Resource Strain: Not on file  Food Insecurity: Not on file  Transportation Needs: Not on file  Physical Activity: Not on file  Stress: Not on file  Social Connections: Not on file     Family History:  The patient's family history includes Alzheimer's disease in his mother; Cancer in his father, sister, and sister; Cancer - Colon (age of onset: 74) in his father; Colon cancer in his father; Dementia in his mother; Diabetes in his father and sister; Heart disease in his father and sister; Hypertension in his sister. There is no history of Prostate cancer, Esophageal cancer, Rectal cancer, or Stomach cancer.  ROS:   12-point review of systems is negative unless otherwise noted in the HPI.   EKGs/Labs/Other Studies Reviewed:    Studies reviewed were summarized above. The additional studies were reviewed today:  2D echo 06/17/2023: 1. Left ventricular ejection fraction, by estimation, is 60 to 65%. The  left ventricle has normal function. The left ventricle has no regional  wall motion abnormalities. There is mild left ventricular hypertrophy.  Left ventricular diastolic parameters  are consistent with Grade I diastolic dysfunction (impaired relaxation).  The average left ventricular global  longitudinal strain is -19.6 %. The  global longitudinal strain is normal.   2. Right ventricular systolic function is normal. The right ventricular  size is normal.   3. The mitral valve is normal in structure. No evidence of mitral valve  regurgitation. No evidence of mitral stenosis.   4. The aortic valve is tricuspid. Aortic valve regurgitation is not  visualized. No aortic stenosis is present.   5. The inferior vena cava is normal in size with greater than 50%  respiratory variability, suggesting right atrial pressure of 3 mmHg.  __________  Calcium score 05/02/2023: Ascending Aorta: Normal size   Pericardium: Normal   Coronary arteries: Normal origin of left and right coronary arteries. Distribution of arterial calcifications if present, as noted below;  LM 0   LAD 228   LCx 733   RCA 23.5   Total 984   IMPRESSION AND RECOMMENDATION: 1. Coronary calcium score of 984. This was 77th percentile for age and sex matched control. 2. CAC >300 in LAD, LCx, RCA. CAC-DRS A3/N3. 3. Recommend aspirin and statin if no contraindication. 4. Recommend cardiology consultation. 5. Continue heart healthy lifestyle and risk factor modification.   EKG:  EKG is not ordered today.    Recent Labs: 03/11/2023: ALT 17; BUN 14; Creatinine, Ser 0.98; Potassium 4.5; Sodium 138  Recent Lipid Panel    Component Value Date/Time   CHOL 185 03/11/2023 0801   TRIG 89.0 03/11/2023 0801   HDL 50.30 03/11/2023 0801   CHOLHDL 4 03/11/2023 0801   VLDL 17.8 03/11/2023 0801   LDLCALC 116 (H) 03/11/2023 0801   LDLDIRECT 158.3 09/13/2008 0910    PHYSICAL EXAM:    VS:  BP 130/64 (BP Location: Left Arm, Patient Position: Sitting, Cuff Size: Normal)   Pulse 67   Ht 5\' 8"  (1.727 m)   Wt 177 lb 6.4 oz (80.5 kg)   SpO2 97%   BMI 26.97 kg/m   BMI: Body mass index is 26.97 kg/m.  Physical Exam Vitals reviewed.  Constitutional:      Appearance: He is well-developed.  HENT:     Head:  Normocephalic and atraumatic.  Eyes:     General:        Right eye: No discharge.        Left eye: No discharge.  Cardiovascular:     Rate and Rhythm: Normal rate and regular rhythm.     Heart sounds: Normal heart sounds, S1 normal and S2 normal. Heart sounds not distant. No midsystolic click and no opening snap. No murmur heard.    No friction rub.  Pulmonary:     Effort: Pulmonary effort is normal. No respiratory distress.     Breath sounds: Normal breath sounds. No decreased breath sounds, wheezing or rales.  Chest:     Chest wall: No tenderness.  Abdominal:     General: There is no distension.  Musculoskeletal:     Cervical back: Normal range of motion.     Right lower leg: No edema.     Left lower leg: No edema.  Skin:    General: Skin is warm and dry.     Nails: There is no clubbing.  Neurological:     Mental Status: He is alert and oriented to person, place, and time.  Psychiatric:        Speech: Speech normal.        Behavior: Behavior normal.        Thought Content: Thought content normal.        Judgment: Judgment normal.     Wt Readings from Last 3 Encounters:  06/18/23 177 lb 6.4 oz (80.5 kg)  05/31/23 175 lb 9.6 oz (79.7 kg)  03/18/23 176 lb (79.8 kg)     ASSESSMENT & PLAN:   CAD involving the native coronary arteries without angina: He is doing well and without symptoms concerning for angina or cardiac decompensation.  He remains very active at baseline with high intensity interval training several days per week without cardiac limitation.  Continue aggressive risk factor modification and primary prevention including aspirin and statin.  No indication for further cardiac testing at this time.  HLD: LDL 116 in 02/2023 with target LDL less than 70.  Tolerating atorvastatin 20 mg daily.  PCP will  be obtaining labs next month.  Recommend escalation of lipid-lowering therapy to achieve target LDL.  Obtain LP(a) for further risk stratification.    Disposition:  F/u with Dr. Azucena Cecil or an APP in 6 months.   Medication Adjustments/Labs and Tests Ordered: Current medicines are reviewed at length with the patient today.  Concerns regarding medicines are outlined above. Medication changes, Labs and Tests ordered today are summarized above and listed in the Patient Instructions accessible in Encounters.   Signed, Eula Listen, PA-C 06/18/2023 4:18 PM     [facility] [place] [address] [place], [place] [address] [phone]

## 2023-06-18 ENCOUNTER — Ambulatory Visit: Payer: PPO | Attending: Physician Assistant | Admitting: Physician Assistant

## 2023-06-18 ENCOUNTER — Encounter: Payer: Self-pay | Admitting: Physician Assistant

## 2023-06-18 VITALS — BP 130/64 | HR 67 | Ht 68.0 in | Wt 177.4 lb

## 2023-06-18 DIAGNOSIS — I2510 Atherosclerotic heart disease of native coronary artery without angina pectoris: Secondary | ICD-10-CM

## 2023-06-18 DIAGNOSIS — E785 Hyperlipidemia, unspecified: Secondary | ICD-10-CM | POA: Diagnosis not present

## 2023-06-18 NOTE — Patient Instructions (Signed)
Medication Instructions:  Your Physician recommend you continue on your current medication as directed.    *If you need a refill on your cardiac medications before your next appointment, please call your pharmacy*   Lab Work: Your provider would like for you to have following labs drawn today LP(a).   If you have labs (blood work) drawn today and your tests are completely normal, you will receive your results only by: MyChart Message (if you have MyChart) OR A paper copy in the mail If you have any lab test that is abnormal or we need to change your treatment, we will call you to review the results.   Follow-Up: At [facility], you and your health needs are our priority.  As part of our continuing mission to provide you with exceptional heart care, we have created designated Provider Care Teams.  These Care Teams include your primary Cardiologist (physician) and Advanced Practice Providers (APPs -  Physician Assistants and Nurse Practitioners) who all work together to provide you with the care you need, when you need it.  We recommend signing up for the patient portal called "MyChart".  Sign up information is provided on this After Visit Summary.  MyChart is used to connect with patients for Virtual Visits (Telemedicine).  Patients are able to view lab/test results, encounter notes, upcoming appointments, etc.  Non-urgent messages can be sent to your provider as well.   To learn more about what you can do with MyChart, go to [email or web address].    Your next appointment:   6 month(s)  Provider:   You may see Debbe Odea, MD or one of the following Advanced Practice Providers on your designated Care Team:   Eula Listen, [place]

## 2023-06-19 LAB — LIPOPROTEIN A (LPA): Lipoprotein (a): <8.4 nmol/L (ref <75.0)

## 2023-07-03 ENCOUNTER — Other Ambulatory Visit: Payer: Self-pay | Admitting: Family Medicine

## 2023-07-08 ENCOUNTER — Encounter: Payer: Self-pay | Admitting: Cardiology

## 2023-07-08 ENCOUNTER — Encounter: Payer: Self-pay | Admitting: Family Medicine

## 2023-07-09 ENCOUNTER — Other Ambulatory Visit (INDEPENDENT_AMBULATORY_CARE_PROVIDER_SITE_OTHER): Payer: PPO

## 2023-07-09 DIAGNOSIS — Z8639 Personal history of other endocrine, nutritional and metabolic disease: Secondary | ICD-10-CM

## 2023-07-09 DIAGNOSIS — I2510 Atherosclerotic heart disease of native coronary artery without angina pectoris: Secondary | ICD-10-CM | POA: Diagnosis not present

## 2023-07-09 LAB — LIPID PANEL
Cholesterol: 129 mg/dL (ref 0–200)
HDL: 53.4 mg/dL (ref >39.00)
LDL Cholesterol: 61 mg/dL (ref 0–99)
NonHDL: 75.68
Total CHOL/HDL Ratio: 2
Triglycerides: 75 mg/dL (ref 0.0–149.0)
VLDL: 15 mg/dL (ref 0.0–40.0)

## 2023-07-09 LAB — VITAMIN D 25 HYDROXY (VIT D DEFICIENCY, FRACTURES): VITD: 27.72 ng/mL — ABNORMAL LOW (ref 30.00–100.00)

## 2023-07-09 LAB — HEPATIC FUNCTION PANEL
ALT: 27 U/L (ref 0–53)
AST: 19 U/L (ref 0–37)
Albumin: 4.3 g/dL (ref 3.5–5.2)
Alkaline Phosphatase: 98 U/L (ref 39–117)
Bilirubin, Direct: 0.1 mg/dL (ref 0.0–0.3)
Total Bilirubin: 0.5 mg/dL (ref 0.2–1.2)
Total Protein: 6.4 g/dL (ref 6.0–8.3)

## 2023-07-14 ENCOUNTER — Other Ambulatory Visit: Payer: Self-pay | Admitting: Family Medicine

## 2023-07-14 DIAGNOSIS — Z8639 Personal history of other endocrine, nutritional and metabolic disease: Secondary | ICD-10-CM

## 2023-07-22 ENCOUNTER — Ambulatory Visit: Payer: PPO | Admitting: Cardiology

## 2023-07-22 DIAGNOSIS — H2512 Age-related nuclear cataract, left eye: Secondary | ICD-10-CM | POA: Diagnosis not present

## 2023-07-22 DIAGNOSIS — H2511 Age-related nuclear cataract, right eye: Secondary | ICD-10-CM | POA: Diagnosis not present

## 2023-07-22 DIAGNOSIS — Z961 Presence of intraocular lens: Secondary | ICD-10-CM | POA: Diagnosis not present

## 2023-07-22 DIAGNOSIS — H52209 Unspecified astigmatism, unspecified eye: Secondary | ICD-10-CM | POA: Diagnosis not present

## 2023-07-23 DIAGNOSIS — H2511 Age-related nuclear cataract, right eye: Secondary | ICD-10-CM | POA: Diagnosis not present

## 2023-08-05 DIAGNOSIS — H2511 Age-related nuclear cataract, right eye: Secondary | ICD-10-CM | POA: Diagnosis not present

## 2023-08-05 DIAGNOSIS — H2512 Age-related nuclear cataract, left eye: Secondary | ICD-10-CM | POA: Diagnosis not present

## 2023-08-05 DIAGNOSIS — Z961 Presence of intraocular lens: Secondary | ICD-10-CM | POA: Diagnosis not present

## 2023-08-19 ENCOUNTER — Ambulatory Visit
Admission: RE | Admit: 2023-08-19 | Discharge: 2023-08-19 | Disposition: A | Discharge status: Home / Self Care | Payer: PPO | Source: Ambulatory Visit

## 2023-08-19 VITALS — BP 118/75 | HR 100 | Temp 98.0°F | Resp 18

## 2023-08-19 DIAGNOSIS — U071 COVID-19: Secondary | ICD-10-CM

## 2023-08-19 MED ORDER — BENZONATATE 100 MG PO CAPS
200.0000 mg | ORAL_CAPSULE | Freq: Three times a day (TID) | ORAL | 0 refills | Status: DC | PRN
Start: 2023-08-19 — End: 2023-08-19

## 2023-08-19 MED ORDER — PAXLOVID (150/100) 10 X 150 MG & 10 X 100MG PO TBPK
2.0000 | ORAL_TABLET | Freq: Two times a day (BID) | ORAL | 0 refills | Status: AC
Start: 2023-08-19 — End: 2023-08-24

## 2023-08-19 MED ORDER — BENZONATATE 200 MG PO CAPS
200.0000 mg | ORAL_CAPSULE | Freq: Three times a day (TID) | ORAL | 0 refills | Status: DC | PRN
Start: 2023-08-19 — End: 2023-10-14

## 2023-08-19 NOTE — ED Provider Notes (Signed)
Shane Stewart    CSN: [id number] Arrival date & time: 08/19/23  1138      History   Chief Complaint Chief Complaint  Patient presents with   Fever    Tested positive For Covid withHome test - Entered by patient    HPI Shane Stewart is a 77 y.o. male.  Presents today for evaluation of COVID symptoms of cough and congestion after testing positive for COVID x 1 day ago with a home COVID test.  Patient reports symptoms started 3 days ago.  He denies any shortness of breath, fever, chest pain, generalized weakness or body aches.  Patient would like antiviral treatment. Past Medical History:  Diagnosis Date   Actinic keratoses    Allergy    spring, fall   Cancer [facility])    prostate   Cataract 12/12/2017   right eye forming    Diverticulosis of colon 01/1999   GERD (gastroesophageal reflux disease)    History of CT scan 04/1985   L/S, H/P L5/S1   Hyperlipidemia    Medicare annual wellness visit, subsequent 03/20/2023   Prostate cancer (HCC)    Right knee pain    better with hyaluronic acid   Rosacea     Patient Active Problem List   Diagnosis Date Noted   Coronary artery disease 05/08/2023   Medicare annual wellness visit, subsequent 03/20/2023   Gastroesophageal reflux disease 09/02/2022   History of prostate cancer 06/11/2022   Erectile dysfunction 12/25/2017   Dysphagia 12/25/2017   History of vitamin D deficiency 12/12/2017   Advance care planning 12/08/2014   DIVERTICULOSIS, COLON 09/08/2007   Rosacea 09/05/2007    Past Surgical History:  Procedure Laterality Date   APPENDECTOMY     CATARACT EXTRACTION     COLONOSCOPY     CYSTOSCOPY N/A 07/16/2017   Procedure: CYSTOSCOPY;  Surgeon: Vanna Scotland, MD;  Location: [facility] ORS;  Service: Urology;  Laterality: N/A;   LUMBAR SPINE SURGERY  1986   LS ruptured disc repair   POLYPECTOMY     RADIOACTIVE SEED IMPLANT N/A 07/16/2017   Procedure: RADIOACTIVE SEED IMPLANT/BRACHYTHERAPY IMPLANT;  Surgeon:  Vanna Scotland, MD;  Location: [facility] ORS;  Service: Urology;  Laterality: N/A;   RADIOACTIVE SEED IMPLANT     Prostate- [facility]   skin excision  2020   multiple    TONSILLECTOMY     UMBILICAL HERNIA REPAIR  10/05/2005       Home Medications    Prior to Admission medications   Medication Sig Start Date End Date Taking? Authorizing Provider  benzonatate (TESSALON) 100 MG capsule Take 2 capsules (200 mg total) by mouth 3 (three) times daily as needed for cough. 08/19/23  Yes Bing Neighbors, NP  ketorolac (ACULAR) 0.5 % ophthalmic solution Place 1 drop into the right eye 4 (four) times daily. 08/09/23  Yes [provider]  moxifloxacin (VIGAMOX) 0.5 % ophthalmic solution Place 1 drop into the right eye 4 (four) times daily. 08/08/23  Yes [provider]  nirmatrelvir/ritonavir, renal dosing, (PAXLOVID, 150/100,) 10 x 150 MG & 10 x 100MG  TBPK Take 2 tablets by mouth 2 (two) times daily for 5 days. Dosage for moderate renal impairment (eGFR >/= 30 to <60 mL/min): 150 mg nirmatrelvir (one 150 mg tablet) with 100 mg ritonavir (one 100 mg tablet), with both tablets taken together twice daily for 5 days. Not recommended if eGFR < 30 mL/min. PAXLOVID is not recommend in patients with severe hepatic impairment (Child-Pugh Class C).  08/19/23 08/24/23 Yes Bing Neighbors, NP  prednisoLONE acetate (PRED FORTE) 1 % ophthalmic suspension Place 1 drop into the left eye 4 (four) times daily. 08/08/23  Yes [provider]  aspirin EC 81 MG tablet Take 1 tablet (81 mg total) by mouth daily. Swallow whole. 05/07/23   Joaquim Nam, MD  atorvastatin (LIPITOR) 20 MG tablet Take 1 tablet (20 mg total) by mouth daily. 05/31/23   Debbe Odea, MD  Cholecalciferol 50 MCG (2000 UT) TABS Take 2 tablets (4,000 Units total) by mouth daily. 03/18/23   Joaquim Nam, MD  folic acid (FOLVITE) 400 MCG tablet Take 400 mcg by mouth daily.    [provider]   metroNIDAZOLE (METROGEL) 0.75 % gel Apply 1 Application topically daily. 03/18/23   Joaquim Nam, MD  pantoprazole (PROTONIX) 20 MG tablet TAKE 1 TABLET BY MOUTH EVERY DAY AS NEEDED 07/03/23   Joaquim Nam, MD  sildenafil (REVATIO) 20 MG tablet TAKE 3-5 TABLETS BY MOUTH DAILY AS NEEDED 03/18/23   Joaquim Nam, MD  vitamin B-12 (CYANOCOBALAMIN) 1000 MCG tablet Take 1,000 mcg by mouth daily.    [provider]  vitamin C (ASCORBIC ACID) 500 MG tablet Take 500 mg by mouth daily.    [provider]  zinc gluconate 50 MG tablet Take 50 mg by mouth daily.    [provider]    Family History Family History  Problem Relation Age of Onset   Alzheimer's disease Mother    Dementia Mother    Heart disease Father        CAD   Diabetes Father    Cancer - Colon Father 81       colon   Colon cancer Father    Cancer Father    Diabetes Sister    Heart disease Sister        MI CABG x 3   Hypertension Sister    Cancer Sister    Cancer Sister    Prostate cancer Neg Hx    Esophageal cancer Neg Hx    Rectal cancer Neg Hx    Stomach cancer Neg Hx     Social History Social History   Tobacco Use   Smoking status: Never   Smokeless tobacco: Never  Vaping Use   Vaping status: Never Used  Substance Use Topics   Alcohol use: No   Drug use: Never     Allergies   Omeprazole   Review of Systems Review of Systems  Constitutional:  Positive for fever.     Physical Exam Triage Vital Signs ED Triage Vitals  Encounter Vitals Group     BP 08/19/23 1213 118/75     Systolic BP Percentile --      Diastolic BP Percentile --      Pulse Rate 08/19/23 1213 100     Resp 08/19/23 1213 18     Temp 08/19/23 1213 98 F (36.7 C)     Temp src --      SpO2 08/19/23 1213 97 %     Weight --      Height --      Head Circumference --      Peak Flow --      Pain Score 08/19/23 1210 0     Pain Loc --      Pain Education --      Exclude from Growth Chart --     No data found.  Updated Vital Signs BP 118/75  Pulse 100   Temp 98 F (36.7 C)   Resp 18   SpO2 97%   Visual Acuity Right Eye Distance:   Left Eye Distance:   Bilateral Distance:    Right Eye Near:   Left Eye Near:    Bilateral Near:     Physical Exam Constitutional:      Appearance: Normal appearance. He is well-developed.  HENT:     Head: Normocephalic and atraumatic.     Nose: Congestion and rhinorrhea present.  Eyes:     Extraocular Movements: Extraocular movements intact.     Conjunctiva/sclera: Conjunctivae normal.     Pupils: Pupils are equal, round, and reactive to light.  Neck:     Thyroid: No thyromegaly.     Trachea: No tracheal deviation.  Cardiovascular:     Rate and Rhythm: Regular rhythm. Tachycardia present.     Heart sounds: Normal heart sounds.  Pulmonary:     Effort: Pulmonary effort is normal.     Breath sounds: Normal breath sounds.  Musculoskeletal:     Cervical back: Normal range of motion and neck supple.  Skin:    General: Skin is warm.  Neurological:     General: No focal deficit present.     Mental Status: He is alert and oriented to person, place, and time.  Psychiatric:        Behavior: Behavior normal.        Thought Content: Thought content normal.        Judgment: Judgment normal.      UC Treatments / Results  Labs (all labs ordered are listed, but only abnormal results are displayed) Labs Reviewed - No data to display  EKG   Radiology No results found.  Procedures Procedures (including critical care time)  Medications Ordered in UC Medications - No data to display  Initial Impression / Assessment and Plan / UC Course  I have reviewed the triage vital signs and the nursing notes.  Pertinent labs & imaging results that were available during my care of the patient were reviewed by me and considered in my medical decision making (see chart for details).    Patient here with viral upper respiratory symptoms  after testing positive for COVID-19 with a home COVID test.  On chart review patient has normal GFR of 60 given symptoms have been present for less than 5 days patient qualifies for Paxlovid.  Patient is also prescribed sildenafil discussed with patient holding sildenafil well taking Paxlovid.  Patient verbalized understanding and agreement with plan.  For cough benzonatate pearls 200 mg times daily as needed for cough. Final Clinical Impressions(s) / UC Diagnoses   Final diagnoses:  COVID-19 virus infection     Discharge Instructions      Hold Sildenafil while taking Paxlovid as there is a drug-drug interaction.  May resume taking M-Sildenafil 24 hours after last dose of Paxlovid.     ED Prescriptions     Medication Sig Dispense Auth. Provider   nirmatrelvir/ritonavir, renal dosing, (PAXLOVID, 150/100,) 10 x 150 MG & 10 x 100MG  TBPK Take 2 tablets by mouth 2 (two) times daily for 5 days. Dosage for moderate renal impairment (eGFR >/= 30 to <60 mL/min): 150 mg nirmatrelvir (one 150 mg tablet) with 100 mg ritonavir (one 100 mg tablet), with both tablets taken together twice daily for 5 days. Not recommended if eGFR < 30 mL/min. PAXLOVID is not recommend in patients with severe hepatic impairment (Child-Pugh Class C). 20 tablet Bing Neighbors,  NP   benzonatate (TESSALON) 100 MG capsule Take 2 capsules (200 mg total) by mouth 3 (three) times daily as needed for cough. 40 capsule Bing Neighbors, NP      PDMP not reviewed this encounter.   Bing Neighbors, NP 08/19/23 1249

## 2023-08-19 NOTE — Discharge Instructions (Addendum)
Hold Sildenafil while taking Paxlovid as there is a drug-drug interaction.  May resume taking M-Sildenafil 24 hours after last dose of Paxlovid.

## 2023-08-19 NOTE — ED Triage Notes (Signed)
Patient to Urgent Care with complaints of a positive home Covid test at home yesterday.  Has been experiencing nasal congestion and cough. Possible fevers yesterday. Symptoms started on Saturday.

## 2023-08-26 ENCOUNTER — Encounter: Payer: Self-pay | Admitting: Family Medicine

## 2023-08-27 ENCOUNTER — Other Ambulatory Visit: Payer: Self-pay

## 2023-08-27 MED ORDER — ATORVASTATIN CALCIUM 20 MG PO TABS: 20.0000 mg | ORAL_TABLET | Freq: Every day | ORAL | 2 refills | Status: DC

## 2023-08-27 NOTE — Telephone Encounter (Signed)
Requested Prescriptions   Signed Prescriptions Disp Refills   atorvastatin (LIPITOR) 20 MG tablet 90 tablet 2    Sig: Take 1 tablet (20 mg total) by mouth daily.    Authorizing Provider: Debbe Odea    Ordering User: Guerry Minors   Last visit 06/18/23 with plan to f/u in 6 months.    Next visit: 12/19/23

## 2023-09-30 ENCOUNTER — Encounter: Payer: Self-pay | Admitting: Dermatology

## 2023-09-30 ENCOUNTER — Ambulatory Visit: Payer: PPO | Admitting: Dermatology

## 2023-09-30 DIAGNOSIS — D492 Neoplasm of unspecified behavior of bone, soft tissue, and skin: Secondary | ICD-10-CM

## 2023-09-30 DIAGNOSIS — Z1283 Encounter for screening for malignant neoplasm of skin: Secondary | ICD-10-CM | POA: Diagnosis not present

## 2023-09-30 DIAGNOSIS — D229 Melanocytic nevi, unspecified: Secondary | ICD-10-CM

## 2023-09-30 DIAGNOSIS — L578 Other skin changes due to chronic exposure to nonionizing radiation: Secondary | ICD-10-CM | POA: Diagnosis not present

## 2023-09-30 DIAGNOSIS — L814 Other melanin hyperpigmentation: Secondary | ICD-10-CM

## 2023-09-30 DIAGNOSIS — W908XXA Exposure to other nonionizing radiation, initial encounter: Secondary | ICD-10-CM

## 2023-09-30 DIAGNOSIS — Z872 Personal history of diseases of the skin and subcutaneous tissue: Secondary | ICD-10-CM

## 2023-09-30 DIAGNOSIS — D225 Melanocytic nevi of trunk: Secondary | ICD-10-CM

## 2023-09-30 DIAGNOSIS — L570 Actinic keratosis: Secondary | ICD-10-CM | POA: Diagnosis not present

## 2023-09-30 DIAGNOSIS — D239 Other benign neoplasm of skin, unspecified: Secondary | ICD-10-CM

## 2023-09-30 DIAGNOSIS — D1801 Hemangioma of skin and subcutaneous tissue: Secondary | ICD-10-CM | POA: Diagnosis not present

## 2023-09-30 DIAGNOSIS — L821 Other seborrheic keratosis: Secondary | ICD-10-CM | POA: Diagnosis not present

## 2023-09-30 HISTORY — DX: Other benign neoplasm of skin, unspecified: D23.9

## 2023-09-30 NOTE — Progress Notes (Signed)
 Follow-Up Visit   Subjective  Shane Stewart is a 78 y.o. male who presents for the following: Skin Cancer Screening and Full Body Skin Exam, hx of precancers.   The patient presents for Total-Body Skin Exam (TBSE) for skin cancer screening and mole check. The patient has spots, moles and lesions to be evaluated, some may be new or changing and the patient may have concern these could be cancer.    The following portions of the chart were reviewed this encounter and updated as appropriate: medications, allergies, medical history  Review of Systems:  No other skin or systemic complaints except as noted in HPI or Assessment and Plan.  Objective  Well appearing patient in no apparent distress; mood and affect are within normal limits.  A full examination was performed including scalp, head, eyes, ears, nose, lips, neck, chest, axillae, abdomen, back, buttocks, bilateral upper extremities, bilateral lower extremities, hands, feet, fingers, toes, fingernails, and toenails. All findings within normal limits unless otherwise noted below.   Relevant physical exam findings are noted in the Assessment and Plan.  scalp x 15 (15) Erythematous thin papules/macules with gritty scale.  left mid back paraspinal 0.6 cm medium brown macule            Assessment & Plan   SKIN CANCER SCREENING PERFORMED TODAY.  ACTINIC DAMAGE - Chronic condition, secondary to cumulative UV/sun exposure - diffuse scaly erythematous macules with underlying dyspigmentation - Recommend daily broad spectrum sunscreen SPF 30+ to sun-exposed areas, reapply every 2 hours as needed.  - Staying in the shade or wearing long sleeves, sun glasses (UVA+UVB protection) and wide brim hats (4-inch brim around the entire circumference of the hat) are also recommended for sun protection.  - Call for new or changing lesions.  LENTIGINES, SEBORRHEIC KERATOSES, HEMANGIOMAS - Benign normal skin lesions - Benign-appearing -  Call for any changes  MELANOCYTIC NEVI - Tan-brown and/or pink-flesh-colored symmetric macules and papules - Benign appearing on exam today - Observation - Call clinic for new or changing moles - Recommend daily use of broad spectrum spf 30+ sunscreen to sun-exposed areas.   AK (ACTINIC KERATOSIS) (15) scalp x 15 (15) Destruction of lesion - scalp x 15 (15) Complexity: simple   Destruction method: cryotherapy   Informed consent: discussed and consent obtained   Timeout:  patient name, date of birth, surgical site, and procedure verified Lesion destroyed using liquid nitrogen: Yes   Region frozen until ice ball extended beyond lesion: Yes   Outcome: patient tolerated procedure well with no complications   Post-procedure details: wound care instructions given   NEOPLASM OF SKIN left mid back paraspinal Epidermal / dermal shaving  Lesion diameter (cm):  0.6 Informed consent: discussed and consent obtained   Timeout: patient name, date of birth, surgical site, and procedure verified   Procedure prep:  Patient was prepped and draped in usual sterile fashion Prep type:  Isopropyl alcohol Anesthesia: the lesion was anesthetized in a standard fashion   Anesthetic:  1% lidocaine  w/ epinephrine 1-100,000 buffered w/ 8.4% NaHCO3 Hemostasis achieved with: pressure, aluminum chloride and electrodesiccation   Outcome: patient tolerated procedure well   Post-procedure details: sterile dressing applied and wound care instructions given   Dressing type: bandage and petrolatum   Specimen 1 - Surgical pathology Differential Diagnosis: R/O Dysplastic nevus   Check Margins: No    Return in about 1 year (around 09/29/2024) for TBSE, hx of AKs .  LILLETTE Fay Kirks, CMA, am acting as scribe  for Alm Rhyme, MD .   Documentation: I have reviewed the above documentation for accuracy and completeness, and I agree with the above.  Alm Rhyme, MD

## 2023-09-30 NOTE — Patient Instructions (Addendum)
 Cryotherapy Aftercare  Wash gently with soap and water everyday.   Apply Vaseline and Band-Aid daily until healed.   Wound Care Instructions  Cleanse wound gently with soap and water once a day then pat dry with clean gauze. Apply a thin coat of Petrolatum (petroleum jelly, Vaseline) over the wound (unless you have an allergy to this). We recommend that you use a new, sterile tube of Vaseline. Do not pick or remove scabs. Do not remove the yellow or white healing tissue from the base of the wound.  Cover the wound with fresh, clean, nonstick gauze and secure with paper tape. You may use Band-Aids in place of gauze and tape if the wound is small enough, but would recommend trimming much of the tape off as there is often too much. Sometimes Band-Aids can irritate the skin.  You should call the office for your biopsy report after 1 week if you have not already been contacted.  If you experience any problems, such as abnormal amounts of bleeding, swelling, significant bruising, significant pain, or evidence of infection, please call the office immediately.  FOR ADULT SURGERY PATIENTS: If you need something for pain relief you may take 1 extra strength Tylenol  (acetaminophen ) AND 2 Ibuprofen (200mg  each) together every 4 hours as needed for pain. (do not take these if you are allergic to them or if you have a reason you should not take them.) Typically, you may only need pain medication for 1 to 3 days.     Due to recent changes in healthcare laws, you may see results of your pathology and/or laboratory studies on MyChart before the doctors have had a chance to review them. We understand that in some cases there may be results that are confusing or concerning to you. Please understand that not all results are received at the same time and often the doctors may need to interpret multiple results in order to provide you with the best plan of care or course of treatment. Therefore, we ask that you  please give us  2 business days to thoroughly review all your results before contacting the office for clarification. Should we see a critical lab result, you will be contacted sooner.   If You Need Anything After Your Visit  If you have any questions or concerns for your doctor, please call our main line at [phone] and press option 4 to reach your doctor's medical assistant. If no one answers, please leave a voicemail as directed and we will return your call as soon as possible. Messages left after 4 pm will be answered the following business day.   You may also send us  a message via MyChart. We typically respond to MyChart messages within 1-2 business days.  For prescription refills, please ask your pharmacy to contact our office. Our fax number is [phone].  If you have an urgent issue when the clinic is closed that cannot wait until the next business day, you can page your doctor at the number below.    Please note that while we do our best to be available for urgent issues outside of office hours, we are not available 24/7.   If you have an urgent issue and are unable to reach us , you may choose to seek medical care at your doctor's office, retail clinic, urgent care center, or emergency room.  If you have a medical emergency, please immediately call 911 or go to the emergency department.  Pager Numbers  - Dr. Hester: [phone]  -  Dr. Jackquline: [phone]  - Dr. Claudene: [phone]   In the event of inclement weather, please call our main line at [phone] for an update on the status of any delays or closures.  Dermatology Medication Tips: Please keep the boxes that topical medications come in in order to help keep track of the instructions about where and how to use these. Pharmacies typically print the medication instructions only on the boxes and not directly on the medication tubes.   If your medication is too expensive, please contact our office at  [phone] option 4 or send us  a message through MyChart.   We are unable to tell what your co-pay for medications will be in advance as this is different depending on your insurance coverage. However, we may be able to find a substitute medication at lower cost or fill out paperwork to get insurance to cover a needed medication.   If a prior authorization is required to get your medication covered by your insurance company, please allow us  1-2 business days to complete this process.  Drug prices often vary depending on where the prescription is filled and some pharmacies may offer cheaper prices.  The website www.goodrx.com contains coupons for medications through different pharmacies. The prices here do not account for what the cost may be with help from insurance (it may be cheaper with your insurance), but the website can give you the price if you did not use any insurance.  - You can print the associated coupon and take it with your prescription to the pharmacy.  - You may also stop by our office during regular business hours and pick up a GoodRx coupon card.  - If you need your prescription sent electronically to a different pharmacy, notify our office through [facility] or by phone at [phone] option 4.     Si Usted Necesita Algo Despus de Su Visita  Tambin puede enviarnos un mensaje a travs de Clinical cytogeneticist. Por lo general respondemos a los mensajes de MyChart en el transcurso de 1 a 2 das hbiles.  Para renovar recetas, por favor pida a su farmacia que se ponga en contacto con nuestra oficina. [place] de fax es [place] [phone].  Si tiene un asunto urgente cuando la clnica est cerrada y que no puede esperar hasta el siguiente da hbil, puede llamar/localizar a su doctor(a) al nmero que aparece a continuacin.   Por favor, tenga en cuenta que aunque hacemos todo lo posible para estar disponibles para asuntos urgentes fuera del horario de [place], no estamos  disponibles las 24 horas del da, los 7 [address]  [address] de la [place].   Si tiene un problema urgente y no puede comunicarse con nosotros, puede optar por buscar atencin mdica  en el consultorio de su doctor(a), en una clnica privada, en un centro de atencin urgente o en una sala de emergencias.  Si tiene Engineer, drilling, por favor llame inmediatamente al 911 o vaya a la sala de emergencias.  Nmeros de bper  - Dr. Hester: [phone]  - Dra. Jackquline: [phone]  - Dr. Claudene: [phone]   En caso de inclemencias del tiempo, por favor llame a landry capes principal al [phone] para una actualizacin sobre el [place] de cualquier retraso o cierre.  Consejos para la medicacin en dermatologa: Por favor, guarde las cajas en las que vienen los medicamentos de uso tpico para ayudarle a seguir las instrucciones sobre dnde y cmo usarlos. Las farmacias generalmente imprimen las instrucciones del medicamento slo en las  cajas y no directamente en los tubos del medicamento.   Si su medicamento es muy caro, por favor, pngase en contacto con landry rieger llamando al [phone] y presione la opcin 4 o envenos un mensaje a travs de Clinical cytogeneticist.   No podemos decirle cul ser su copago por los medicamentos por adelantado ya que esto es diferente dependiendo de la cobertura de su seguro. Sin embargo, es posible que podamos encontrar un medicamento sustituto a Audiological scientist un formulario para que el seguro cubra el medicamento que se considera necesario.   Si se requiere una autorizacin previa para que su compaa de seguros malta su medicamento, por favor permtanos de 1 a 2 das hbiles para completar este proceso.  Los precios de los medicamentos varan con frecuencia dependiendo del Environmental consultant de dnde se surte la receta y alguna farmacias pueden ofrecer precios ms baratos.  El sitio web www.goodrx.com tiene cupones para medicamentos de Health and safety inspector. Los precios aqu no  tienen en cuenta lo que podra costar con la ayuda del seguro (puede ser ms barato con su seguro), pero el sitio web puede darle el precio si no utiliz Tourist information centre manager.  - Puede imprimir el cupn correspondiente y llevarlo con su receta a la farmacia.  - Tambin puede pasar por nuestra oficina durante el horario de atencin regular y Education officer, museum una tarjeta de cupones de GoodRx.  - Si necesita que su receta se enve electrnicamente a una farmacia diferente, informe a nuestra oficina a travs de MyChart de [place] o por telfono llamando al [phone] y presione la opcin 4.

## 2023-10-01 LAB — SURGICAL PATHOLOGY

## 2023-10-02 ENCOUNTER — Ambulatory Visit: Payer: PPO | Admitting: Dermatology

## 2023-10-02 ENCOUNTER — Encounter: Payer: Self-pay | Admitting: Dermatology

## 2023-10-02 ENCOUNTER — Telehealth: Payer: Self-pay

## 2023-10-02 NOTE — Telephone Encounter (Signed)
 Advised pt of bx results and scheduled pt with Dr. Roseanne Reno for excision./sh

## 2023-10-02 NOTE — Telephone Encounter (Signed)
-----   Message from Alm Rhyme sent at 10/02/2023  6:41 AM EST ----- FINAL DIAGNOSIS        1. 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

## 2023-10-14 ENCOUNTER — Encounter: Payer: PPO | Admitting: Dermatology

## 2023-10-14 ENCOUNTER — Telehealth: Payer: PPO | Admitting: Family Medicine

## 2023-10-14 DIAGNOSIS — J069 Acute upper respiratory infection, unspecified: Secondary | ICD-10-CM | POA: Diagnosis not present

## 2023-10-14 MED ORDER — BENZONATATE 100 MG PO CAPS: 100.0000 mg | ORAL_CAPSULE | Freq: Three times a day (TID) | ORAL | 0 refills | Status: DC | PRN

## 2023-10-14 MED ORDER — AZITHROMYCIN 250 MG PO TABS: ORAL_TABLET | ORAL | 0 refills | Status: AC

## 2023-10-14 NOTE — Progress Notes (Signed)
Virtual Visit Consent   Shane Stewart, you are scheduled for a virtual visit with a [facility] provider today. Just as with appointments in the office, your consent must be obtained to participate. Your consent will be active for this visit and any virtual visit you may have with one of our providers in the next 365 days. If you have a MyChart account, a copy of this consent can be sent to you electronically.  As this is a virtual visit, video technology does not allow for your provider to perform a traditional examination. This may limit your provider's ability to fully assess your condition. If your provider identifies any concerns that need to be evaluated in person or the need to arrange testing (such as labs, EKG, etc.), we will make arrangements to do so. Although advances in technology are sophisticated, we cannot ensure that it will always work on either your end or our end. If the connection with a video visit is poor, the visit may have to be switched to a telephone visit. With either a video or telephone visit, we are not always able to ensure that we have a secure connection.  By engaging in this virtual visit, you consent to the provision of healthcare and authorize for your insurance to be billed (if applicable) for the services provided during this visit. Depending on your insurance coverage, you may receive a charge related to this service.  I need to obtain your verbal consent now. Are you willing to proceed with your visit today? Shane Stewart has provided verbal consent on 10/14/2023 for a virtual visit (video or telephone). [name], NP  Date: 10/14/2023 9:44 AM  Virtual Visit via Video Note   I, [name], connected with  Shane Stewart  ([id number], [birth year]) on 10/14/23 at  9:45 AM EST by a video-enabled telemedicine application and verified that I am speaking with the correct person using two identifiers.  Location: Patient: Virtual Visit Location Patient:  Home Provider: Virtual Visit Location Provider: Home Office   I discussed the limitations of evaluation and management by telemedicine and the availability of in person appointments. The patient expressed understanding and agreed to proceed.    History of Present Illness: Shane Stewart is a 78 y.o. who identifies as a male who was assigned male at birth, and is being seen today for URI  Onset was 2 days ago- cough(mucus yellow) and congestion, sneezing, headache- eye pressure  Associated symptoms are fevers 100.4 Modifying factors are perles and motrin  Denies chest pain, shortness of breath  Exposure to sick contacts- known - wife is sick- URI  COVID test: neg Vaccines: Flu UTD    Problems:  Patient Active Problem List   Diagnosis Date Noted   Coronary artery disease 05/08/2023   Medicare annual wellness visit, subsequent 03/20/2023   Gastroesophageal reflux disease 09/02/2022   History of prostate cancer 06/11/2022   Erectile dysfunction 12/25/2017   Dysphagia 12/25/2017   History of vitamin D deficiency 12/12/2017   Advance care planning 12/08/2014   DIVERTICULOSIS, COLON 09/08/2007   Rosacea 09/05/2007    Allergies:  Allergies  Allergen Reactions   Omeprazole     Inc in BP.    Medications:  Current Outpatient Medications:    aspirin EC 81 MG tablet, Take 1 tablet (81 mg total) by mouth daily. Swallow whole., Disp: , Rfl:    atorvastatin (LIPITOR) 20 MG tablet, Take 1 tablet (20 mg total) by mouth daily., Disp:  90 tablet, Rfl: 2   benzonatate (TESSALON) 200 MG capsule, Take 1 capsule (200 mg total) by mouth 3 (three) times daily as needed for cough., Disp: 40 capsule, Rfl: 0   Cholecalciferol 50 MCG (2000 UT) TABS, Take 2 tablets (4,000 Units total) by mouth daily., Disp: 30 tablet, Rfl:    folic acid (FOLVITE) 400 MCG tablet, Take 400 mcg by mouth daily., Disp: , Rfl:    ketorolac (ACULAR) 0.5 % ophthalmic solution, Place 1 drop into the right eye 4 (four) times  daily., Disp: , Rfl:    metroNIDAZOLE (METROGEL) 0.75 % gel, Apply 1 Application topically daily., Disp: 45 g, Rfl: 12   moxifloxacin (VIGAMOX) 0.5 % ophthalmic solution, Place 1 drop into the right eye 4 (four) times daily., Disp: , Rfl:    pantoprazole (PROTONIX) 20 MG tablet, TAKE 1 TABLET BY MOUTH EVERY DAY AS NEEDED, Disp: 90 tablet, Rfl: 1   prednisoLONE acetate (PRED FORTE) 1 % ophthalmic suspension, Place 1 drop into the left eye 4 (four) times daily., Disp: , Rfl:    sildenafil (REVATIO) 20 MG tablet, TAKE 3-5 TABLETS BY MOUTH DAILY AS NEEDED, Disp: 50 tablet, Rfl: 12   vitamin B-12 (CYANOCOBALAMIN) 1000 MCG tablet, Take 1,000 mcg by mouth daily., Disp: , Rfl:    vitamin C (ASCORBIC ACID) 500 MG tablet, Take 500 mg by mouth daily., Disp: , Rfl:    zinc gluconate 50 MG tablet, Take 50 mg by mouth daily., Disp: , Rfl:   Observations/Objective: Patient is well-developed, well-nourished in no acute distress.  Resting comfortably  at home.  Head is normocephalic, atraumatic.  No labored breathing.  Speech is clear and coherent with logical content.  Patient is alert and oriented at baseline.    Assessment and Plan:  1. URI with cough and congestion (Primary)  - azithromycin (ZITHROMAX) 250 MG tablet; Take 2 tablets on day 1, then 1 tablet daily on days 2 through 5  Dispense: 6 tablet; Refill: 0 - benzonatate (TESSALON) 100 MG capsule; Take 1 capsule (100 mg total) by mouth 3 (three) times daily as needed.  Dispense: 30 capsule; Refill: 0  -given need for surgery for skin cancer removal -fevers and mucus production, will go ahead and start coverage for infection that is bacterial related   URI recommendations: - Increased rest - Increasing Fluids - Acetaminophen / ibuprofen as needed for fever/pain.  - Salt water gargling, chloraseptic spray and throat lozenges - Mucinex if mucus is present and increasing.  - Saline nasal spray if congestion or if nasal passages feel dry. -  Humidifying the air.   Reviewed side effects, risks and benefits of medication.    Patient acknowledged agreement and understanding of the plan.   Past Medical, Surgical, Social History, Allergies, and Medications have been Reviewed.    Follow Up Instructions: I discussed the assessment and treatment plan with the patient. The patient was provided an opportunity to ask questions and all were answered. The patient agreed with the plan and demonstrated an understanding of the instructions.  A copy of instructions were sent to the patient via MyChart unless otherwise noted below.    The patient was advised to call back or seek an in-person evaluation if the symptoms worsen or if the condition fails to improve as anticipated.    [name], NP

## 2023-10-14 NOTE — Patient Instructions (Addendum)
[name], thank you for joining [name], NP for today's virtual visit.  While this provider is not your primary care provider (PCP), if your PCP is located in our provider database this encounter information will be shared with them immediately following your visit.   A [facility] MyChart account gives you access to today's visit and all your visits, tests, and labs performed at [facility] " click here if you don't have a [facility] MyChart account or go to [email or web address]  Consent: (Patient) Shane Stewart provided verbal consent for this virtual visit at the beginning of the encounter.  Current Medications:  Current Outpatient Medications:    azithromycin (ZITHROMAX) 250 MG tablet, Take 2 tablets on day 1, then 1 tablet daily on days 2 through 5, Disp: 6 tablet, Rfl: 0   benzonatate (TESSALON) 100 MG capsule, Take 1 capsule (100 mg total) by mouth 3 (three) times daily as needed., Disp: 30 capsule, Rfl: 0   aspirin EC 81 MG tablet, Take 1 tablet (81 mg total) by mouth daily. Swallow whole., Disp: , Rfl:    atorvastatin (LIPITOR) 20 MG tablet, Take 1 tablet (20 mg total) by mouth daily., Disp: 90 tablet, Rfl: 2   Cholecalciferol 50 MCG (2000 UT) TABS, Take 2 tablets (4,000 Units total) by mouth daily., Disp: 30 tablet, Rfl:    folic acid (FOLVITE) 400 MCG tablet, Take 400 mcg by mouth daily., Disp: , Rfl:    ketorolac (ACULAR) 0.5 % ophthalmic solution, Place 1 drop into the right eye 4 (four) times daily., Disp: , Rfl:    metroNIDAZOLE (METROGEL) 0.75 % gel, Apply 1 Application topically daily., Disp: 45 g, Rfl: 12   moxifloxacin (VIGAMOX) 0.5 % ophthalmic solution, Place 1 drop into the right eye 4 (four) times daily., Disp: , Rfl:    pantoprazole (PROTONIX) 20 MG tablet, TAKE 1 TABLET BY MOUTH EVERY DAY AS NEEDED, Disp: 90 tablet, Rfl: 1   prednisoLONE acetate (PRED FORTE) 1 % ophthalmic suspension, Place 1 drop into the left eye 4 (four) times daily.,  Disp: , Rfl:    sildenafil (REVATIO) 20 MG tablet, TAKE 3-5 TABLETS BY MOUTH DAILY AS NEEDED, Disp: 50 tablet, Rfl: 12   vitamin B-12 (CYANOCOBALAMIN) 1000 MCG tablet, Take 1,000 mcg by mouth daily., Disp: , Rfl:    vitamin C (ASCORBIC ACID) 500 MG tablet, Take 500 mg by mouth daily., Disp: , Rfl:    zinc gluconate 50 MG tablet, Take 50 mg by mouth daily., Disp: , Rfl:    Medications ordered in this encounter:  Meds ordered this encounter  Medications   azithromycin (ZITHROMAX) 250 MG tablet    Sig: Take 2 tablets on day 1, then 1 tablet daily on days 2 through 5    Dispense:  6 tablet    Refill:  0    Supervising Provider:   Merrilee Jansky [[id number]]   benzonatate (TESSALON) 100 MG capsule    Sig: Take 1 capsule (100 mg total) by mouth 3 (three) times daily as needed.    Dispense:  30 capsule    Refill:  0    Supervising Provider:   Merrilee Jansky [[id number]]     *If you need refills on other medications prior to your next appointment, please contact your pharmacy*  Follow-Up: Call back or seek an in-person evaluation if the symptoms worsen or if the condition fails to improve as anticipated.  [facility] [phone]  Other Instructions  URI recommendations: - Increased rest - Increasing Fluids - Acetaminophen / ibuprofen as needed for fever/pain.  - Salt water gargling, chloraseptic spray and throat lozenges - Mucinex if mucus is present and increasing.  - Saline nasal spray if congestion or if nasal passages feel dry. - Humidifying the air.      If you have been instructed to have an in-person evaluation today at a local Urgent Care facility, please use the link below. It will take you to a list of all of our available [facility], including address, phone number and hours of operation. Please do not delay care.  [facility]  If you or a family member do not have a primary care provider, use the link below to schedule a  visit and establish care. When you choose a [facility] primary care physician or advanced practice provider, you gain a long-term partner in health. Find a Primary Care Provider  Learn more about [facility]'s in-office and virtual care options: [facility] - Get Care Now

## 2023-10-22 ENCOUNTER — Telehealth: Payer: Self-pay

## 2023-10-22 DIAGNOSIS — D239 Other benign neoplasm of skin, unspecified: Secondary | ICD-10-CM

## 2023-10-22 DIAGNOSIS — L570 Actinic keratosis: Secondary | ICD-10-CM

## 2023-10-22 NOTE — Telephone Encounter (Signed)
Spoke with patient this morning about Dr. Gwen Pounds no longer doing excisions in office. Patient prefers referral to Dr. Caralyn Guile for have surgery done sooner.   Referral sent to [facility] in [place]. aw

## 2023-10-31 ENCOUNTER — Encounter: Payer: Self-pay | Admitting: Dermatology

## 2023-11-04 ENCOUNTER — Encounter: Payer: Self-pay | Admitting: Dermatology

## 2023-11-04 ENCOUNTER — Ambulatory Visit: Payer: PPO | Admitting: Dermatology

## 2023-11-04 VITALS — BP 153/83 | HR 68

## 2023-11-04 DIAGNOSIS — L988 Other specified disorders of the skin and subcutaneous tissue: Secondary | ICD-10-CM | POA: Diagnosis not present

## 2023-11-04 DIAGNOSIS — D235 Other benign neoplasm of skin of trunk: Secondary | ICD-10-CM

## 2023-11-04 DIAGNOSIS — D239 Other benign neoplasm of skin, unspecified: Secondary | ICD-10-CM

## 2023-11-04 NOTE — Progress Notes (Signed)
 Follow-Up Visit   Subjective  Shane Stewart is a 78 y.o. male who presents for the following: Excision of a Dysplastic Nevus with moderate to severe atypia on the  left mid back paraspinal region, referred by Dr. Bary Likes.  The following portions of the chart were reviewed this encounter and updated as appropriate: medications, allergies, medical history  Review of Systems:  No other skin or systemic complaints except as noted in HPI or Assessment and Plan.  Objective  Well appearing patient in no apparent distress; mood and affect are within normal limits.  A focused examination was performed of the following areas: Left mid back paraspinal Relevant physical exam findings are noted in the Assessment and Plan.     Assessment & Plan   DYSPLASTIC NEVUS left mid back paraspinal Skin excision - left mid back paraspinal  Excision method:  elliptical Lesion length (cm):  1.6 Lesion width (cm):  0.6 Margin per side (cm):  0.5 Total excision diameter (cm):  2.6 Informed consent: discussed and consent obtained   Timeout: patient name, date of birth, surgical site, and procedure verified   Procedure prep:  Patient was prepped and draped in usual sterile fashion Prep type:  Chlorhexidine Anesthesia: the lesion was anesthetized in a standard fashion   Anesthetic:  1% lidocaine  w/ epinephrine 1-100,000 buffered w/ 8.4% NaHCO3 Instrument used: #15 blade   Hemostasis achieved with: suture, pressure and electrodesiccation   Outcome: patient tolerated procedure well with no complications   Post-procedure details: sterile dressing applied and wound care instructions given   Dressing type: bandage, petrolatum and pressure dressing    Skin repair - left mid back paraspinal Complexity:  Complex Final length (cm):  5 Informed consent: discussed and consent obtained   Timeout: patient name, date of birth, surgical site, and procedure verified   Procedure prep:  Patient was prepped and  draped in usual sterile fashion Prep type:  Chlorhexidine Anesthesia: the lesion was anesthetized in a standard fashion   Anesthetic:  1% lidocaine  w/ epinephrine 1-100,000 buffered w/ 8.4% NaHCO3 Reason for type of repair: reduce tension to allow closure and preserve normal anatomy   Undermining: area extensively undermined   Subcutaneous layers (deep stitches):  Suture size:  3-0 Suture type: PDS (polydioxanone)   Stitches:  Buried vertical mattress Fine/surface layer approximation (top stitches):  Suture type: cyanoacrylate tissue glue   Suture removal (days):  0 Hemostasis achieved with: suture, pressure and electrodesiccation Outcome: patient tolerated procedure well with no complications   Post-procedure details: sterile dressing applied and wound care instructions given   Dressing type: bandage, bacitracin  and pressure dressing   Specimen 1 - Surgical pathology Differential Diagnosis: DN [id number] Check Margins: No  The surgical wound was then cleaned, prepped, and re-anesthetized as above. Wound edges were undermined extensively along at least one entire edge and at a distance equal to or greater than the width of the defect (see wound defect size above) in order to achieve closure and decrease wound tension and anatomic distortion. Redundant tissue repair including standing cone removal was performed. Hemostasis was achieved with electrocautery. Subcutaneous and epidermal tissues were approximated with the above sutures. The surgical site was then lightly scrubbed with sterile, saline-soaked gauze. Steri-strips were applied, and the area was then bandaged using Vaseline ointment, non-adherent gauze, gauze pads, and tape to provide an adequate pressure dressing. The patient tolerated the procedure well, was given detailed written and verbal wound care instructions, and was discharged in good condition.   The  patient will follow-up: PRN.  Return if symptoms worsen or fail to  improve.  Documentation: I have reviewed the above documentation for accuracy and completeness, and I agree with the above.  Deneise Finlay, MD

## 2023-11-04 NOTE — Patient Instructions (Signed)

## 2023-11-07 LAB — SURGICAL PATHOLOGY

## 2023-11-12 ENCOUNTER — Encounter: Payer: PPO | Admitting: Dermatology

## 2023-12-16 NOTE — Progress Notes (Unsigned)
 Cardiology Office Note    Date:  12/19/2023   ID:  Shane Stewart, DOB 1946-01-30, MRN [id number]  PCP:  Joaquim Nam, MD  Cardiologist:  Debbe Odea, MD  Electrophysiologist:  None   Chief Complaint: Follow up  History of Present Illness:   Shane Stewart is a 78 y.o. male with history of CAD with coronary calcification in the LAD, LCx, and RCA on recent calcium score, HLD, prostate cancer, and GERD who presents for follow up of CAD and HLD.  Calcium score in 04/2023 of 984 which was the 77th percentile.  In this setting, he was referred to cardiology for risk stratification.  He established care with Dr. Azucena Cecil on 05/31/2023 and was tolerating recently initiated atorvastatin and aspirin.  He reported he had a sister with an MI in her 70s.  He was without symptoms of angina or cardiac decompensation.  Echo on 06/17/2023 showed an EF of 60 to 65%, no regional wall motion abnormalities, mild LVH, grade 1 diastolic dysfunction, normal RV systolic function and ventricular cavity size, no significant valvular abnormalities, and an estimated right atrial pressure of 3 mmHg.  He was most recently seen in the office in 05/2023 and remained without symptoms of angina or cardiac decompensation.  He was very active at baseline without cardiac limitation.  No changes were indicated in pharmacotherapy at that time.  He comes in doing well from a cardiac perspective and is without symptoms of angina or cardiac decompensation.  He remains active at baseline, performing high intensity training several days per week without cardiac limitation.  No palpitation, dizziness, presyncope, or syncope.  No lower extremity swelling or progressive orthopnea.  No falls or symptoms concerning for bleeding.  Not currently checking blood pressure at home, monitors sodium intake.  Adherent and tolerating aspirin and atorvastatin without off target effect.  Overall feels well and does not have any acute cardiac concerns  at this time.   Labs independently reviewed: 06/2023 - TC 129, TG 75, HDL 53, LDL 61, albumin 4.3, AST/ALT normal 05/2023 - LP(a) less than 8.4 02/2023 - potassium 4.5, BUN 14, serum creatinine 0.98, A1c 5.7 06/2017 - Hgb 15.1, PLT 139  Past Medical History:  Diagnosis Date   Actinic keratoses    Allergy    spring, fall   Cancer [facility])    prostate   Cataract 12/12/2017   right eye forming    Diverticulosis of colon 01/1999   Dysplastic nevus 09/30/2023   left mid back paraspinal, mod to severe, scheduled for excision   GERD (gastroesophageal reflux disease)    History of CT scan 04/1985   L/S, H/P L5/S1   Hyperlipidemia    Medicare annual wellness visit, subsequent 03/20/2023   Prostate cancer (HCC)    Right knee pain    better with hyaluronic acid   Rosacea     Past Surgical History:  Procedure Laterality Date   APPENDECTOMY     CATARACT EXTRACTION     COLONOSCOPY     CYSTOSCOPY N/A 07/16/2017   Procedure: CYSTOSCOPY;  Surgeon: Vanna Scotland, MD;  Location: [facility] ORS;  Service: Urology;  Laterality: N/A;   LUMBAR SPINE SURGERY  1986   LS ruptured disc repair   POLYPECTOMY     RADIOACTIVE SEED IMPLANT N/A 07/16/2017   Procedure: RADIOACTIVE SEED IMPLANT/BRACHYTHERAPY IMPLANT;  Surgeon: Vanna Scotland, MD;  Location: [facility] ORS;  Service: Urology;  Laterality: N/A;   RADIOACTIVE SEED IMPLANT     Prostate-  [facility]  skin excision  2020   multiple    TONSILLECTOMY     UMBILICAL HERNIA REPAIR  10/05/2005    Current Medications: Current Meds  Medication Sig   aspirin EC 81 MG tablet Take 1 tablet (81 mg total) by mouth daily. Swallow whole.   atorvastatin (LIPITOR) 20 MG tablet Take 1 tablet (20 mg total) by mouth daily.   Cholecalciferol 50 MCG (2000 UT) TABS Take 2 tablets (4,000 Units total) by mouth daily.   folic acid (FOLVITE) 400 MCG tablet Take 400 mcg by mouth daily.   metroNIDAZOLE (METROGEL) 0.75 % gel Apply 1 Application topically daily.    pantoprazole (PROTONIX) 20 MG tablet TAKE 1 TABLET BY MOUTH EVERY DAY AS NEEDED   sildenafil (REVATIO) 20 MG tablet TAKE 3-5 TABLETS BY MOUTH DAILY AS NEEDED   vitamin B-12 (CYANOCOBALAMIN) 1000 MCG tablet Take 1,000 mcg by mouth daily.   vitamin C (ASCORBIC ACID) 500 MG tablet Take 500 mg by mouth daily.   zinc gluconate 50 MG tablet Take 50 mg by mouth daily.    Allergies:   Omeprazole   Social History   Socioeconomic History   Marital status: Married    Spouse name: Not on file   Number of children: 0   Years of education: Not on file   Highest education level: Not on file  Occupational History   Occupation: MGR//Contractor Self Employed    Employer: retired  Tobacco Use   Smoking status: Never   Smokeless tobacco: Never  Vaping Use   Vaping status: Never Used  Substance and Sexual Activity   Alcohol use: No   Drug use: Never   Sexual activity: Yes  Other Topics Concern   Not on file  Social History Narrative   Remarried 2022.  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    Social Drivers of Corporate investment banker Strain: Not on file  Food Insecurity: Not on file  Transportation Needs: Not on file  Physical Activity: Not on file  Stress: Not on file  Social Connections: Not on file     Family History:  The patient's family history includes Alzheimer's disease in his mother; Cancer in his father, sister, and sister; Cancer - Colon (age of onset: 61) in his father; Colon cancer in his father; Dementia in his mother; Diabetes in his father and sister; Heart disease in his father and sister; Hypertension in his sister. There is no history of Prostate cancer, Esophageal cancer, Rectal cancer, or Stomach cancer.  ROS:    12-point review of systems is negative unless otherwise noted in the HPI.   EKGs/Labs/Other Studies Reviewed:    Studies reviewed were summarized above. The additional studies were reviewed today:  2D echo 06/17/2023: 1. Left ventricular ejection fraction, by estimation, is 60 to 65%. The  left ventricle has normal function. The left ventricle has no regional  wall motion abnormalities. There is mild left ventricular hypertrophy.  Left ventricular diastolic parameters  are consistent with Grade I diastolic dysfunction (impaired relaxation).  The average left ventricular global longitudinal strain is -19.6 %. The  global longitudinal strain is normal.   2. Right ventricular systolic function is normal. The right ventricular  size is normal.  3. The mitral valve is normal in structure. No evidence of mitral valve  regurgitation. No evidence of mitral stenosis.   4. The aortic valve is tricuspid. Aortic valve regurgitation is not  visualized. No aortic stenosis is present.   5. The inferior vena cava is normal in size with greater than 50%  respiratory variability, suggesting right atrial pressure of 3 mmHg.  __________   Calcium score 05/02/2023: Ascending Aorta: Normal size   Pericardium: Normal   Coronary arteries: Normal origin of left and right coronary arteries. Distribution of arterial calcifications if present, as noted below;   LM 0   LAD 228   LCx 733   RCA 23.5   Total 984   IMPRESSION AND RECOMMENDATION: 1. Coronary calcium score of 984. This was 77th percentile for age and sex matched control. 2. CAC >300 in LAD, LCx, RCA. CAC-DRS A3/N3. 3. Recommend aspirin and statin if no contraindication. 4. Recommend cardiology consultation. 5. Continue heart healthy lifestyle and risk factor modification.   EKG:  EKG is not ordered today.    Recent Labs: 03/11/2023: BUN 14; Creatinine, Ser 0.98; Potassium 4.5; Sodium 138 07/09/2023: ALT 27  Recent Lipid Panel     Component Value Date/Time   CHOL 129 07/09/2023 0732   TRIG 75.0 07/09/2023 0732   HDL 53.40 07/09/2023 0732   CHOLHDL 2 07/09/2023 0732   VLDL 15.0 07/09/2023 0732   LDLCALC 61 07/09/2023 0732   LDLDIRECT 158.3 09/13/2008 0910    PHYSICAL EXAM:    VS:  BP 134/62   Pulse 70   Ht 5\' 8"  (1.727 m)   Wt 179 lb (81.2 kg)   SpO2 98%   BMI 27.22 kg/m   BMI: Body mass index is 27.22 kg/m.  Physical Exam Vitals reviewed.  Constitutional:      Appearance: He is well-developed.  HENT:     Head: Normocephalic and atraumatic.  Eyes:     General:        Right eye: No discharge.        Left eye: No discharge.  Cardiovascular:     Rate and Rhythm: Normal rate and regular rhythm.     Heart sounds: Normal heart sounds, S1 normal and S2 normal. Heart sounds not distant. No midsystolic click and no opening snap. No murmur heard.    No friction rub.  Pulmonary:     Effort: Pulmonary effort is normal. No respiratory distress.     Breath sounds: Normal breath sounds. No decreased breath sounds, wheezing, rhonchi or rales.  Chest:     Chest wall: No tenderness.  Musculoskeletal:     Cervical back: Normal range of motion.     Right lower leg: No edema.     Left lower leg: No edema.  Skin:    General: Skin is warm and dry.     Nails: There is no clubbing.  Neurological:     Mental Status: He is alert and oriented to person, place, and time.  Psychiatric:        Speech: Speech normal.        Behavior: Behavior normal.        Thought Content: Thought content normal.        Judgment: Judgment normal.     Wt Readings from Last 3 Encounters:  12/19/23 179 lb (81.2 kg)  06/18/23 177 lb 6.4 oz (80.5 kg)  05/31/23 175 lb 9.6 oz (79.7 kg)     ASSESSMENT & PLAN:   CAD involving the  native coronary arteries without angina: He continues to do very well and is without symptoms of angina or cardiac decompensation.  He is very active at baseline without interval training several days per  week without cardiac limitation.  Continue aggressive risk factor modification and primary prevention including aspirin 81 mg statin 20 mg.  No indication for further ischemic testing at this time.  HLD: LDL 61 in 06/2023 with normal LFT at that time.  He remains on atorvastatin 20 mg.      Disposition: F/u with Dr. Azucena Cecil or an APP in 6 months.   Medication Adjustments/Labs and Tests Ordered: Current medicines are reviewed at length with the patient today.  Concerns regarding medicines are outlined above. Medication changes, Labs and Tests ordered today are summarized above and listed in the Patient Instructions accessible in Encounters.   Signed, Eula Listen, PA-C 12/19/2023 12:19 PM     [facility] [place] [address] [place], [place] [address] [phone]

## 2023-12-19 ENCOUNTER — Ambulatory Visit: Payer: PPO | Attending: Physician Assistant | Admitting: Physician Assistant

## 2023-12-19 ENCOUNTER — Encounter: Payer: Self-pay | Admitting: Physician Assistant

## 2023-12-19 VITALS — BP 134/62 | HR 70 | Ht 68.0 in | Wt 179.0 lb

## 2023-12-19 DIAGNOSIS — I2510 Atherosclerotic heart disease of native coronary artery without angina pectoris: Secondary | ICD-10-CM

## 2023-12-19 DIAGNOSIS — E785 Hyperlipidemia, unspecified: Secondary | ICD-10-CM | POA: Diagnosis not present

## 2023-12-19 NOTE — Patient Instructions (Signed)
 Medication Instructions:  Your Physician recommend you continue on your current medication as directed.    *If you need a refill on your cardiac medications before your next appointment, please call your pharmacy*   Lab Work: None ordered at this time    Follow-Up: At [facility], you and your health needs are our priority.  As part of our continuing mission to provide you with exceptional heart care, we have created designated Provider Care Teams.  These Care Teams include your primary Cardiologist (physician) and Advanced Practice Providers (APPs -  Physician Assistants and Nurse Practitioners) who all work together to provide you with the care you need, when you need it.   Your next appointment:   6 month(s)  Provider:   You may see Debbe Odea, MD or Eula Listen, PA-C

## 2024-01-01 ENCOUNTER — Other Ambulatory Visit: Payer: Self-pay | Admitting: Family Medicine

## 2024-01-06 ENCOUNTER — Encounter: Payer: PPO | Admitting: Dermatology

## 2024-01-14 ENCOUNTER — Ambulatory Visit (INDEPENDENT_AMBULATORY_CARE_PROVIDER_SITE_OTHER)

## 2024-01-14 VITALS — BP 158/76 | Ht 68.0 in | Wt 175.0 lb

## 2024-01-14 DIAGNOSIS — Z0000 Encounter for general adult medical examination without abnormal findings: Secondary | ICD-10-CM

## 2024-01-14 NOTE — Progress Notes (Signed)
 Because this visit was a virtual/telehealth visit,  certain criteria was not obtained, such a blood pressure, CBG if applicable, and timed get up and go. Any medications not marked as "taking" were not mentioned during the medication reconciliation part of the visit. Any vitals not documented were not able to be obtained due to this being a telehealth visit or patient was unable to self-report a recent blood pressure reading due to a lack of equipment at home via telehealth. Vitals that have been documented are verbally provided by the patient.  Subjective:   Shane Stewart is a 79 y.o. who presents for a Medicare Wellness preventive visit.  Visit Complete: Virtual I connected with  Shane Stewart on 01/14/24 by a video and audio enabled telemedicine application and verified that I am speaking with the correct person using two identifiers.  Patient Location: Home  Provider Location: Home Office  I discussed the limitations of evaluation and management by telemedicine. The patient expressed understanding and agreed to proceed.  Vital Signs: Because this visit was a virtual/telehealth visit, some criteria may be missing or patient reported. Any vitals not documented were not able to be obtained and vitals that have been documented are patient reported.  Persons Participating in Visit: Patient.  AWV Questionnaire: Yes: Patient Medicare AWV questionnaire was completed by the patient on 01/10/2024; I have confirmed that all information answered by patient is correct and no changes since this date.  Cardiac Risk Factors include: advanced age (>57men, >13 women);male gender     Objective:    Today's Vitals   01/14/24 1310  BP: (!) 158/76  Weight: 175 lb (79.4 kg)  Height: 5\' 8"  (1.727 m)   Body mass index is 26.61 kg/m.     01/14/2024    1:15 PM 08/19/2023   12:09 PM 03/24/2020    1:23 PM 08/25/2019   12:54 PM 06/18/2019    9:27 AM 02/25/2019    2:08 PM 06/19/2018    9:29 AM  Advanced  Directives  Does Patient Have a Medical Advance Directive? Yes No Yes Yes No Yes No  Type of Estate agent of [place];Living will  Healthcare Power of [place];Living will Healthcare Power of [place];Living will  Healthcare Power of [place];Living will   Does patient want to make changes to medical advance directive? No - Patient declined        Copy of Healthcare Power of Attorney in Chart? No - copy requested  No - copy requested   No - copy requested   Would patient like information on creating a medical advance directive?     No - Patient declined  No - Patient declined    Current Medications (verified) Outpatient Encounter Medications as of 01/14/2024  Medication Sig   aspirin  EC 81 MG tablet Take 1 tablet (81 mg total) by mouth daily. Swallow whole.   atorvastatin  (LIPITOR) 20 MG tablet Take 1 tablet (20 mg total) by mouth daily.   Cholecalciferol  50 MCG (2000 UT) TABS Take 2 tablets (4,000 Units total) by mouth daily.   folic acid (FOLVITE) 400 MCG tablet Take 400 mcg by mouth daily.   metroNIDAZOLE  (METROGEL ) 0.75 % gel Apply 1 Application topically daily.   pantoprazole  (PROTONIX ) 20 MG tablet TAKE 1 TABLET BY MOUTH EVERY DAY AS NEEDED   sildenafil  (REVATIO ) 20 MG tablet TAKE 3-5 TABLETS BY MOUTH DAILY AS NEEDED   vitamin B-12 (CYANOCOBALAMIN) 1000 MCG tablet Take 1,000 mcg by mouth daily.   vitamin C (ASCORBIC  ACID) 500 MG tablet Take 500 mg by mouth daily.   zinc  gluconate 50 MG tablet Take 50 mg by mouth daily.   No facility-administered encounter medications on file as of 01/14/2024.    Allergies (verified) Omeprazole    History: Past Medical History:  Diagnosis Date   Actinic keratoses    Allergy    spring, fall   Cancer [facility])    prostate   Cataract 12/12/2017   right eye forming    Diverticulosis of colon 01/1999   Dysplastic nevus 09/30/2023   left mid back paraspinal, mod to severe, scheduled for excision   GERD (gastroesophageal reflux  disease)    History of CT scan 04/1985   L/S, H/P L5/S1   Hyperlipidemia    Medicare annual wellness visit, subsequent 03/20/2023   Prostate cancer (HCC)    Right knee pain    better with hyaluronic acid   Rosacea    Past Surgical History:  Procedure Laterality Date   APPENDECTOMY     CATARACT EXTRACTION     COLONOSCOPY     CYSTOSCOPY N/A 07/16/2017   Procedure: CYSTOSCOPY;  Surgeon: Dustin Gimenez, MD;  Location: [facility] ORS;  Service: Urology;  Laterality: N/A;   LUMBAR SPINE SURGERY  1986   LS ruptured disc repair   POLYPECTOMY     RADIOACTIVE SEED IMPLANT N/A 07/16/2017   Procedure: RADIOACTIVE SEED IMPLANT/BRACHYTHERAPY IMPLANT;  Surgeon: Dustin Gimenez, MD;  Location: [facility] ORS;  Service: Urology;  Laterality: N/A;   RADIOACTIVE SEED IMPLANT     Prostate- [facility]   skin excision  2020   multiple    TONSILLECTOMY     UMBILICAL HERNIA REPAIR  10/05/2005   Family History  Problem Relation Age of Onset   Alzheimer's disease Mother    Dementia Mother    Heart disease Father        CAD   Diabetes Father    Cancer - Colon Father 62       colon   Colon cancer Father    Cancer Father    Diabetes Sister    Heart disease Sister        MI CABG x 3   Hypertension Sister    Cancer Sister    Cancer Sister    Prostate cancer Neg Hx    Esophageal cancer Neg Hx    Rectal cancer Neg Hx    Stomach cancer Neg Hx    Social History   Socioeconomic History   Marital status: Married    Spouse name: Not on file   Number of children: 0   Years of education: Not on file   Highest education level: Bachelor's degree (e.g., BA, AB, BS)  Occupational History   Occupation: MGR//Contractor Self Employed    Employer: retired  Tobacco Use   Smoking status: Never   Smokeless tobacco: Never  Vaping Use   Vaping status: Never Used  Substance and Sexual Activity   Alcohol use: No   Drug use: Never   Sexual activity: Yes  Other Topics Concern   Not on file  Social  History Narrative   Remarried 2022.  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    Social Drivers of Dispensing optician  Resource Strain: Low Risk  (01/10/2024)   Overall Financial Resource Strain (CARDIA)    Difficulty of Paying Living Expenses: Not hard at all  Food Insecurity: No Food Insecurity (01/10/2024)   Hunger Vital Sign    Worried About Running Out of Food in the Last Year: Never true    Ran Out of Food in the Last Year: Never true  Transportation Needs: No Transportation Needs (01/10/2024)   PRAPARE - Administrator, Civil Service (Medical): No    Lack of Transportation (Non-Medical): No  Physical Activity: Insufficiently Active (01/10/2024)   Exercise Vital Sign    Days of Exercise per Week: 3 days    Minutes of Exercise per Session: 40 min  Stress: No Stress Concern Present (01/10/2024)   [employer] of Occupational Health - Occupational Stress Questionnaire    Feeling of Stress : Not at all  Social Connections: Socially Integrated (01/10/2024)   Social Connection and Isolation Panel [NHANES]    Frequency of Communication with Friends and Family: Twice a week    Frequency of Social Gatherings with Friends and Family: Once a week    Attends Religious Services: More than 4 times per year    Active Member of [employer] or Organizations: Yes    Attends Engineer, structural: More than 4 times per year    Marital Status: Married    Tobacco Counseling Counseling given: Not Answered    Clinical Intake:  Pre-visit preparation completed: Yes  Pain : No/denies pain     BMI - recorded: 26.61 Nutritional Status: BMI 25 -29 Overweight Nutritional Risks: None Diabetes: No  Lab Results  Component Value Date    HGBA1C 5.7 03/11/2023   HGBA1C 5.7 03/07/2022   HGBA1C 5.8 03/06/2021     How often do you need to have someone help you when you read instructions, pamphlets, or other written materials from your doctor or pharmacy?: 1 - Never What is the last grade level you completed in school?: Degree     Information entered by :: [employer]   Activities of Daily Living     01/14/2024    1:14 PM  In your present state of health, do you have any difficulty performing the following activities:  Hearing? 0  Vision? 0  Difficulty concentrating or making decisions? 0  Walking or climbing stairs? 0  Dressing or bathing? 0  Doing errands, shopping? 0  Preparing Food and eating ? N  Using the Toilet? N  In the past six months, have you accidently leaked urine? N  Do you have problems with loss of bowel control? N  Managing your Medications? N  Managing your Finances? N  Housekeeping or managing your Housekeeping? N    Patient Care Team: Donnie Galea, MD as PCP - General (Family Medicine) Constancia Delton, MD as PCP - Cardiology (Cardiology) Annell Kidney, MD as Referring Physician (Ophthalmology) Geraline Knapp, MD (Urology)  Indicate any recent Medical Services you may have received from other than [facility] providers in the past year (date may be approximate).     Assessment:   This is a routine wellness examination for [place].  Hearing/Vision screen Hearing Screening - Comments:: No difficulties hearing Vision Screening - Comments:: Wears glasses   Goals Addressed             This Visit's Progress    Patient Stated       Patient would like to be the guy god what's him to be  Depression Screen     01/14/2024    1:18 PM 03/18/2023   10:56 AM 12/11/2022    3:57 PM 03/13/2022    2:28 PM 03/24/2020    1:24 PM 02/25/2019    2:07 PM 12/12/2017   10:48 AM  PHQ 2/9 Scores  PHQ - 2 Score 0 0 0 0 0 0 0  PHQ- 9 Score 0 0 0  0 0 0    Fall Risk      01/14/2024    1:15 PM 03/18/2023   10:56 AM 12/11/2022    3:57 PM 03/13/2022    2:28 PM 03/13/2021    8:58 AM  Fall Risk   Falls in the past year? 0 0 0 0 0  Number falls in past yr: 0 0 0  0  Injury with Fall? 0 0 0  0  Risk for fall due to : No Fall Risks No Fall Risks No Fall Risks  No Fall Risks  Follow up Falls prevention discussed;Falls evaluation completed Falls evaluation completed Falls evaluation completed  Falls evaluation completed    MEDICARE RISK AT HOME:  Medicare Risk at Home Any stairs in or around the home?: Yes If so, are there any without handrails?: No Home free of loose throw rugs in walkways, pet beds, electrical cords, etc?: Yes Adequate lighting in your home to reduce risk of falls?: Yes Life alert?: No Use of a cane, walker or w/c?: No Grab bars in the bathroom?: No Shower chair or bench in shower?: Yes Elevated toilet seat or a handicapped toilet?: No  TIMED UP AND GO:  Was the test performed?  No  Cognitive Function: 6CIT completed    03/24/2020    1:27 PM 02/25/2019    2:07 PM 12/12/2017   11:00 AM 12/05/2016    8:11 AM  MMSE - Mini Mental State Exam  Not completed: Refused     Orientation to time  5 5 5   Orientation to Place  5 5 5   Registration  3 3 3   Attention/ Calculation  0 0 0  Recall  3 1 3   Recall-comments   unable to recall 2 of 3 words   Language- name 2 objects  0 0 0  Language- repeat  1 1 1   Language- follow 3 step command  0 3 3  Language- read & follow direction  0 0 0  Write a sentence  0 0 0  Copy design  0 0 0  Total score  17 18 20         01/14/2024    1:12 PM  6CIT Screen  What Year? 0 points  What month? 0 points  What time? 0 points  Count back from 20 0 points  Months in reverse 0 points  Repeat phrase 0 points  Total Score 0 points    Immunizations Immunization History  Administered Date(s) Administered   H1N1 10/25/2008   Hepatitis A 09/22/2008, 03/24/2009   Hepatitis B 09/22/2008, 10/25/2008,  03/24/2009   Influenza Whole 07/08/2002, 07/07/2008, 07/19/2010   Influenza, High Dose Seasonal PF 06/28/2017, 06/26/2019, 07/31/2022, 08/07/2023   Influenza,inj,Quad PF,6+ Mos 07/09/2013, 06/28/2017   Influenza-Unspecified 07/06/2014, 06/28/2015, 06/24/2016, 06/28/2017, 07/14/2021   PFIZER(Purple Top)SARS-COV-2 Vaccination 11/06/2019, 12/02/2019, 09/15/2020   PNEUMOCOCCAL CONJUGATE-20 03/18/2021   Pneumococcal Conjugate-13 10/21/2012, 12/07/2014   Pneumococcal Polysaccharide-23 12/08/2015   Td 02/15/2003, 10/25/2009   Tdap 11/28/2009, 05/30/2020   Zoster Recombinant(Shingrix) 08/24/2019, 09/29/2019   Zoster, Live 03/24/2013    Screening Tests Health Maintenance  Topic Date Due   INFLUENZA VACCINE  04/24/2024   Colonoscopy  08/24/2024   Medicare Annual Wellness (AWV)  01/13/2025   DTaP/Tdap/Td (5 - Td or Tdap) 05/30/2030   Pneumonia Vaccine 6+ Years old  Completed   Hepatitis C Screening  Completed   Zoster Vaccines- Shingrix  Completed   HPV VACCINES  Aged Out   Meningococcal B Vaccine  Aged Out   COVID-19 Vaccine  Discontinued    Health Maintenance  There are no preventive care reminders to display for this patient. Health Maintenance Items Addressed: health maintenance up to date   Additional Screening:  Vision Screening: Recommended annual ophthalmology exams for early detection of glaucoma and other disorders of the eye.  Dental Screening: Recommended annual dental exams for proper oral hygiene  Community Resource Referral / Chronic Care Management: CRR required this visit?  No   CCM required this visit?  No     Plan:     I have personally reviewed and noted the following in the patient's chart:   Medical and social history Use of alcohol, tobacco or illicit drugs  Current medications and supplements including opioid prescriptions. Patient is not currently taking opioid prescriptions. Functional ability and status Nutritional status Physical  activity Advanced directives List of other physicians Hospitalizations, surgeries, and ER visits in previous 12 months Vitals Screenings to include cognitive, depression, and falls Referrals and appointments  In addition, I have reviewed and discussed with patient certain preventive protocols, quality metrics, and best practice recommendations. A written personalized care plan for preventive services as well as general preventive health recommendations were provided to patient.     Freeda Jerry, [place]   01/14/2024   After Visit Summary: (MyChart) Due to this being a telephonic visit, the after visit summary with patients personalized plan was offered to patient via MyChart   Notes: Nothing significant to report at this time.

## 2024-03-11 ENCOUNTER — Other Ambulatory Visit: Payer: Self-pay | Admitting: Family Medicine

## 2024-03-11 DIAGNOSIS — Z8639 Personal history of other endocrine, nutritional and metabolic disease: Secondary | ICD-10-CM

## 2024-03-11 DIAGNOSIS — Z125 Encounter for screening for malignant neoplasm of prostate: Secondary | ICD-10-CM

## 2024-03-11 DIAGNOSIS — E785 Hyperlipidemia, unspecified: Secondary | ICD-10-CM

## 2024-03-23 ENCOUNTER — Ambulatory Visit: Payer: Self-pay | Admitting: Family Medicine

## 2024-03-23 ENCOUNTER — Other Ambulatory Visit (INDEPENDENT_AMBULATORY_CARE_PROVIDER_SITE_OTHER): Payer: PPO

## 2024-03-23 DIAGNOSIS — Z125 Encounter for screening for malignant neoplasm of prostate: Secondary | ICD-10-CM

## 2024-03-23 DIAGNOSIS — E785 Hyperlipidemia, unspecified: Secondary | ICD-10-CM

## 2024-03-23 DIAGNOSIS — Z8639 Personal history of other endocrine, nutritional and metabolic disease: Secondary | ICD-10-CM | POA: Diagnosis not present

## 2024-03-23 LAB — COMPREHENSIVE METABOLIC PANEL WITH GFR
ALT: 22 U/L (ref 0–53)
AST: 15 U/L (ref 0–37)
Albumin: 4.4 g/dL (ref 3.5–5.2)
Alkaline Phosphatase: 101 U/L (ref 39–117)
BUN: 16 mg/dL (ref 6–23)
CO2: 29 meq/L (ref 19–32)
Calcium: 9.2 mg/dL (ref 8.4–10.5)
Chloride: 103 meq/L (ref 96–112)
Creatinine, Ser: 0.93 mg/dL (ref 0.40–1.50)
GFR: 79.49 mL/min (ref >60.00)
Glucose, Bld: 113 mg/dL — ABNORMAL HIGH (ref 70–99)
Potassium: 4.5 meq/L (ref 3.5–5.1)
Sodium: 139 meq/L (ref 135–145)
Total Bilirubin: 0.6 mg/dL (ref 0.2–1.2)
Total Protein: 6.3 g/dL (ref 6.0–8.3)

## 2024-03-23 LAB — LIPID PANEL
Cholesterol: 139 mg/dL (ref 0–200)
HDL: 55.7 mg/dL (ref >39.00)
LDL Cholesterol: 67 mg/dL (ref 0–99)
NonHDL: 82.83
Total CHOL/HDL Ratio: 2
Triglycerides: 77 mg/dL (ref 0.0–149.0)
VLDL: 15.4 mg/dL (ref 0.0–40.0)

## 2024-03-23 LAB — HEMOGLOBIN A1C: Hgb A1c MFr Bld: 6.2 % (ref 4.6–6.5)

## 2024-03-23 LAB — VITAMIN D 25 HYDROXY (VIT D DEFICIENCY, FRACTURES): VITD: 22.6 ng/mL — ABNORMAL LOW (ref 30.00–100.00)

## 2024-03-23 LAB — PSA, MEDICARE: PSA: 0.01 ng/mL — ABNORMAL LOW (ref 0.10–4.00)

## 2024-03-30 ENCOUNTER — Encounter: Payer: Self-pay | Admitting: Family Medicine

## 2024-03-30 ENCOUNTER — Ambulatory Visit (INDEPENDENT_AMBULATORY_CARE_PROVIDER_SITE_OTHER): Payer: PPO | Admitting: Family Medicine

## 2024-03-30 VITALS — BP 136/65 | HR 64 | Temp 97.9°F | Ht 68.0 in | Wt 177.1 lb

## 2024-03-30 DIAGNOSIS — I2510 Atherosclerotic heart disease of native coronary artery without angina pectoris: Secondary | ICD-10-CM | POA: Diagnosis not present

## 2024-03-30 DIAGNOSIS — J3489 Other specified disorders of nose and nasal sinuses: Secondary | ICD-10-CM | POA: Insufficient documentation

## 2024-03-30 DIAGNOSIS — E559 Vitamin D deficiency, unspecified: Secondary | ICD-10-CM

## 2024-03-30 DIAGNOSIS — L719 Rosacea, unspecified: Secondary | ICD-10-CM

## 2024-03-30 DIAGNOSIS — K219 Gastro-esophageal reflux disease without esophagitis: Secondary | ICD-10-CM

## 2024-03-30 DIAGNOSIS — R5383 Other fatigue: Secondary | ICD-10-CM | POA: Diagnosis not present

## 2024-03-30 DIAGNOSIS — Z7189 Other specified counseling: Secondary | ICD-10-CM

## 2024-03-30 DIAGNOSIS — Z0000 Encounter for general adult medical examination without abnormal findings: Secondary | ICD-10-CM | POA: Insufficient documentation

## 2024-03-30 LAB — CBC WITH DIFFERENTIAL/PLATELET
Basophils Absolute: 0 K/uL (ref 0.0–0.1)
Basophils Relative: 0.4 % (ref 0.0–3.0)
Eosinophils Absolute: 0.1 K/uL (ref 0.0–0.7)
Eosinophils Relative: 1.1 % (ref 0.0–5.0)
HCT: 45.3 % (ref 39.0–52.0)
Hemoglobin: 15.3 g/dL (ref 13.0–17.0)
Lymphocytes Relative: 14.9 % (ref 12.0–46.0)
Lymphs Abs: 0.7 K/uL (ref 0.7–4.0)
MCHC: 33.8 g/dL (ref 30.0–36.0)
MCV: 97.8 fl (ref 78.0–100.0)
Monocytes Absolute: 0.5 K/uL (ref 0.1–1.0)
Monocytes Relative: 11.4 % (ref 3.0–12.0)
Neutro Abs: 3.4 K/uL (ref 1.4–7.7)
Neutrophils Relative %: 72.2 % (ref 43.0–77.0)
Platelets: 158 K/uL (ref 150.0–400.0)
RBC: 4.63 Mil/uL (ref 4.22–5.81)
RDW: 12.9 % (ref 11.5–15.5)
WBC: 4.7 K/uL (ref 4.0–10.5)

## 2024-03-30 LAB — TSH: TSH: 1.96 u[IU]/mL (ref 0.35–5.50)

## 2024-03-30 MED ORDER — METRONIDAZOLE 0.75 % EX GEL: 1.0000 | Freq: Every day | CUTANEOUS | 12 refills | Status: AC

## 2024-03-30 MED ORDER — SILDENAFIL CITRATE 20 MG PO TABS: ORAL_TABLET | ORAL | 12 refills | Status: DC

## 2024-03-30 MED ORDER — PANTOPRAZOLE SODIUM 20 MG PO TBEC: 20.0000 mg | DELAYED_RELEASE_TABLET | Freq: Every day | ORAL | 3 refills | Status: AC | PRN

## 2024-03-30 MED ORDER — SILDENAFIL CITRATE 20 MG PO TABS: ORAL_TABLET | ORAL | 12 refills | Status: AC

## 2024-03-30 NOTE — Assessment & Plan Note (Signed)
 Advance directive- wife designated if patient were incapacitated.

## 2024-03-30 NOTE — Patient Instructions (Addendum)
 Go to the lab on the way out.   If you have mychart we'll likely use that to update you.    Take care.  Glad to see you.  Ask the eye clinic to check your eye pressure and ask about dry eyes.    If no eye issues identified, you could try taking claritin (not claritin D).  Higher dose vitamin D .    Update me as needed.

## 2024-03-30 NOTE — Assessment & Plan Note (Signed)
 Had cardiology eval.  He is still doing high intensity training w/o CP. Goal for 3 times per week.  Not SOB.  No BLE edema.   Continue atorvastatin , diet and exercise.   Labs d/w pt.

## 2024-03-30 NOTE — Assessment & Plan Note (Signed)
  Flu 2024 Shingles previously done PNA previously done Tetanus 2021 COVID-vaccine previously done RSV d/w pt.   Colonoscopy 2020 Prostate cancer screening-2025, discussed getting PSA done here yearly. Advance directive-wife designated if patient were incapacitated.

## 2024-03-30 NOTE — Assessment & Plan Note (Signed)
 D/w pt about labs.  He had been taking 2000 units.  D/w pt about taking 4000 units.  We can recheck with next set of yearly labs.

## 2024-03-30 NOTE — Assessment & Plan Note (Signed)
 Assuming unremarkable eye exam today, could try using claritin but would avoid claritin D.

## 2024-03-30 NOTE — Progress Notes (Signed)
 He has variable rhinorrhea, going on for 6 months.  Variable pressure at the eyes in the AMs.  He is going to ask the eye clinic today about rechecking his eye pressure.  No fevers.    Some fatigue.  Occ napping in the afternoon, not daily.  Happens a few times a week.  He doesn't feel abnormally tired.    CAD. He is still doing high intensity training w/o CP. Goal for 3 times per week.  Not SOB.  No BLE edema.   Using statin without problems: yes Muscle aches: no Diet compliance: yes Exercise:yes Labs d/w pt.   It doesn't appear that he would need to take co Q 10.    Still on PPI at baseline.  GERD controlled.  No ADE on med.  Swallowing well.    Still on metrogel  at baseline with relief.  No ADE on med. It helps with rosacea.    Low vit D.  D/w pt about labs.  He had been taking 2000 units.  D/w pt about taking 4000 units.    Flu 2024 Shingles previously done PNA previously done Tetanus 2021 COVID-vaccine previously done RSV d/w pt.   Colonoscopy 2020 Prostate cancer screening-2025, discussed getting PSA done here yearly. Advance directive-wife designated if patient were incapacitated.  PMH and SH reviewed  Meds, vitals, and allergies reviewed.   ROS: Per HPI unless specifically indicated in ROS section   GEN: nad, alert and oriented HEENT: mucous membranes moist, OP wnl, nasal exam stuffy.  NECK: supple w/o LA CV: rrr. PULM: ctab, no inc wob ABD: soft, +bs EXT: no edema SKIN: no acute rash

## 2024-03-30 NOTE — Assessment & Plan Note (Signed)
 Continue protonix  as is.

## 2024-03-30 NOTE — Assessment & Plan Note (Signed)
 See notes on labs.

## 2024-03-30 NOTE — Assessment & Plan Note (Signed)
Continue metrogel

## 2024-04-05 ENCOUNTER — Ambulatory Visit: Payer: Self-pay | Admitting: Family Medicine

## 2024-04-10 DIAGNOSIS — H5713 Ocular pain, bilateral: Secondary | ICD-10-CM | POA: Diagnosis not present

## 2024-06-10 ENCOUNTER — Other Ambulatory Visit: Payer: Self-pay | Admitting: Cardiology

## 2024-06-30 DIAGNOSIS — Z9841 Cataract extraction status, right eye: Secondary | ICD-10-CM | POA: Diagnosis not present

## 2024-06-30 DIAGNOSIS — Z9842 Cataract extraction status, left eye: Secondary | ICD-10-CM | POA: Diagnosis not present

## 2024-06-30 DIAGNOSIS — H5203 Hypermetropia, bilateral: Secondary | ICD-10-CM | POA: Diagnosis not present

## 2024-06-30 DIAGNOSIS — H26493 Other secondary cataract, bilateral: Secondary | ICD-10-CM | POA: Diagnosis not present

## 2024-06-30 DIAGNOSIS — D3132 Benign neoplasm of left choroid: Secondary | ICD-10-CM | POA: Diagnosis not present

## 2024-06-30 LAB — OPHTHALMOLOGY REPORT-SCANNED

## 2024-07-16 ENCOUNTER — Encounter: Payer: Self-pay | Admitting: Family Medicine

## 2024-07-20 NOTE — Progress Notes (Unsigned)
 Cardiology Office Note    Date:  07/21/2024   ID:  CAULDER Stewart, DOB 1946/06/05, MRN [id number]  PCP:  Cleatus Arlyss RAMAN, MD  Cardiologist:  Redell Cave, MD  Electrophysiologist:  None   Chief Complaint: Follow-up  History of Present Illness:   Shane Stewart is a 78 y.o. male with history of CAD with coronary calcification in the LAD, LCx, and RCA on calcium  score, HLD, prostate cancer, and GERD who presents for follow up of CAD and HLD.   Calcium  score in 04/2023 of 984 which was the 77th percentile.  In this setting, he was referred to cardiology for risk stratification.  He established care with Dr. Cave on 05/31/2023 and was tolerating recently initiated atorvastatin  and aspirin .  He reported he had a sister with an MI in her 32s.  He was without symptoms of angina or cardiac decompensation.  Echo on 06/17/2023 showed an EF of 60 to 65%, no regional wall motion abnormalities, mild LVH, grade 1 diastolic dysfunction, normal RV systolic function and ventricular cavity size, no significant valvular abnormalities, and an estimated right atrial pressure of 3 mmHg.  He was most recently seen in the office in 11/2023 and remained active at baseline without cardiac limitation.  No changes in cardiac pharmacotherapy were indicated at that time.  He comes in accompanied by his wife today continues to do very well from a cardiac perspective, without symptoms of angina or cardiac decompensation.  Remains very active at baseline, walking/jogging 3 miles several days per week without cardiac limitation.  No falls or symptoms concerning for bleeding.  No lower extremity swelling or progressive orthopnea.  No dizziness, presyncope, or syncope.  Overall feels well at this time and does not have any acute cardiac concerns.   Labs independently reviewed: 03/2024 - Hgb 15.3, PLT 158, TSH normal 02/2024 - potassium 4.5, BUN 16, serum creatinine 0.93, albumin 4.4, AST/ALT normal, TC 139, TG 77, HDL 55,  LDL 67, A1c 6.2  Past Medical History:  Diagnosis Date   Actinic keratoses    Allergy    spring, fall   Cancer [facility])    prostate   Cataract 12/12/2017   right eye forming    Diverticulosis of colon 01/1999   Dysplastic nevus 09/30/2023   left mid back paraspinal, mod to severe, scheduled for excision   GERD (gastroesophageal reflux disease)    History of CT scan 04/1985   L/S, H/P L5/S1   Hyperlipidemia    Medicare annual wellness visit, subsequent 03/20/2023   Prostate cancer (HCC)    Right knee pain    better with hyaluronic acid   Rosacea     Past Surgical History:  Procedure Laterality Date   APPENDECTOMY     CATARACT EXTRACTION     COLONOSCOPY     CYSTOSCOPY N/A 07/16/2017   Procedure: CYSTOSCOPY;  Surgeon: Penne Knee, MD;  Location: [facility] ORS;  Service: Urology;  Laterality: N/A;   LUMBAR SPINE SURGERY  1986   LS ruptured disc repair   POLYPECTOMY     RADIOACTIVE SEED IMPLANT N/A 07/16/2017   Procedure: RADIOACTIVE SEED IMPLANT/BRACHYTHERAPY IMPLANT;  Surgeon: Penne Knee, MD;  Location: [facility] ORS;  Service: Urology;  Laterality: N/A;   RADIOACTIVE SEED IMPLANT     Prostate- [facility]   skin excision  2020   multiple    TONSILLECTOMY     UMBILICAL HERNIA REPAIR  10/05/2005    Current Medications: Current Meds  Medication Sig   aspirin   EC 81 MG tablet Take 1 tablet (81 mg total) by mouth daily. Swallow whole.   atorvastatin  (LIPITOR) 20 MG tablet TAKE 1 TABLET BY MOUTH EVERY DAY   Cholecalciferol  50 MCG (2000 UT) TABS Take 2 tablets (4,000 Units total) by mouth daily.   folic acid (FOLVITE) 400 MCG tablet Take 400 mcg by mouth daily.   metroNIDAZOLE  (METROGEL ) 0.75 % gel Apply 1 Application topically daily.   pantoprazole  (PROTONIX ) 20 MG tablet Take 1 tablet (20 mg total) by mouth daily as needed.   sildenafil  (REVATIO ) 20 MG tablet TAKE 3-5 TABLETS BY MOUTH DAILY AS NEEDED   vitamin B-12 (CYANOCOBALAMIN) 1000 MCG tablet Take 1,000  mcg by mouth daily.   vitamin C (ASCORBIC ACID) 500 MG tablet Take 500 mg by mouth daily.   zinc  gluconate 50 MG tablet Take 50 mg by mouth daily.    Allergies:   Omeprazole    Social History   Socioeconomic History   Marital status: Married    Spouse name: Not on file   Number of children: 0   Years of education: Not on file   Highest education level: Bachelor's degree (e.g., BA, AB, BS)  Occupational History   Occupation: MGR//Contractor Self Employed    Employer: retired  Tobacco Use   Smoking status: Never   Smokeless tobacco: Never  Vaping Use   Vaping status: Never Used  Substance and Sexual Activity   Alcohol use: No   Drug use: Never   Sexual activity: Yes  Other Topics Concern   Not on file  Social History Narrative   Remarried 2022.  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    Social Drivers of Corporate Investment Banker Strain: Low Risk  (03/29/2024)   Overall Financial Resource Strain (CARDIA)    Difficulty of Paying Living Expenses: Not hard at all  Food Insecurity: No Food Insecurity (03/29/2024)   Hunger Vital Sign    Worried About Running Out of Food in the Last Year: Never true    Ran Out of Food in the Last Year: Never true  Transportation Needs: No Transportation Needs (03/29/2024)   PRAPARE - Administrator, Civil Service (Medical): No    Lack of Transportation (Non-Medical): No  Physical Activity: Insufficiently Active (03/29/2024)   Exercise Vital Sign    Days of Exercise per Week: 3 days    Minutes of Exercise per Session: 40 min  Stress: No Stress Concern Present (03/29/2024)   [employer] of Occupational Health - Occupational Stress Questionnaire    Feeling of Stress: Not at all  Social Connections:  Socially Integrated (03/29/2024)   Social Connection and Isolation Panel    Frequency of Communication with Friends and Family: More than three times a week    Frequency of Social Gatherings with Friends and Family: Once a week    Attends Religious Services: More than 4 times per year    Active Member of [employer] or Organizations: Yes    Attends Engineer, Structural: More than 4 times per year    Marital Status: Married     Family History:  The patient's family history includes Alzheimer's disease in his mother; Cancer in his father, sister, and sister; Cancer - Colon (  age of onset: 15) in his father; Colon cancer in his father; Dementia in his mother; Diabetes in his father and sister; Heart disease in his father and sister; Hypertension in his sister. There is no history of Prostate cancer, Esophageal cancer, Rectal cancer, or Stomach cancer.  ROS:   12-point review of systems is negative unless otherwise noted in the HPI.   EKGs/Labs/Other Studies Reviewed:    Studies reviewed were summarized above. The additional studies were reviewed today:  2D echo 06/17/2023: 1. Left ventricular ejection fraction, by estimation, is 60 to 65%. The  left ventricle has normal function. The left ventricle has no regional  wall motion abnormalities. There is mild left ventricular hypertrophy.  Left ventricular diastolic parameters  are consistent with Grade I diastolic dysfunction (impaired relaxation).  The average left ventricular global longitudinal strain is -19.6 %. The  global longitudinal strain is normal.   2. Right ventricular systolic function is normal. The right ventricular  size is normal.   3. The mitral valve is normal in structure. No evidence of mitral valve  regurgitation. No evidence of mitral stenosis.   4. The aortic valve is tricuspid. Aortic valve regurgitation is not  visualized. No aortic stenosis is present.   5. The inferior vena cava is normal in size with greater  than 50%  respiratory variability, suggesting right atrial pressure of 3 mmHg.  __________   Calcium  score 05/02/2023: Ascending Aorta: Normal size   Pericardium: Normal   Coronary arteries: Normal origin of left and right coronary arteries. Distribution of arterial calcifications if present, as noted below;   LM 0   LAD 228   LCx 733   RCA 23.5   Total 984   IMPRESSION AND RECOMMENDATION: 1. Coronary calcium  score of 984. This was 77th percentile for age and sex matched control. 2. CAC >300 in LAD, LCx, RCA. CAC-DRS A3/N3. 3. Recommend aspirin  and statin if no contraindication. 4. Recommend cardiology consultation. 5. Continue heart healthy lifestyle and risk factor modification.   EKG:  EKG is ordered today.  The EKG ordered today demonstrates sinus bradycardia, 58 bpm, no acute st/t changes  Recent Labs: 03/23/2024: ALT 22; BUN 16; Creatinine, Ser 0.93; Potassium 4.5; Sodium 139 03/30/2024: Hemoglobin 15.3; Platelets 158.0; TSH 1.96  Recent Lipid Panel    Component Value Date/Time   CHOL 139 03/23/2024 0803   TRIG 77.0 03/23/2024 0803   HDL 55.70 03/23/2024 0803   CHOLHDL 2 03/23/2024 0803   VLDL 15.4 03/23/2024 0803   LDLCALC 67 03/23/2024 0803   LDLDIRECT 158.3 09/13/2008 0910    PHYSICAL EXAM:    VS:  BP 128/64   Pulse (!) 58 Comment: 75 oximeter  Ht 5' 8 (1.727 m)   Wt 182 lb 9.6 oz (82.8 kg)   SpO2 97%   BMI 27.76 kg/m   BMI: Body mass index is 27.76 kg/m.  Physical Exam Vitals reviewed.  Constitutional:      Appearance: He is well-developed.  HENT:     Head: Normocephalic and atraumatic.  Eyes:     General:        Right eye: No discharge.        Left eye: No discharge.  Cardiovascular:     Rate and Rhythm: Regular rhythm. Bradycardia present.     Pulses:          Posterior tibial pulses are 2+ on the right side and 2+ on the left side.     Heart sounds: Normal heart sounds, S1  normal and S2 normal. Heart sounds not distant. No midsystolic  click and no opening snap. No murmur heard.    No friction rub.  Pulmonary:     Effort: Pulmonary effort is normal. No respiratory distress.     Breath sounds: Normal breath sounds. No decreased breath sounds, wheezing, rhonchi or rales.  Musculoskeletal:     Cervical back: Normal range of motion.     Right lower leg: No edema.     Left lower leg: No edema.  Skin:    General: Skin is warm and dry.     Nails: There is no clubbing.  Neurological:     Mental Status: He is alert and oriented to person, place, and time.  Psychiatric:        Speech: Speech normal.        Behavior: Behavior normal.        Thought Content: Thought content normal.        Judgment: Judgment normal.     Wt Readings from Last 3 Encounters:  07/21/24 182 lb 9.6 oz (82.8 kg)  03/30/24 177 lb 2 oz (80.3 kg)  01/14/24 175 lb (79.4 kg)     ASSESSMENT & PLAN:   CAD involving native coronary arteries without angina: He continues to do very well and is without symptoms of angina or cardiac decompensation.  Remains very active at baseline with interval training.  Continue aggressive risk factor modification and primary prevention including aspirin  81 mg and atorvastatin  20 mg.  HLD: LDL 67 in 02/2024 with normal AST/ALT at that time.  Remains on atorvastatin  20 mg.     Disposition: F/u with Dr. Darliss or an APP in 12 months, sooner if needed.   Medication Adjustments/Labs and Tests Ordered: Current medicines are reviewed at length with the patient today.  Concerns regarding medicines are outlined above. Medication changes, Labs and Tests ordered today are summarized above and listed in the Patient Instructions accessible in Encounters.   Signed, Bernardino Bring, PA-C 07/21/2024 4:23 PM     [facility] [place] [address] [place], [place] [address] [phone]

## 2024-07-21 ENCOUNTER — Ambulatory Visit: Attending: Physician Assistant | Admitting: Physician Assistant

## 2024-07-21 ENCOUNTER — Encounter: Payer: Self-pay | Admitting: Physician Assistant

## 2024-07-21 VITALS — BP 128/64 | HR 58 | Ht 68.0 in | Wt 182.6 lb

## 2024-07-21 DIAGNOSIS — H02831 Dermatochalasis of right upper eyelid: Secondary | ICD-10-CM | POA: Diagnosis not present

## 2024-07-21 DIAGNOSIS — E785 Hyperlipidemia, unspecified: Secondary | ICD-10-CM

## 2024-07-21 DIAGNOSIS — I2510 Atherosclerotic heart disease of native coronary artery without angina pectoris: Secondary | ICD-10-CM | POA: Diagnosis not present

## 2024-07-21 DIAGNOSIS — H18413 Arcus senilis, bilateral: Secondary | ICD-10-CM | POA: Diagnosis not present

## 2024-07-21 DIAGNOSIS — Z961 Presence of intraocular lens: Secondary | ICD-10-CM | POA: Diagnosis not present

## 2024-07-21 DIAGNOSIS — H26491 Other secondary cataract, right eye: Secondary | ICD-10-CM | POA: Diagnosis not present

## 2024-07-21 DIAGNOSIS — H26493 Other secondary cataract, bilateral: Secondary | ICD-10-CM | POA: Diagnosis not present

## 2024-07-21 NOTE — Patient Instructions (Signed)
 Medication Instructions:  Your physician recommends that you continue on your current medications as directed. Please refer to the Current Medication list given to you today.   *If you need a refill on your cardiac medications before your next appointment, please call your pharmacy*  Lab Work: None ordered at this time   Follow-Up: At [facility], you and your health needs are our priority.  As part of our continuing mission to provide you with exceptional heart care, our providers are all part of one team.  This team includes your primary Cardiologist (physician) and Advanced Practice Providers or APPs (Physician Assistants and Nurse Practitioners) who all work together to provide you with the care you need, when you need it.  Your next appointment:   12 month(s)  Provider:   You may see Redell Cave, MD or one of the following Advanced Practice Providers on your designated Care Team:   Lonni Meager, NP Lesley Maffucci, PA-C Bernardino Bring, PA-C Cadence [place], PA-C Tylene Lunch, NP Barnie Hila, NP

## 2024-08-11 DIAGNOSIS — Z961 Presence of intraocular lens: Secondary | ICD-10-CM | POA: Diagnosis not present

## 2024-08-11 DIAGNOSIS — H26492 Other secondary cataract, left eye: Secondary | ICD-10-CM | POA: Diagnosis not present

## 2024-09-08 ENCOUNTER — Encounter: Payer: Self-pay | Admitting: Gastroenterology

## 2024-09-08 ENCOUNTER — Ambulatory Visit: Admitting: Gastroenterology

## 2024-09-08 VITALS — BP 126/78 | HR 71 | Ht 68.0 in | Wt 182.0 lb

## 2024-09-08 DIAGNOSIS — K219 Gastro-esophageal reflux disease without esophagitis: Secondary | ICD-10-CM

## 2024-09-08 DIAGNOSIS — Z800 Family history of malignant neoplasm of digestive organs: Secondary | ICD-10-CM

## 2024-09-08 MED ORDER — NA SULFATE-K SULFATE-MG SULF 17.5-3.13-1.6 GM/177ML PO SOLN: 1.0000 | Freq: Once | ORAL | 0 refills | Status: AC

## 2024-09-08 NOTE — Progress Notes (Signed)
 Shane Stewart:  History: Shane Stewart 09/08/2024  Referring provider: Cleatus Arlyss RAMAN, MD  Reason for consult/chief complaint: Colonoscopy (Pt states he is her to discuss having an colonoscopy )   Subjective  Prior history:  GERD with dysphagia 2019 EGD May 2019 with reflux esophagitis, salmon-colored mucosa without Barrett's on biopsies, no stricture Patient was doing well at follow-up July 2019, worked on weaning off PPI  Family history colorectal cancer (father) Diminutive tubular adenoma 2015 No polyps December 2020, radiation proctopathy seen   History of Present Illness   Shane Stewart is here today to discuss a screening colonoscopy due to family history. Bowel habits regular without abdominal pain or rectal bleeding. He tried to wean off of the PPI at least 1 time since I saw him last but he has frequent recurrence of heartburn so ultimately went back on it once daily and is happy with that.  It happened several months ago on a trip to [place] when he forgot to bring his pantoprazole .  He denies dysphagia, odynophagia, nausea vomiting or weight change.  Currently taking pantoprazole  20 mg once daily prescribed by PCP.   ROS:  Review of Systems He denies chest pain dyspnea or dysuria  Past Medical History: Past Medical History:  Diagnosis Date   Actinic keratoses    Allergy    spring, fall   Cancer [facility])    prostate   Cataract 12/12/2017   right eye forming    Diverticulosis of colon 01/1999   Dysplastic nevus 09/30/2023   left mid back paraspinal, mod to severe, scheduled for excision   GERD (gastroesophageal reflux disease)    History of CT scan 04/1985   L/S, H/P L5/S1   Hyperlipidemia    Medicare annual wellness visit, subsequent 03/20/2023   Prostate cancer (HCC)    Right knee pain    better with hyaluronic acid   Rosacea    07/21/2024 cardiology clinic Stewart includes the following: Calcium  score in 04/2023 of 984 which  was the 77th percentile. In this setting, he was referred to cardiology for risk stratification. He established care with Dr. Darliss on 05/31/2023 and was tolerating recently initiated atorvastatin  and aspirin . He reported he had a sister with an MI in her 29s. He was without symptoms of angina or cardiac decompensation. Echo on 06/17/2023 showed an EF of 60 to 65%, no regional wall motion abnormalities, mild LVH, grade 1 diastolic dysfunction, normal RV systolic function and ventricular cavity size, no significant valvular abnormalities, and an estimated right atrial pressure of 3 mmHg. He was most recently seen in the office in 11/2023 and remained active at baseline without cardiac limitation. No changes in cardiac pharmacotherapy were indicated at that time.   Patient without angina or symptoms of heart failure at that visit.  Remains active   Past Surgical History: Past Surgical History:  Procedure Laterality Date   APPENDECTOMY     CATARACT EXTRACTION     COLONOSCOPY     CYSTOSCOPY N/A 07/16/2017   Procedure: CYSTOSCOPY;  Surgeon: Penne Knee, MD;  Location: [facility] ORS;  Service: Urology;  Laterality: N/A;   LUMBAR SPINE SURGERY  1986   LS ruptured disc repair   POLYPECTOMY     RADIOACTIVE SEED IMPLANT N/A 07/16/2017   Procedure: RADIOACTIVE SEED IMPLANT/BRACHYTHERAPY IMPLANT;  Surgeon: Penne Knee, MD;  Location: [facility] ORS;  Service: Urology;  Laterality: N/A;   RADIOACTIVE SEED IMPLANT     Prostate- [facility]   skin excision  2020   multiple    TONSILLECTOMY     UMBILICAL HERNIA REPAIR  10/05/2005     Family History: Family History  Problem Relation Age of Onset   Alzheimer's disease Mother    Dementia Mother    Heart disease Father        CAD   Diabetes Father    Cancer - Colon Father 41       colon   Colon cancer Father    Cancer Father    Diabetes Sister    Heart disease Sister        MI CABG x 3   Hypertension Sister    Cancer Sister     Cancer Sister    Prostate cancer Neg Hx    Esophageal cancer Neg Hx    Rectal cancer Neg Hx    Stomach cancer Neg Hx     Social History: Social History   Socioeconomic History   Marital status: Married    Spouse name: Not on file   Number of children: 0   Years of education: Not on file   Highest education level: Bachelor's degree (e.g., BA, AB, BS)  Occupational History   Occupation: MGR//Contractor Self Employed    Employer: retired  Tobacco Use   Smoking status: Never   Smokeless tobacco: Never  Vaping Use   Vaping status: Never Used  Substance and Sexual Activity   Alcohol use: No   Drug use: Never   Sexual activity: Yes  Other Topics Concern   Not on file  Social History Narrative   Remarried 2022.  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    Social Drivers of Health   Tobacco Use: Low Risk (09/08/2024)   Patient History    Smoking Tobacco Use: Never    Smokeless Tobacco Use: Never    Passive Exposure: Not on file  Financial Resource Strain: Low Risk (03/29/2024)   Overall Financial Resource Strain (CARDIA)    Difficulty of Paying Living Expenses: Not hard at all  Food Insecurity: No Food Insecurity (03/29/2024)   Epic    Worried About Radiation Protection Practitioner of Food in the Last Year: Never true    Ran Out of Food in the Last Year: Never true  Transportation Needs: No Transportation Needs (03/29/2024)   Epic    Lack of Transportation (Medical): No    Lack of Transportation (Non-Medical): No  Physical Activity: Insufficiently Active (03/29/2024)   Exercise Vital Sign    Days of Exercise per Week: 3 days    Minutes of Exercise per Session: 40 min  Stress: No Stress Concern Present (03/29/2024)   [employer] of Occupational Health - Occupational  Stress Questionnaire    Feeling of Stress: Not at all  Social Connections: Socially Integrated (03/29/2024)   Social Connection and Isolation Panel    Frequency of Communication with Friends and Family: More than three times a week    Frequency of Social Gatherings with Friends and Family: Once a week    Attends Religious Services: More than 4 times per year    Active Member of [employer] or Organizations: Yes    Attends Banker Meetings: More than 4 times per year    Marital Status: Married  Depression (  PHQ2-9): Low Risk (03/30/2024)   Depression (PHQ2-9)    PHQ-2 Score: 1  Alcohol Screen: Low Risk (01/10/2024)   Alcohol Screen    Last Alcohol Screening Score (AUDIT): 0  Housing: Low Risk (03/29/2024)   Epic    Unable to Pay for Housing in the Last Year: No    Number of Times Moved in the Last Year: 0    Homeless in the Last Year: No  Utilities: Not At Risk (01/14/2024)   AHC Utilities    Threatened with loss of utilities: No  Health Literacy: Not on file    Allergies: Allergies[1]  Outpatient Meds: Current Outpatient Medications  Medication Sig Dispense Refill   aspirin  EC 81 MG tablet Take 1 tablet (81 mg total) by mouth daily. Swallow whole.     atorvastatin  (LIPITOR) 20 MG tablet TAKE 1 TABLET BY MOUTH EVERY DAY 90 tablet 2   Cholecalciferol  50 MCG (2000 UT) TABS Take 2 tablets (4,000 Units total) by mouth daily. 30 tablet    folic acid (FOLVITE) 400 MCG tablet Take 400 mcg by mouth daily.     metroNIDAZOLE  (METROGEL ) 0.75 % gel Apply 1 Application topically daily. 45 g 12   pantoprazole  (PROTONIX ) 20 MG tablet Take 1 tablet (20 mg total) by mouth daily as needed. 90 tablet 3   sildenafil  (REVATIO ) 20 MG tablet TAKE 3-5 TABLETS BY MOUTH DAILY AS NEEDED 50 tablet 12   vitamin B-12 (CYANOCOBALAMIN) 1000 MCG tablet Take 1,000 mcg by mouth daily.     vitamin C (ASCORBIC ACID) 500 MG tablet Take 500 mg by mouth daily.     zinc  gluconate 50 MG tablet Take 50 mg by mouth  daily.     No current facility-administered medications for this visit.      ___________________________________________________________________ Objective   Exam:  BP 126/78   Pulse 71   Ht 5' 8 (1.727 m)   Wt 182 lb (82.6 kg)   BMI 27.67 kg/m  Wt Readings from Last 3 Encounters:  09/08/24 182 lb (82.6 kg)  07/21/24 182 lb 9.6 oz (82.8 kg)  03/30/24 177 lb 2 oz (80.3 kg)   Wife present for entire visit General: Well-appearing, gets on exam table easily Eyes: sclera anicteric, no redness ENT: oral mucosa moist without lesions, no cervical or supraclavicular lymphadenopathy CV: Regular without appreciable murmur, no JVD, no peripheral edema Resp: clear to auscultation bilaterally, normal RR and effort noted GI: soft, no tenderness, with active bowel sounds. No guarding or palpable organomegaly noted. Skin; warm and dry, no rash or jaundice noted Neuro: awake, alert and oriented x 3. Normal gross motor function and fluent speech      Latest Ref Rng & Units 03/30/2024   10:20 AM 07/05/2017    9:29 AM 06/17/2017    9:18 AM  CBC  WBC 4.0 - 10.5 K/uL 4.7  3.8  3.5   Hemoglobin 13.0 - 17.0 g/dL 84.6  84.8  85.1   Hematocrit 39.0 - 52.0 % 45.3  43.4  41.7   Platelets 150.0 - 400.0 K/uL 158.0  139  122       Latest Ref Rng & Units 03/23/2024    8:03 AM 07/09/2023    7:32 AM 03/11/2023    8:01 AM  CMP  Glucose 70 - 99 mg/dL 886   897   BUN 6 - 23 mg/dL 16   14   Creatinine 9.59 - 1.50 mg/dL 9.06   9.01   Sodium 864 - 145 mEq/L 139  138   Potassium 3.5 - 5.1 mEq/L 4.5   4.5   Chloride 96 - 112 mEq/L 103   103   CO2 19 - 32 mEq/L 29   30   Calcium  8.4 - 10.5 mg/dL 9.2   9.2   Total Protein 6.0 - 8.3 g/dL 6.3  6.4  6.4   Total Bilirubin 0.2 - 1.2 mg/dL 0.6  0.5  0.8   Alkaline Phos 39 - 117 U/L 101  98  81   AST 0 - 37 U/L 15  19  14    ALT 0 - 53 U/L 22  27  17       Encounter Diagnoses  Name Primary?   Gastroesophageal reflux disease without esophagitis Yes    Family history of colon cancer in father     Assessment & Plan  Well-controlled GERD symptoms without red flag signs, maintained on once daily PPI prescribed by primary care.  Increased risk colorectal cancer due to family history.  Due for screening colonoscopy.  We discussed the procedure along with risks and benefits and he was agreeable.  The benefits and risks of the planned procedure(s) were described in detail with the patient or (when appropriate) their health care proxy.  Risks were outlined as including, but not limited to, bleeding, infection, perforation, adverse medication reaction leading to cardiac or pulmonary decompensation, pancreatitis (if ERCP).  The limitation of incomplete mucosal visualization was also discussed.  No guarantees or warranties were given.  If low risk findings, this should be his last screening exam  Thank you for the courtesy of this consult.  Please call me with any questions or concerns.  Victory LITTIE Brand III  CC: Referring provider noted above     [1]  Allergies Allergen Reactions   Omeprazole      Inc in BP.

## 2024-09-08 NOTE — Patient Instructions (Signed)
 You have been scheduled for a colonoscopy. Please follow written instructions given to you at your visit today.   If you use inhalers (even only as needed), please bring them with you on the day of your procedure.  DO NOT TAKE 7 DAYS PRIOR TO TEST- Trulicity (dulaglutide) Ozempic, Wegovy (semaglutide) Mounjaro, Zepbound (tirzepatide) Bydureon Bcise (exanatide extended release)  DO NOT TAKE 1 DAY PRIOR TO YOUR TEST Rybelsus (semaglutide) Adlyxin (lixisenatide) Victoza (liraglutide) Byetta (exanatide) ___________________________________________________________________________  _______________________________________________________  If your blood pressure at your visit was 140/90 or greater, please contact your primary care physician to follow up on this.  _______________________________________________________  If you are age 39 or older, your body mass index should be between 23-30. Your Body mass index is 27.67 kg/m. If this is out of the aforementioned range listed, please consider follow up with your Primary Care Provider.  If you are age 24 or younger, your body mass index should be between 19-25. Your Body mass index is 27.67 kg/m. If this is out of the aformentioned range listed, please consider follow up with your Primary Care Provider.   ________________________________________________________  The [facility] providers would like to encourage you to use MYCHART to communicate with providers for non-urgent requests or questions.  Due to long hold times on the telephone, sending your provider a message by [facility] may be a faster and more efficient way to get a response.  Please allow 48 business hours for a response.  Please remember that this is for non-urgent requests.  _______________________________________________________  [facility] is using a team-based approach to care.  Your team is made up of your doctor and two to three APPS. Our APPS (Nurse  Practitioners and Physician Assistants) work with your physician to ensure care continuity for you. They are fully qualified to address your health concerns and develop a treatment plan. They communicate directly with your gastroenterologist to care for you. Seeing the Advanced Practice Practitioners on your physician's team can help you by facilitating care more promptly, often allowing for earlier appointments, access to diagnostic testing, procedures, and other specialty referrals.

## 2024-09-30 ENCOUNTER — Encounter: Payer: Self-pay | Admitting: Dermatology

## 2024-09-30 ENCOUNTER — Ambulatory Visit: Admitting: Dermatology

## 2024-09-30 ENCOUNTER — Ambulatory Visit: Payer: PPO | Admitting: Dermatology

## 2024-09-30 DIAGNOSIS — Z1283 Encounter for screening for malignant neoplasm of skin: Secondary | ICD-10-CM

## 2024-09-30 DIAGNOSIS — L820 Inflamed seborrheic keratosis: Secondary | ICD-10-CM

## 2024-09-30 DIAGNOSIS — Z86018 Personal history of other benign neoplasm: Secondary | ICD-10-CM | POA: Diagnosis not present

## 2024-09-30 DIAGNOSIS — D229 Melanocytic nevi, unspecified: Secondary | ICD-10-CM

## 2024-09-30 DIAGNOSIS — L578 Other skin changes due to chronic exposure to nonionizing radiation: Secondary | ICD-10-CM

## 2024-09-30 DIAGNOSIS — L814 Other melanin hyperpigmentation: Secondary | ICD-10-CM

## 2024-09-30 DIAGNOSIS — L821 Other seborrheic keratosis: Secondary | ICD-10-CM | POA: Diagnosis not present

## 2024-09-30 DIAGNOSIS — W908XXA Exposure to other nonionizing radiation, initial encounter: Secondary | ICD-10-CM

## 2024-09-30 DIAGNOSIS — D1801 Hemangioma of skin and subcutaneous tissue: Secondary | ICD-10-CM

## 2024-09-30 NOTE — Progress Notes (Signed)
 "  Follow-Up Visit   Subjective  Shane Stewart is a 79 y.o. male who presents for the following: Skin Cancer Screening and Full Body Skin Exam, hx of precancers. Hx of dysplastic nevus with mod-severe atypia.  New spot under left arm and at left chest.   The patient presents for Total-Body Skin Exam (TBSE) for skin cancer screening and mole check. The patient has spots, moles and lesions to be evaluated, some may be new or changing and the patient may have concern these could be cancer.  The following portions of the chart were reviewed this encounter and updated as appropriate: medications, allergies, medical history  Review of Systems:  No other skin or systemic complaints except as noted in HPI or Assessment and Plan.  Objective  Well appearing patient in no apparent distress; mood and affect are within normal limits.  A full examination was performed including scalp, head, eyes, ears, nose, lips, neck, chest, axillae, abdomen, back, buttocks, bilateral upper extremities, bilateral lower extremities, hands, feet, fingers, toes, fingernails, and toenails. All findings within normal limits unless otherwise noted below.   Relevant physical exam findings are noted in the Assessment and Plan.  L inferior areola x1, L axilla x1 (2) Erythematous keratotic or waxy stuck-on papule or plaque.    Assessment & Plan   SKIN CANCER SCREENING PERFORMED TODAY.  HISTORY OF DYSPLASTIC NEVUS. Left mid back paraspinal, mod-severe, excised by Dr. Corey 11/04/2023. No evidence of recurrence today Recommend regular full body skin exams Recommend daily broad spectrum sunscreen SPF 30+ to sun-exposed areas, reapply every 2 hours as needed.  Call if any new or changing lesions are noted between office visits   ACTINIC DAMAGE - Chronic condition, secondary to cumulative UV/sun exposure - diffuse scaly erythematous macules with underlying dyspigmentation - Recommend daily broad spectrum sunscreen SPF 30+  to sun-exposed areas, reapply every 2 hours as needed.  - Staying in the shade or wearing long sleeves, sun glasses (UVA+UVB protection) and wide brim hats (4-inch brim around the entire circumference of the hat) are also recommended for sun protection.  - Call for new or changing lesions.  LENTIGINES, SEBORRHEIC KERATOSES, HEMANGIOMAS - Benign normal skin lesions - Benign-appearing - Call for any changes  MELANOCYTIC NEVI - Tan-brown and/or pink-flesh-colored symmetric macules and papules - Benign appearing on exam today - Observation - Call clinic for new or changing moles - Recommend daily use of broad spectrum spf 30+ sunscreen to sun-exposed areas.   INFLAMED SEBORRHEIC KERATOSIS (2) L inferior areola x1, L axilla x1 (2) Symptomatic, irritating, patient would like treated. - Destruction of lesion - L inferior areola x1, L axilla x1 (2) Complexity: simple   Destruction method: cryotherapy   Informed consent: discussed and consent obtained   Timeout:  patient name, date of birth, surgical site, and procedure verified Lesion destroyed using liquid nitrogen: Yes   Region frozen until ice ball extended beyond lesion: Yes   Outcome: patient tolerated procedure well with no complications   Post-procedure details: wound care instructions given   Additional details:  Prior to procedure, discussed risks of blister formation, small wound, skin dyspigmentation, or rare scar following cryotherapy. Recommend Vaseline ointment to treated areas while healing.      Return in about 1 year (around 09/30/2025) for TBSE, HxDN, HxAKs.  I, Jill Parcell, CMA, am acting as scribe for Alm Rhyme, MD.   Documentation: I have reviewed the above documentation for accuracy and completeness, and I agree with the above.  Alm  Hester, MD    "

## 2024-09-30 NOTE — Patient Instructions (Addendum)

## 2024-10-05 ENCOUNTER — Ambulatory Visit
Admission: RE | Admit: 2024-10-05 | Discharge: 2024-10-05 | Disposition: A | Discharge status: Home / Self Care | Source: Ambulatory Visit | Attending: Family Medicine | Admitting: Family Medicine

## 2024-10-05 VITALS — BP 131/83 | HR 80 | Temp 97.6°F | Resp 18

## 2024-10-05 DIAGNOSIS — J208 Acute bronchitis due to other specified organisms: Secondary | ICD-10-CM

## 2024-10-05 MED ORDER — BENZONATATE 100 MG PO CAPS: 100.0000 mg | ORAL_CAPSULE | Freq: Three times a day (TID) | ORAL | 0 refills | Status: AC | PRN

## 2024-10-05 NOTE — Discharge Instructions (Addendum)
 I have refilled your Tessalon  Perles to take as needed for your cough.  Continue lots of rest and fluids and follow-up with your PCP if your symptoms do not improve.  Please go to the emergency room for any worsening symptoms.  Hope you feel better soon!

## 2024-10-05 NOTE — ED Triage Notes (Signed)
 Patient complains of productive cough with yellow mucus, nasal congestion and chest congestion x 1 week. Patient has taken tessalon  Perles and OTC cough syrup.

## 2024-10-05 NOTE — ED Provider Notes (Signed)
 " Shane Stewart    CSN: [id number] Arrival date & time: 10/05/24  9146      History   Chief Complaint Chief Complaint  Patient presents with   Nasal Congestion    Coughing and slight burning in chest - Entered by patient   Cough    HPI Shane Stewart is a 79 y.o. male  presents for evaluation of URI symptoms for 7 days. Patient reports associated symptoms of improving cough with yellow mucus, congestion. Denies N/V/D, fevers, sore throat, sinus pressure/pain, body aches, shortness of breath. Patient does not have a hx of asthma. Patient is not an active smoker.   Reports wife has similar symptoms.  Pt has taken cough syrup OTC for symptoms. Pt has no other concerns at this time.    Cough   Past Medical History:  Diagnosis Date   Actinic keratoses    Allergy    spring, fall   Cancer [facility])    prostate   Cataract 12/12/2017   right eye forming    Diverticulosis of colon 01/1999   Dysplastic nevus 09/30/2023   left mid back paraspinal, mod to severe, scheduled for excision   GERD (gastroesophageal reflux disease)    History of CT scan 04/1985   L/S, H/P L5/S1   Hyperlipidemia    Medicare annual wellness visit, subsequent 03/20/2023   Prostate cancer (HCC)    Right knee pain    better with hyaluronic acid   Rosacea     Patient Active Problem List   Diagnosis Date Noted   Health care maintenance 03/30/2024   Other fatigue 03/30/2024   Rhinorrhea 03/30/2024   Coronary artery disease 05/08/2023   Medicare annual wellness visit, subsequent 03/20/2023   Gastroesophageal reflux disease 09/02/2022   History of prostate cancer 06/11/2022   Erectile dysfunction 12/25/2017   Dysphagia 12/25/2017   History of vitamin D  deficiency 12/12/2017   Advance care planning 12/08/2014   Vitamin D  deficiency 10/26/2009   DIVERTICULOSIS, COLON 09/08/2007   Rosacea 09/05/2007    Past Surgical History:  Procedure Laterality Date   APPENDECTOMY     CATARACT EXTRACTION      COLONOSCOPY     CYSTOSCOPY N/A 07/16/2017   Procedure: CYSTOSCOPY;  Surgeon: Penne Knee, MD;  Location: [facility] ORS;  Service: Urology;  Laterality: N/A;   LUMBAR SPINE SURGERY  1986   LS ruptured disc repair   POLYPECTOMY     RADIOACTIVE SEED IMPLANT N/A 07/16/2017   Procedure: RADIOACTIVE SEED IMPLANT/BRACHYTHERAPY IMPLANT;  Surgeon: Penne Knee, MD;  Location: [facility] ORS;  Service: Urology;  Laterality: N/A;   RADIOACTIVE SEED IMPLANT     Prostate- [facility]   skin excision  2020   multiple    TONSILLECTOMY     UMBILICAL HERNIA REPAIR  10/05/2005       Home Medications    Prior to Admission medications  Medication Sig Start Date End Date Taking? Authorizing Provider  aspirin  EC 81 MG tablet Take 1 tablet (81 mg total) by mouth daily. Swallow whole. 05/07/23   Cleatus Arlyss RAMAN, MD  atorvastatin  (LIPITOR) 20 MG tablet TAKE 1 TABLET BY MOUTH EVERY DAY 06/10/24   [name], MD  benzonatate  (TESSALON ) 100 MG capsule Take 1 capsule (100 mg total) by mouth 3 (three) times daily as needed for cough. 10/05/24  Yes [name], NP  Cholecalciferol  50 MCG (2000 UT) TABS Take 2 tablets (4,000 Units total) by mouth daily. 03/18/23   Cleatus Arlyss RAMAN, MD  folic acid (FOLVITE) 400 MCG tablet Take 400 mcg by mouth daily.    [provider]  metroNIDAZOLE  (METROGEL ) 0.75 % gel Apply 1 Application topically daily. 03/30/24   Cleatus Arlyss RAMAN, MD  pantoprazole  (PROTONIX ) 20 MG tablet Take 1 tablet (20 mg total) by mouth daily as needed. 03/30/24   Cleatus Arlyss RAMAN, MD  sildenafil  (REVATIO ) 20 MG tablet TAKE 3-5 TABLETS BY MOUTH DAILY AS NEEDED 03/30/24   Cleatus Arlyss RAMAN, MD  vitamin B-12 (CYANOCOBALAMIN) 1000 MCG tablet Take 1,000 mcg by mouth daily.    [provider]  vitamin C (ASCORBIC ACID) 500 MG tablet Take 500 mg by mouth daily.    [provider]  zinc  gluconate 50 MG tablet Take 50 mg by mouth daily.    [provider]    Family  History Family History  Problem Relation Age of Onset   Alzheimer's disease Mother    Dementia Mother    Heart disease Father        CAD   Diabetes Father    Cancer - Colon Father 76       colon   Colon cancer Father    Cancer Father    Diabetes Sister    Heart disease Sister        MI CABG x 3   Hypertension Sister    Cancer Sister    Cancer Sister    Prostate cancer Neg Hx    Esophageal cancer Neg Hx    Rectal cancer Neg Hx    Stomach cancer Neg Hx     Social History Social History[1]   Allergies   Omeprazole    Review of Systems Review of Systems  HENT:  Positive for congestion.   Respiratory:  Positive for cough.      Physical Exam Triage Vital Signs ED Triage Vitals  Encounter Vitals Group     BP 10/05/24 0917 131/83     Girls Systolic BP Percentile --      Girls Diastolic BP Percentile --      Boys Systolic BP Percentile --      Boys Diastolic BP Percentile --      Pulse Rate 10/05/24 0917 80     Resp 10/05/24 0917 18     Temp 10/05/24 0917 97.6 F (36.4 C)     Temp Source 10/05/24 0917 Oral     SpO2 10/05/24 0917 97 %     Weight --      Height --      Head Circumference --      Peak Flow --      Pain Score 10/05/24 0925 0     Pain Loc --      Pain Education --      Exclude from Growth Chart --    No data found.  Updated Vital Signs BP 131/83 (BP Location: Right Arm)   Pulse 80   Temp 97.6 F (36.4 C) (Oral)   Resp 18   SpO2 97%   Visual Acuity Right Eye Distance:   Left Eye Distance:   Bilateral Distance:    Right Eye Near:   Left Eye Near:    Bilateral Near:     Physical Exam Vitals and nursing note reviewed.  Constitutional:      General: He is not in acute distress.    Appearance: Normal appearance. He is not ill-appearing or toxic-appearing.  HENT:     Head: Normocephalic and atraumatic.     Right Ear:  Tympanic membrane and ear canal normal.     Left Ear: Tympanic membrane and ear canal normal.     Nose: Congestion  present.     Mouth/Throat:     Mouth: Mucous membranes are moist.     Pharynx: No posterior oropharyngeal erythema.  Eyes:     Pupils: Pupils are equal, round, and reactive to light.  Cardiovascular:     Rate and Rhythm: Normal rate and regular rhythm.     Heart sounds: Normal heart sounds.  Pulmonary:     Effort: Pulmonary effort is normal.     Breath sounds: Normal breath sounds. No wheezing, rhonchi or rales.  Musculoskeletal:     Cervical back: Normal range of motion and neck supple.  Lymphadenopathy:     Cervical: No cervical adenopathy.  Skin:    General: Skin is warm and dry.  Neurological:     General: No focal deficit present.     Mental Status: He is alert and oriented to person, place, and time.  Psychiatric:        Mood and Affect: Mood normal.        Behavior: Behavior normal.      UC Treatments / Results  Labs (all labs ordered are listed, but only abnormal results are displayed) Labs Reviewed - No data to display  EKG   Radiology No results found.  Procedures Procedures (including critical care time)  Medications Ordered in UC Medications - No data to display  Initial Impression / Assessment and Plan / UC Course  I have reviewed the triage vital signs and the nursing notes.  Pertinent labs & imaging results that were available during my care of the patient were reviewed by me and considered in my medical decision making (see chart for details).     Reviewed exam and symptoms with patient.  Patient presenting with 1 week of improving cough without sinus complaints.  Vital signs are stable and he is well-appearing.  Discussed viral bronchitis and continued symptomatic treatment.  Rx Tessalon  refilled sent to his pharmacy.  Encourage rest fluids and PCP follow-up if symptoms do not improve.  ER precautions reviewed. Final Clinical Impressions(s) / UC Diagnoses   Final diagnoses:  Acute viral bronchitis     Discharge Instructions      I have  refilled your Tessalon  Perles to take as needed for your cough.  Continue lots of rest and fluids and follow-up with your PCP if your symptoms do not improve.  Please go to the emergency room for any worsening symptoms.  Hope you feel better soon!    ED Prescriptions     Medication Sig Dispense Auth. Provider   benzonatate  (TESSALON ) 100 MG capsule Take 1 capsule (100 mg total) by mouth 3 (three) times daily as needed for cough. 20 capsule Reina Wilton, Jodi R, NP      PDMP not reviewed this encounter.    [1]  Social History Tobacco Use   Smoking status: Never   Smokeless tobacco: Never  Vaping Use   Vaping status: Never Used  Substance Use Topics   Alcohol use: No   Drug use: Never     [name], NP 10/05/24 [phone]  "

## 2024-10-07 ENCOUNTER — Encounter: Payer: Self-pay | Admitting: Gastroenterology

## 2024-10-12 ENCOUNTER — Telehealth: Payer: Self-pay | Admitting: Gastroenterology

## 2024-10-12 NOTE — Telephone Encounter (Signed)
 Patient is advised and agrees to this plan of care. No further questions at this time.

## 2024-10-12 NOTE — Telephone Encounter (Signed)
 Inbound call from patient stating that  he is scheduled for a colonoscopy on January the 21st. Patient was reading his instruction and wanted to us  to be aware that he did taken a Ibuprofen for a muscle pain he was having. Patient stated that Ibuprofen is 800 MG. Patient has requested a call back to further advise him if this is fine. Please advise.

## 2024-10-12 NOTE — Telephone Encounter (Signed)
 It is ok, thanks  - HD

## 2024-10-12 NOTE — Telephone Encounter (Signed)
 Any concerns?

## 2024-10-14 ENCOUNTER — Ambulatory Visit: Admitting: Gastroenterology

## 2024-10-14 ENCOUNTER — Encounter: Payer: Self-pay | Admitting: Gastroenterology

## 2024-10-14 VITALS — BP 116/70 | HR 68 | Temp 97.9°F | Resp 15 | Ht 68.0 in | Wt 182.0 lb

## 2024-10-14 DIAGNOSIS — Z1211 Encounter for screening for malignant neoplasm of colon: Secondary | ICD-10-CM | POA: Diagnosis not present

## 2024-10-14 DIAGNOSIS — Z800 Family history of malignant neoplasm of digestive organs: Secondary | ICD-10-CM | POA: Diagnosis not present

## 2024-10-14 DIAGNOSIS — K5730 Diverticulosis of large intestine without perforation or abscess without bleeding: Secondary | ICD-10-CM | POA: Diagnosis not present

## 2024-10-14 DIAGNOSIS — K627 Radiation proctitis: Secondary | ICD-10-CM | POA: Diagnosis not present

## 2024-10-14 DIAGNOSIS — K648 Other hemorrhoids: Secondary | ICD-10-CM | POA: Diagnosis not present

## 2024-10-14 DIAGNOSIS — D122 Benign neoplasm of ascending colon: Secondary | ICD-10-CM

## 2024-10-14 DIAGNOSIS — Z860101 Personal history of adenomatous and serrated colon polyps: Secondary | ICD-10-CM | POA: Diagnosis not present

## 2024-10-14 MED ORDER — SODIUM CHLORIDE 0.9 % IV SOLN: 500.0000 mL | Freq: Once | INTRAVENOUS | Status: DC

## 2024-10-14 NOTE — Progress Notes (Signed)
 Vss nad trans to pacu

## 2024-10-14 NOTE — Patient Instructions (Signed)

## 2024-10-14 NOTE — Progress Notes (Signed)
 Called to room to assist during endoscopic procedure.  Patient ID and intended procedure confirmed with present staff. Received instructions for my participation in the procedure from the performing physician.

## 2024-10-14 NOTE — Progress Notes (Signed)
 History and Physical:  This patient presents for endoscopic testing for: Encounter Diagnosis  Name Primary?   Family history of colon cancer in father Yes    79 year old man known to me and here today for a screening colonoscopy with increased risk due to family history of colon cancer in his father.  He had no polyps on the 2020 colonoscopy, and a diminutive tubular adenoma on the 2015 colonoscopy.  Further details of his clinical history and regarding his chronic stable reflux symptoms are outlined in my 09/07/2024 office note.  Patient is otherwise without complaints or active issues today.   Past Medical History: Past Medical History:  Diagnosis Date   Actinic keratoses    Allergy    spring, fall   Cancer [facility])    prostate   Cataract 12/12/2017   right eye forming    Diverticulosis of colon 01/1999   Dysplastic nevus 09/30/2023   left mid back paraspinal, mod to severe, scheduled for excision   GERD (gastroesophageal reflux disease)    History of CT scan 04/1985   L/S, H/P L5/S1   Hyperlipidemia    Medicare annual wellness visit, subsequent 03/20/2023   Prostate cancer (HCC)    Right knee pain    better with hyaluronic acid   Rosacea      Past Surgical History: Past Surgical History:  Procedure Laterality Date   APPENDECTOMY     CATARACT EXTRACTION     COLONOSCOPY     CYSTOSCOPY N/A 07/16/2017   Procedure: CYSTOSCOPY;  Surgeon: Penne Knee, MD;  Location: [facility] ORS;  Service: Urology;  Laterality: N/A;   LUMBAR SPINE SURGERY  1986   LS ruptured disc repair   POLYPECTOMY     RADIOACTIVE SEED IMPLANT N/A 07/16/2017   Procedure: RADIOACTIVE SEED IMPLANT/BRACHYTHERAPY IMPLANT;  Surgeon: Penne Knee, MD;  Location: [facility] ORS;  Service: Urology;  Laterality: N/A;   RADIOACTIVE SEED IMPLANT     Prostate- [facility]   skin excision  2020   multiple    TONSILLECTOMY     UMBILICAL HERNIA REPAIR  10/05/2005     Allergies: Allergies[1]  Outpatient Meds: Current Outpatient Medications  Medication Sig Dispense Refill   aspirin  EC 81 MG tablet Take 1 tablet (81 mg total) by mouth daily. Swallow whole.     atorvastatin  (LIPITOR) 20 MG tablet TAKE 1 TABLET BY MOUTH EVERY DAY 90 tablet 2   Cholecalciferol  50 MCG (2000 UT) TABS Take 2 tablets (4,000 Units total) by mouth daily. 30 tablet    folic acid (FOLVITE) 400 MCG tablet Take 400 mcg by mouth daily.     metroNIDAZOLE  (METROGEL ) 0.75 % gel Apply 1 Application topically daily. 45 g 12   pantoprazole  (PROTONIX ) 20 MG tablet Take 1 tablet (20 mg total) by mouth daily as needed. 90 tablet 3   vitamin B-12 (CYANOCOBALAMIN) 1000 MCG tablet Take 1,000 mcg by mouth daily.     vitamin C (ASCORBIC ACID) 500 MG tablet Take 500 mg by mouth daily.     zinc  gluconate 50 MG tablet Take 50 mg by mouth daily.     benzonatate  (TESSALON ) 100 MG capsule Take 1 capsule (100 mg total) by mouth 3 (three) times daily as needed for cough. (Patient not taking: Reported on 10/14/2024) 20 capsule 0   sildenafil  (REVATIO ) 20 MG tablet TAKE 3-5 TABLETS BY MOUTH DAILY AS NEEDED 50 tablet 12   Current Facility-Administered Medications  Medication Dose Route Frequency Provider Last Rate Last Admin   0.9 %  sodium chloride  infusion  500 mL Intravenous Once Danis, Imad Shostak L III, MD          ___________________________________________________________________ Objective   Exam:  BP (!) 140/75   Pulse 71   Temp 97.9 F (36.6 C) (Skin)   Ht 5' 8 (1.727 m)   Wt 182 lb (82.6 kg)   SpO2 97%   BMI 27.67 kg/m   CV: regular , S1/S2 Resp: clear to auscultation bilaterally, normal RR and effort noted GI: soft, no tenderness, with active bowel sounds.   Assessment: Encounter Diagnosis  Name Primary?   Family history of colon cancer in father Yes     Plan: Colonoscopy   The benefits and risks of the planned procedure(s) were described in detail with the patient or  (when appropriate) their health care proxy.  Risks were outlined as including, but not limited to, bleeding, infection, perforation, adverse medication reaction leading to cardiac or pulmonary decompensation, pancreatitis (if ERCP).  The limitation of incomplete mucosal visualization was also discussed.  No guarantees or warranties were given.  The patient was provided an opportunity to ask questions and all were answered. The patient agreed with the plan.   The patient is appropriate for an endoscopic procedure in the ambulatory setting.   - Victory Brand, MD        [1]  Allergies Allergen Reactions   Omeprazole  Other (See Comments)    Inc in BP.

## 2024-10-14 NOTE — Op Note (Signed)
 [facility] Patient Name: Shane Stewart Procedure Date: 10/14/2024 9:57 AM MRN: [id number] Endoscopist: Victory L. Legrand , MD, [id number] Age: 79 Referring MD:  Date of Birth: 02-22-1946 Gender: Male Account #: [id number] Procedure:                Colonoscopy Indications:              Colon cancer screening in patient at increased                            risk: Colorectal cancer in father                           No polyps in 2020 diminutive tubular adenoma 2015 Medicines:                Monitored Anesthesia Care Procedure:                Pre-Anesthesia Assessment:                           - Prior to the procedure, a History and Physical                            was performed, and patient medications and                            allergies were reviewed. The patient's tolerance of                            previous anesthesia was also reviewed. The risks                            and benefits of the procedure and the sedation                            options and risks were discussed with the patient.                            All questions were answered, and informed consent                            was obtained. Prior Anticoagulants: The patient has                            taken no anticoagulant or antiplatelet agents. ASA                            Grade Assessment: II - A patient with mild systemic                            disease. After reviewing the risks and benefits,                            the patient was deemed in satisfactory condition to  undergo the procedure.                           After obtaining informed consent, the colonoscope                            was passed under direct vision. Throughout the                            procedure, the patient's blood pressure, pulse, and                            oxygen saturations were monitored continuously. The                            CF [id number] #[id number] was  introduced through the anus                            and advanced to the the cecum, identified by                            appendiceal orifice and ileocecal valve. The                            colonoscopy was performed without difficulty. The                            patient tolerated the procedure well. The quality                            of the bowel preparation was excellent. The                            ileocecal valve, appendiceal orifice, and rectum                            were photographed. Scope In: 10:11:20 AM Scope Out: 10:23:53 AM Scope Withdrawal Time: 0 hours 10 minutes 21 seconds  Total Procedure Duration: 0 hours 12 minutes 33 seconds  Findings:                 The perianal and digital rectal examinations were                            normal.                           Repeat examination of right colon under NBI                            performed.                           A diminutive polyp was found in the distal  ascending colon. The polyp was sessile. The polyp                            was removed with a cold snare. Resection and                            retrieval were complete.                           Internal hemorrhoids were found.                           Multiple diverticula were found in the left colon.                           Mild, nonbleeding radiation proctopathy on the                            distal anterior rectal wall                           The exam was otherwise without abnormality on                            direct and retroflexion views. Complications:            No immediate complications. Estimated Blood Loss:     Estimated blood loss was minimal. Impression:               - One diminutive polyp in the distal ascending                            colon, removed with a cold snare. Resected and                            retrieved.                           - Internal hemorrhoids.                            - Diverticulosis in the left colon.                           - The examination was otherwise normal on direct                            and retroflexion views. Recommendation:           - Patient has a contact number available for                            emergencies. The signs and symptoms of potential                            delayed complications were discussed with the  patient. Return to normal activities tomorrow.                            Written discharge instructions were provided to the                            patient.                           - Resume previous diet.                           - Continue present medications.                           - Await pathology results.                           - No repeat routine screening/surveillance                            colonoscopy recommended due to age, current                            guidelines and low risk findings today. Shaliah Wann L. Legrand, MD 10/14/2024 10:29:57 AM This report has been signed electronically.

## 2024-10-15 ENCOUNTER — Telehealth: Payer: Self-pay | Admitting: *Deleted

## 2024-10-15 NOTE — Telephone Encounter (Signed)
 Post procedure follow up call placed, no answer and left VM.

## 2024-10-19 LAB — SURGICAL PATHOLOGY

## 2024-10-21 ENCOUNTER — Ambulatory Visit: Payer: Self-pay | Admitting: Gastroenterology

## 2024-10-21 ENCOUNTER — Encounter: Payer: Self-pay | Admitting: Family Medicine

## 2025-01-19 ENCOUNTER — Ambulatory Visit

## 2025-04-06 ENCOUNTER — Encounter: Admitting: Family Medicine

## 2025-10-06 ENCOUNTER — Ambulatory Visit: Admitting: Dermatology
